# Patient Record
Sex: Male | Born: 1935
Health system: Southern US, Community
[De-identification: ages and names within clinical notes are randomized; demographics above are authoritative.]

## PROBLEM LIST (undated history)

## (undated) DIAGNOSIS — E739 Lactose intolerance, unspecified: Secondary | ICD-10-CM

## (undated) DIAGNOSIS — Z86010 Personal history of colonic polyps: Secondary | ICD-10-CM

## (undated) DIAGNOSIS — N400 Enlarged prostate without lower urinary tract symptoms: Secondary | ICD-10-CM

## (undated) DIAGNOSIS — H409 Unspecified glaucoma: Secondary | ICD-10-CM

## (undated) DIAGNOSIS — K2100 Gastro-esophageal reflux disease with esophagitis, without bleeding: Secondary | ICD-10-CM

## (undated) DIAGNOSIS — K222 Esophageal obstruction: Secondary | ICD-10-CM

## (undated) DIAGNOSIS — Z8739 Personal history of other diseases of the musculoskeletal system and connective tissue: Secondary | ICD-10-CM

## (undated) DIAGNOSIS — J189 Pneumonia, unspecified organism: Secondary | ICD-10-CM

## (undated) DIAGNOSIS — I10 Essential (primary) hypertension: Secondary | ICD-10-CM

## (undated) DIAGNOSIS — K449 Diaphragmatic hernia without obstruction or gangrene: Secondary | ICD-10-CM

## (undated) DIAGNOSIS — I509 Heart failure, unspecified: Secondary | ICD-10-CM

## (undated) DIAGNOSIS — I4891 Unspecified atrial fibrillation: Secondary | ICD-10-CM

## (undated) DIAGNOSIS — K589 Irritable bowel syndrome without diarrhea: Secondary | ICD-10-CM

## (undated) DIAGNOSIS — E538 Deficiency of other specified B group vitamins: Secondary | ICD-10-CM

## (undated) DIAGNOSIS — K219 Gastro-esophageal reflux disease without esophagitis: Secondary | ICD-10-CM

## (undated) DIAGNOSIS — J309 Allergic rhinitis, unspecified: Secondary | ICD-10-CM

## (undated) DIAGNOSIS — E785 Hyperlipidemia, unspecified: Secondary | ICD-10-CM

## (undated) DIAGNOSIS — K7689 Other specified diseases of liver: Secondary | ICD-10-CM

## (undated) DIAGNOSIS — G4730 Sleep apnea, unspecified: Secondary | ICD-10-CM

## (undated) HISTORY — DX: Irritable bowel syndrome, unspecified: K58.9

## (undated) HISTORY — DX: Allergic rhinitis, unspecified: J30.9

## (undated) HISTORY — DX: Esophageal obstruction: K22.2

## (undated) HISTORY — DX: Personal history of colonic polyps: Z86.010

## (undated) HISTORY — DX: Gastro-esophageal reflux disease with esophagitis, without bleeding: K21.00

## (undated) HISTORY — DX: Benign prostatic hyperplasia without lower urinary tract symptoms: N40.0

## (undated) HISTORY — DX: Unspecified atrial fibrillation: I48.91

## (undated) HISTORY — DX: Diaphragmatic hernia without obstruction or gangrene: K44.9

## (undated) HISTORY — DX: Lactose intolerance, unspecified: E73.9

## (undated) HISTORY — PX: CATARACT EXTRACTION W/ INTRAOCULAR LENS IMPLANT: SHX1309

## (undated) HISTORY — DX: Gastro-esophageal reflux disease with esophagitis: K21.0

## (undated) HISTORY — DX: Essential (primary) hypertension: I10

## (undated) HISTORY — DX: Deficiency of other specified B group vitamins: E53.8

## (undated) HISTORY — PX: NASAL RECONSTRUCTION: SHX2069

## (undated) HISTORY — DX: Heart failure, unspecified: I50.9

## (undated) HISTORY — DX: Other specified diseases of liver: K76.89

---

## 1992-03-04 DIAGNOSIS — Z860100 Personal history of colon polyps, unspecified: Secondary | ICD-10-CM

## 1992-03-04 HISTORY — DX: Personal history of colonic polyps: Z86.010

## 1992-03-04 HISTORY — DX: Personal history of colon polyps, unspecified: Z86.0100

## 1998-07-15 ENCOUNTER — Ambulatory Visit (HOSPITAL_COMMUNITY): Admission: RE | Admit: 1998-07-15 | Discharge: 1998-07-15 | Payer: Self-pay | Admitting: Hematology and Oncology

## 2004-12-24 ENCOUNTER — Encounter: Admission: RE | Admit: 2004-12-24 | Discharge: 2004-12-24 | Payer: Self-pay | Admitting: Otolaryngology

## 2005-01-19 ENCOUNTER — Ambulatory Visit: Payer: Self-pay | Admitting: Gastroenterology

## 2005-01-20 ENCOUNTER — Ambulatory Visit: Payer: Self-pay | Admitting: Gastroenterology

## 2005-01-20 ENCOUNTER — Ambulatory Visit (HOSPITAL_COMMUNITY): Admission: RE | Admit: 2005-01-20 | Discharge: 2005-01-20 | Payer: Self-pay | Admitting: Gastroenterology

## 2007-08-15 ENCOUNTER — Encounter (INDEPENDENT_AMBULATORY_CARE_PROVIDER_SITE_OTHER): Payer: Self-pay | Admitting: Urology

## 2007-08-16 ENCOUNTER — Inpatient Hospital Stay (HOSPITAL_COMMUNITY): Admission: RE | Admit: 2007-08-16 | Discharge: 2007-08-17 | Payer: Self-pay | Admitting: Urology

## 2007-10-02 HISTORY — PX: TRANSURETHRAL RESECTION OF PROSTATE: SHX73

## 2007-11-15 ENCOUNTER — Emergency Department (HOSPITAL_COMMUNITY): Admission: EM | Admit: 2007-11-15 | Discharge: 2007-11-15 | Payer: Self-pay | Admitting: *Deleted

## 2009-04-14 ENCOUNTER — Encounter: Admission: RE | Admit: 2009-04-14 | Discharge: 2009-04-14 | Payer: Self-pay | Admitting: Family Medicine

## 2009-04-18 ENCOUNTER — Encounter: Admission: RE | Admit: 2009-04-18 | Discharge: 2009-04-18 | Payer: Self-pay | Admitting: Family Medicine

## 2010-02-25 ENCOUNTER — Encounter (INDEPENDENT_AMBULATORY_CARE_PROVIDER_SITE_OTHER): Payer: Self-pay | Admitting: *Deleted

## 2010-02-25 ENCOUNTER — Encounter: Payer: Self-pay | Admitting: Gastroenterology

## 2010-02-25 ENCOUNTER — Encounter: Admission: RE | Admit: 2010-02-25 | Discharge: 2010-02-25 | Payer: Self-pay | Admitting: Family Medicine

## 2010-06-16 ENCOUNTER — Encounter: Payer: Self-pay | Admitting: Gastroenterology

## 2010-07-08 ENCOUNTER — Encounter: Payer: Self-pay | Admitting: Gastroenterology

## 2010-10-14 ENCOUNTER — Encounter: Payer: Self-pay | Admitting: Gastroenterology

## 2010-11-19 DIAGNOSIS — E785 Hyperlipidemia, unspecified: Secondary | ICD-10-CM | POA: Insufficient documentation

## 2010-11-19 DIAGNOSIS — I119 Hypertensive heart disease without heart failure: Secondary | ICD-10-CM | POA: Insufficient documentation

## 2010-11-19 DIAGNOSIS — K2100 Gastro-esophageal reflux disease with esophagitis, without bleeding: Secondary | ICD-10-CM | POA: Insufficient documentation

## 2010-11-19 DIAGNOSIS — J309 Allergic rhinitis, unspecified: Secondary | ICD-10-CM | POA: Insufficient documentation

## 2010-11-19 DIAGNOSIS — K589 Irritable bowel syndrome without diarrhea: Secondary | ICD-10-CM | POA: Insufficient documentation

## 2010-11-19 DIAGNOSIS — I110 Hypertensive heart disease with heart failure: Secondary | ICD-10-CM | POA: Insufficient documentation

## 2010-11-20 ENCOUNTER — Encounter (INDEPENDENT_AMBULATORY_CARE_PROVIDER_SITE_OTHER): Payer: Self-pay | Admitting: *Deleted

## 2010-11-20 ENCOUNTER — Other Ambulatory Visit: Payer: Self-pay | Admitting: Gastroenterology

## 2010-11-20 ENCOUNTER — Ambulatory Visit
Admission: RE | Admit: 2010-11-20 | Discharge: 2010-11-20 | Payer: Self-pay | Source: Home / Self Care | Attending: Gastroenterology | Admitting: Gastroenterology

## 2010-11-20 DIAGNOSIS — Z860100 Personal history of colon polyps, unspecified: Secondary | ICD-10-CM | POA: Insufficient documentation

## 2010-11-20 LAB — BASIC METABOLIC PANEL
BUN: 8 mg/dL (ref 6–23)
CO2: 34 mEq/L — ABNORMAL HIGH (ref 19–32)
Calcium: 9.3 mg/dL (ref 8.4–10.5)
Chloride: 106 mEq/L (ref 96–112)
Creatinine, Ser: 1 mg/dL (ref 0.4–1.5)
GFR: 93.77 mL/min (ref >60.00)
Glucose, Bld: 73 mg/dL (ref 70–99)
Potassium: 4.1 mEq/L (ref 3.5–5.1)
Sodium: 144 mEq/L (ref 135–145)

## 2010-11-20 LAB — IBC PANEL
Iron: 150 ug/dL (ref 42–165)
Saturation Ratios: 50 % (ref 20.0–50.0)
Transferrin: 214.3 mg/dL (ref 212.0–360.0)

## 2010-11-20 LAB — CBC WITH DIFFERENTIAL/PLATELET
Basophils Absolute: 0 10*3/uL (ref 0.0–0.1)
Basophils Relative: 0.3 % (ref 0.0–3.0)
Eosinophils Absolute: 0.1 10*3/uL (ref 0.0–0.7)
Eosinophils Relative: 1.7 % (ref 0.0–5.0)
HCT: 45.7 % (ref 39.0–52.0)
Hemoglobin: 15.6 g/dL (ref 13.0–17.0)
Lymphocytes Relative: 33.1 % (ref 12.0–46.0)
Lymphs Abs: 1.3 10*3/uL (ref 0.7–4.0)
MCHC: 34.2 g/dL (ref 30.0–36.0)
MCV: 88.3 fl (ref 78.0–100.0)
Monocytes Absolute: 0.4 10*3/uL (ref 0.1–1.0)
Monocytes Relative: 9.7 % (ref 3.0–12.0)
Neutro Abs: 2.1 10*3/uL (ref 1.4–7.7)
Neutrophils Relative %: 55.2 % (ref 43.0–77.0)
Platelets: 160 10*3/uL (ref 150.0–400.0)
RBC: 5.18 Mil/uL (ref 4.22–5.81)
RDW: 12.3 % (ref 11.5–14.6)
WBC: 3.8 10*3/uL — ABNORMAL LOW (ref 4.5–10.5)

## 2010-11-20 LAB — SEDIMENTATION RATE: Sed Rate: 5 mm/hr (ref 0–22)

## 2010-11-20 LAB — LIPASE: Lipase: 25 U/L (ref 11.0–59.0)

## 2010-11-20 LAB — HEPATIC FUNCTION PANEL
ALT: 15 U/L (ref 0–53)
AST: 17 U/L (ref 0–37)
Albumin: 3.9 g/dL (ref 3.5–5.2)
Alkaline Phosphatase: 68 U/L (ref 39–117)
Bilirubin, Direct: 0.2 mg/dL (ref 0.0–0.3)
Total Bilirubin: 1.2 mg/dL (ref 0.3–1.2)
Total Protein: 6.6 g/dL (ref 6.0–8.3)

## 2010-11-20 LAB — FOLATE: Folate: 14.9 ng/mL (ref >5.9)

## 2010-11-20 LAB — AMYLASE: Amylase: 55 U/L (ref 27–131)

## 2010-11-20 LAB — FERRITIN: Ferritin: 212.3 ng/mL (ref 22.0–322.0)

## 2010-11-20 LAB — VITAMIN B12: Vitamin B-12: 127 pg/mL — ABNORMAL LOW (ref 211–911)

## 2010-11-20 LAB — TSH: TSH: 1.01 u[IU]/mL (ref 0.35–5.50)

## 2010-11-20 LAB — H. PYLORI ANTIBODY, IGG: H Pylori IgG: NEGATIVE

## 2010-11-20 LAB — MAGNESIUM: Magnesium: 2 mg/dL (ref 1.5–2.5)

## 2010-11-23 ENCOUNTER — Encounter: Payer: Self-pay | Admitting: Family Medicine

## 2010-11-23 DIAGNOSIS — E538 Deficiency of other specified B group vitamins: Secondary | ICD-10-CM | POA: Insufficient documentation

## 2010-11-24 ENCOUNTER — Ambulatory Visit
Admission: RE | Admit: 2010-11-24 | Discharge: 2010-11-24 | Payer: Self-pay | Source: Home / Self Care | Attending: Gastroenterology | Admitting: Gastroenterology

## 2010-11-27 ENCOUNTER — Ambulatory Visit
Admission: RE | Admit: 2010-11-27 | Discharge: 2010-11-27 | Payer: Self-pay | Source: Home / Self Care | Attending: Gastroenterology | Admitting: Gastroenterology

## 2010-11-27 ENCOUNTER — Encounter: Payer: Self-pay | Admitting: Gastroenterology

## 2010-12-03 NOTE — Letter (Signed)
Summary: New Patient letter  [facility]  [address] [place], [place] [address]   Phone: [phone]  Fax: [phone]       10/14/2010 MRN: [id number]  [facility] [address] [place], [place]  [address]  Dear Ronald [name],  Welcome to the Gastroenterology Division at [facility].    You are scheduled to see Dr.  Jarold Motto on 11-20-2010 at 9am on the 3rd floor at [facility], [address]  We ask that you try to arrive at our office 15 minutes prior to your appointment time to allow for check-in.  We would like you to complete the enclosed self-administered evaluation form prior to your visit and bring it with you on the day of your appointment.  We will review it with you.  Also, please bring a complete list of all your medications or, if you prefer, bring the medication bottles and we will list them.  Please bring your insurance card so that we may make a copy of it.  If your insurance requires a referral to see a specialist, please bring your referral form from your primary care physician.  Co-payments are due at the time of your visit and may be paid by cash, check or credit card.     Your office visit will consist of a consult with your physician (includes a physical exam), any laboratory testing he/she may order, scheduling of any necessary diagnostic testing (e.g. x-ray, ultrasound, CT-scan), and scheduling of a procedure (e.g. Endoscopy, Colonoscopy) if required.  Please allow enough time on your schedule to allow for any/all of these possibilities.    If you cannot keep your appointment, please call [phone] to cancel or reschedule prior to your appointment date.  This allows [place] the opportunity to schedule an appointment for another patient in need of care.  If you do not cancel or reschedule by 5 p.m. the business day prior to your appointment date, you will be charged a $50.00 late cancellation/no-show fee.    Thank you for choosing [facility] for your medical needs.  We appreciate the opportunity to care for you.  Please visit [place] at our website  to learn more about our practice.                     Sincerely,                                                             The Gastroenterology Division

## 2010-12-03 NOTE — Procedures (Signed)
Summary: Colonoscopy   Colonoscopy  Procedure date:  01/20/2005  Findings:      Location:  [facility].    Procedures Next Due Date:    Colonoscopy: 01/2010 Patient Name: Ronald [name], Ronald [name] MRN: [id number] Procedure Procedures: Colonoscopy CPT: 62130.  Personnel: Endoscopist: Vania Rea. Jarold Motto, MD.  Exam Location: Exam performed in Endoscopy Suite.  Patient Consent: Procedure, Alternatives, Risks and Benefits discussed, consent obtained,  Indications  Surveillance of: Adenomatous Polyp(s).  History  Current Medications: Patient is not currently taking Coumadin.  Pre-Exam Physical: Performed Jan 20, 2005. Cardio-pulmonary exam, Rectal exam, Abdominal exam, Extremity exam, Mental status exam WNL.  Exam Exam: Extent of exam reached: Cecum, extent intended: Cecum.  The cecum was identified by appendiceal orifice and IC valve. Patient position: on left side. Duration of exam: 20 minutes. Colon retroflexion performed. Images taken. ASA Classification: I. Tolerance: excellent.  Monitoring: Pulse and BP monitoring, Oximetry used. Supplemental O2 given. at 2 Liters.  Colon Prep Used Golytely for colon prep. Prep results: excellent.  Fluoroscopy: Fluoroscopy was not used.  Sedation Meds: Fentanyl 50 mcg. given IV. Versed 5 mg. given IV.  Instrument(s): PCF 140L. Serial [id number].  Findings - NORMAL EXAM: Cecum to Rectum. Not Seen: Polyps. AVM's. Colitis. Tumors. Melanosis. Crohn's. Diverticulosis. Hemorrhoids.   Assessment Normal examination.  Events  Unplanned Interventions: No intervention was required.  Plans Medication Plan: Referring provider to order medications.  Patient Education: Patient given standard instructions for: Patient instructed to get routine colonoscopy every 5 years.  Disposition: After procedure patient sent to recovery.  Scheduling/Referral: Follow-Up prn.    CC: Delorse Lek, MD  This report was created  from the original endoscopy report, which was reviewed and signed by the above listed endoscopist.

## 2010-12-03 NOTE — Letter (Signed)
Summary: [facility] Instructions  [facility]  [address] [place], [place] [address]   Phone: [phone]  Fax: [phone]       DEVAUN HERNANDEZ    [birth year]    MRN: [id number]        Procedure Day /Date: Friday 11/27/2010     Arrival Time: 1:30pm     Procedure Time: 2:30pm     Location of Procedure:                    X  [facility] (4th Floor)   PREPARATION FOR COLONOSCOPY WITH MOVIPREP   Starting 5 days prior to your procedure 11/22/2010 do not eat nuts, seeds, popcorn, corn, beans, peas,  salads, or any raw vegetables.  Do not take any fiber supplements (e.g. Metamucil, Citrucel, and Benefiber).  THE DAY BEFORE YOUR PROCEDURE        Thursday 11/26/2010  1.  Drink clear liquids the entire day-NO SOLID FOOD  2.  Do not drink anything colored red or purple.  Avoid juices with pulp.  No orange juice.  3.  Drink at least 64 oz. (8 glasses) of fluid/clear liquids during the day to prevent dehydration and help the prep work efficiently.  CLEAR LIQUIDS INCLUDE: Water Jello Ice Popsicles Tea (sugar ok, no milk/cream) Powdered fruit flavored drinks Coffee (sugar ok, no milk/cream) Gatorade Juice: apple, white grape, white cranberry  Lemonade Clear bullion, consomm, broth Carbonated beverages (any kind) Strained chicken noodle soup Hard Candy                             4.  In the morning, mix first dose of MoviPrep solution:    Empty 1 Pouch A and 1 Pouch B into the disposable container    Add lukewarm drinking water to the top line of the container. Mix to dissolve    Refrigerate (mixed solution should be used within 24 hrs)  5.  Begin drinking the prep at 5:00 p.m. The MoviPrep container is divided by 4 marks.   Every 15 minutes drink the solution down to the next mark (approximately 8 oz) until the full liter is complete.   6.  Follow completed prep with 16 oz of clear liquid of your choice (Nothing red or purple).  Continue to drink  clear liquids until bedtime.  7.  Before going to bed, mix second dose of MoviPrep solution:    Empty 1 Pouch A and 1 Pouch B into the disposable container    Add lukewarm drinking water to the top line of the container. Mix to dissolve    Refrigerate  THE DAY OF YOUR PROCEDURE     Friday 11/27/2010  Beginning at 9:30am (5 hours before procedure):         1. Every 15 minutes, drink the solution down to the next mark (approx 8 oz) until the full liter is complete.  2. Follow completed prep with 16 oz. of clear liquid of your choice.    3. You may drink clear liquids until 12:30am (2 HOURS BEFORE PROCEDURE).   MEDICATION INSTRUCTIONS  Unless otherwise instructed, you should take regular prescription medications with a small sip of water   as early as possible the morning of your procedure.          OTHER INSTRUCTIONS  You will need a responsible adult at least 75 years of age to accompany you and drive you home.  This person must remain in the waiting room during your procedure.  Wear loose fitting clothing that is easily removed.  Leave jewelry and other valuables at home.  However, you may wish to bring a book to read or  an iPod/MP3 player to listen to music as you wait for your procedure to start.  Remove all body piercing jewelry and leave at home.  Total time from sign-in until discharge is approximately 2-3 hours.  You should go home directly after your procedure and rest.  You can resume normal activities the  day after your procedure.  The day of your procedure you should not:   Drive   Make legal decisions   Operate machinery   Drink alcohol   Return to work  You will receive specific instructions about eating, activities and medications before you leave.    The above instructions have been reviewed and explained to me by   _______________________    I fully understand and can verbalize these instructions _____________________________ Date  _________

## 2010-12-03 NOTE — Assessment & Plan Note (Signed)
Summary: IBS,INCREASED GAS & NAUSEA/YF   History of Present Illness Visit Type: Initial Consult Primary GI MD: Sheryn Bison MD FACP FAGA Primary Provider: Warrick Parisian, MD Requesting Provider: Warrick Parisian, MD Chief Complaint: Increase in gas, bloating, reflux symptoms x 3months. Pt states he has a little nagging discomfort in right side of the abdomen and ribs. Pt has a sour taste in his mouth.  History of Present Illness:   77 year old [place] American male that I have followed for many years 77 year-old bowel syndrome. He now is having a relapse of his problems with crampy lower abdominal pain, gas and bloating unresponsive to Dexilant 60 mg a day. Recent evaluation by Dr. Creta Levin showed normal labs and upper abdominal ultrasound exam.  Tailor has mild lactose intolerance. He denies sorbitol or fructose use. His bowels are fairly regular without melena or hematochezia. He has had no anorexia, weight loss, fever or chills. I cannot elicit any upper GI complaints at this time. He specifically denies acid reflux, dysphagia, or any history of hepatitis or pancreatitis. Review his ultrasound shows normal abdominal structures. Liver function test were normal. He does have mild essential hypertension and hyperlipidemia.  He has a history of adenomatous polyps going back some 20 years. Last colonoscopy was 5 years ago and was entirely normal.   GI Review of Systems    Reports abdominal pain, acid reflux, belching, and  bloating.     Location of  Abdominal pain: right side.    Denies chest pain, dysphagia with liquids, dysphagia with solids, heartburn, loss of appetite, nausea, vomiting, vomiting blood, weight loss, and  weight gain.        Denies anal fissure, black tarry stools, change in bowel habit, constipation, diarrhea, diverticulosis, fecal incontinence, heme positive stool, hemorrhoids, irritable bowel syndrome, jaundice, light color stool, liver problems, rectal bleeding, and   rectal pain.    Current Medications (verified): 1)  Atenolol 50 Mg Tabs (Atenolol) .... Take One By Mouth Once Daily 2)  Potassium Chloride Crys Cr 20 Meq Cr-Tabs (Potassium Chloride Crys Cr) .... Take One By Mouth Once Daily 3)  Multivitamins  Tabs (Multiple Vitamin) .... Take One By Mouth Once Daily 4)  Amlodipine Besylate 5 Mg Tabs (Amlodipine Besylate) .... Take One By Mouth Once Daily 5)  Dexilant 60 Mg Cpdr (Dexlansoprazole) .... Take One By Mouth Once Daily  Allergies (verified): No Known Drug Allergies  Past History:  Past medical, surgical, family and social histories (including risk factors) reviewed for relevance to current acute and chronic problems.  Past Medical History: IRRITABLE BOWEL SYNDROME (ICD-564.1) HYPERLIPIDEMIA (ICD-272.4) REFLUX ESOPHAGITIS (ICD-530.11) HYPERTENSION, BENIGN (ICD-401.1) ALLERGIC RHINITIS (ICD-477.9)  Past Surgical History: Reviewed history from 11/19/2010 and no changes required. nose surgery prostate surgery  Family History: Reviewed history from 11/19/2010 and no changes required. Unremarkable No FH of Colon Cancer:  Social History: Reviewed history from 11/19/2010 and no changes required. Married Patient has never smoked.  Alcohol Use - yes Patient gets regular exercise.  Review of Systems       The patient complains of allergy/sinus, arthritis/joint pain, back pain, cough, night sweats, and swelling of feet/legs.  The patient denies anemia, anxiety-new, blood in urine, breast changes/lumps, change in vision, confusion, coughing up blood, depression-new, fainting, fatigue, fever, headaches-new, hearing problems, heart murmur, heart rhythm changes, itching, menstrual pain, muscle pains/cramps, nosebleeds, pregnancy symptoms, shortness of breath, skin rash, sleeping problems, sore throat, swollen lymph glands, thirst - excessive , urination - excessive , urination changes/pain, urine leakage, vision  changes, and voice change.          Vague periodic right upper quadrant discomfort noted without any definite hepatobiliary complaints. He denies use of NSAIDs or aspirin.  Vital Signs:  Patient profile:   77 year old male Height:      74 inches Weight:      223.25 pounds BMI:     28.77 Pulse rate:   64 / minute Pulse rhythm:   regular BP sitting:   138 / 78  (left arm) Cuff size:   regular  Vitals Entered By: Christie Nottingham CMA Duncan Dull) (November 20, 2010 8:44 AM)  Physical Exam  General:  Well developed, well nourished, no acute distress.healthy appearing.   Head:  Normocephalic and atraumatic. Eyes:  PERRLA, no icterus.exam deferred to patient's ophthalmologist.   Neck:  Supple; no masses or thyromegaly. Lungs:  Clear throughout to auscultation. Heart:  Regular rate and rhythm; no murmurs, rubs,  or bruits. Abdomen:  Soft, nontender and nondistended. No masses, hepatosplenomegaly or hernias noted. Normal bowel sounds. Rectal:  deferred until time of colonoscopy.   Prostate:  Exam deferred until time of colonoscopy.   Msk:  Symmetrical with no gross deformities. Normal posture. Extremities:  No clubbing, cyanosis, edema or deformities noted. Neurologic:  Alert and  oriented x4;  grossly normal neurologically. Cervical Nodes:  No significant cervical adenopathy. Psych:  Alert and cooperative. Normal mood and affect.   Impression & Recommendations:  Problem # 1:  COLONIC POLYPS, HX OF (ICD-V12.72) Assessment Unchanged Colonoscopy scheduled at his convenience. Orders: TLB-CBC Platelet - w/Differential (85025-CBCD) TLB-BMP (Basic Metabolic Panel-BMET) (80048-METABOL) TLB-Hepatic/Liver Function Pnl (80076-HEPATIC) TLB-TSH (Thyroid Stimulating Hormone) (84443-TSH) TLB-B12, Serum-Total ONLY ([address]-U04) TLB-Ferritin (82728-FER) TLB-Folic Acid (Folate) (82746-FOL) TLB-IBC Pnl (Iron/FE;Transferrin) (83550-IBC) TLB-Amylase (82150-AMYL) TLB-Lipase (83690-LIPASE) TLB-H. Pylori Abs(Helicobacter Pylori)  (86677-HELICO) TLB-Magnesium (Mg) (83735-MG) TLB-Sedimentation Rate (ESR) (85652-ESR) Colon/Endo (Colon/Endo)  Problem # 2:  IRRITABLE BOWEL SYNDROME (ICD-564.1) Assessment: Deteriorated Trial of Librax one p.o. t.i.d. a.c. Also trial of probiotic therapy with daily Align.He has been on Dexilant for several weeks without improvement and we will discontinue this PPI therapy. Labs pending. Orders: TLB-CBC Platelet - w/Differential (85025-CBCD) TLB-BMP (Basic Metabolic Panel-BMET) (80048-METABOL) TLB-Hepatic/Liver Function Pnl (80076-HEPATIC) TLB-TSH (Thyroid Stimulating Hormone) (84443-TSH) TLB-B12, Serum-Total ONLY (54098-J19) TLB-Ferritin (82728-FER) TLB-Folic Acid (Folate) (82746-FOL) TLB-IBC Pnl (Iron/FE;Transferrin) (83550-IBC) TLB-Amylase (82150-AMYL) TLB-Lipase (83690-LIPASE) TLB-H. Pylori Abs(Helicobacter Pylori) (86677-HELICO) TLB-Magnesium (Mg) (83735-MG) TLB-Sedimentation Rate (ESR) (85652-ESR) Colon/Endo (Colon/Endo)  Problem # 3:  HYPERTENSION, BENIGN (ICD-401.1) Assessment: Improved blood pressure That today normal at 138/78. His continue other medications per primary care.  Patient Instructions: 1)  Copy sent to : Warrick Parisian, MD 2)  Your procedure has been scheduled for 11/27/2010, please follow the seperate instructions.  3)  [facility] Patient Information Guide given to patient.  4)  Colonoscopy and Flexible Sigmoidoscopy brochure given.  5)  Stop your Dexilant. 6)  Your prescription(s) have been sent to you pharmacy.  7)  Please go to the basement today for your labs.  8)  The medication list was reviewed and reconciled.  All changed / newly prescribed medications were explained.  A complete medication list was provided to the patient / caregiver. Prescriptions: MOVIPREP 100 GM  SOLR (PEG-KCL-NACL-NASULF-NA ASC-C) As per prep instructions.  #1 x 0   Entered by:   Harlow Mares CMA (AAMA)   Authorized by:   Mardella Layman MD [facility]   Signed  by:   Harlow Mares CMA (AAMA) on 11/20/2010   Method used:   Electronically  to        [facility] [address]* (retail)       [address]       [place], [place]  [address]       Ph: [phone]       Fax: [phone]   RxID:   [id number] LIBRAX 2.5-5 MG CAPS (CLIDINIUM-CHLORDIAZEPOXIDE) take one by mouth three times a day before meals  #90 x 3   Entered by:   Harlow Mares CMA (AAMA)   Authorized by:   Mardella Layman MD [facility]   Signed by:   Harlow Mares CMA (AAMA) on 11/20/2010   Method used:   Electronically to        [facility]* (retail)       [address]       [place], [place]  [address]       Ph: [phone]       Fax: [phone]   RxID:   [id number]

## 2010-12-03 NOTE — Assessment & Plan Note (Signed)
Summary: 1 of 3 b12 injections/lk  Nurse Visit   Allergies: No Known Drug Allergies  Medication Administration  Injection # 1:    Medication: Vit B12 1000 mcg    Diagnosis: VITAMIN B12 DEFICIENCY (ICD-266.2)    Route: IM    Site: L deltoid    Exp Date: 08/01/2012    Lot #: [id number]    Mfr: American Regent    Comments: #1 of 3 B 12 injections    Patient tolerated injection without complications    Given by: Jesse Fall RN (November 24, 2010 9:10 AM)  Orders Added: 1)  Vit B12 1000 mcg [J3420]

## 2010-12-03 NOTE — Procedures (Signed)
Summary: Endoscopy   EGD  Procedure date:  01/20/2005  Findings:      Location:  Endoscopy Center   Patient Name: Ronald [name], Ronald [name] MRN: [id number] Procedure Procedures: Panendoscopy (EGD) CPT: 43235.    with biopsy(s)/brushing(s). CPT: D1846139.  Personnel: Endoscopist: Vania Rea. Jarold Motto, MD.  Exam Location: Exam performed in Endoscopy Suite.  Patient Consent: Procedure, Alternatives, Risks and Benefits discussed, consent obtained,  Indications Symptoms: Reflux symptoms for >10 yrs, occurring 3-6 times/wk.  History  Current Medications: Patient is not currently taking Coumadin.  Pre-Exam Physical: Performed Jan 20, 2005  Cardio-pulmonary exam, Abdominal exam, Extremity exam, Mental status exam WNL.  Exam Exam Info: Maximum depth of insertion Duodenum, intended Duodenum. Patient position: on left side. Duration of exam: 15 minutes. Vocal cords visualized. Gastric retroflexion performed. Images taken. ASA Classification: I. Tolerance: excellent.  Sedation Meds: Cetacaine Spray 2 sprays given aerosolized. Fentanyl 12.5 given IV. Versed 1 mg. given IV.  Monitoring: BP and pulse monitoring done. Oximetry used. Supplemental O2 given at 2 Liters.  Fluoroscopy: Fluoroscopy was not used.  Instrument(s): GIF 160. Serial [id number].   Findings - Normal: Proximal Esophagus to Distal Esophagus. Not Seen: Tumor. Barrett's esophagus. Esophageal inflammation. Mucosal abnormality. Stricture. Varices.  - Normal: Fundus to Duodenal 2nd Portion. Tumor. Ulcer. Mucosal abnormality. Foreign body.  - DIAGNOSTIC TEST: from Antrum. RUT done, results pending   Assessment Normal examination.  Events  Unplanned Intervention: No unplanned interventions were required.  Plans Medication(s): Await pathology. Continue current medications.  Disposition: After procedure patient sent to recovery.  Scheduling: Follow-up prn.   CC: Delorse Lek, MD  This report was  created from the original endoscopy report, which was reviewed and signed by the above listed endoscopist.    RUT=NEG

## 2010-12-03 NOTE — Letter (Signed)
[facility]   Imported By: Sherian Rein 11/26/2010 07:25:31  _____________________________________________________________________  External Attachment:    Type:   Image     Comment:   External Document

## 2010-12-03 NOTE — Procedures (Addendum)
Summary: Colonoscopy  Patient: Giovoni Bunch Note: All result statuses are Final unless otherwise noted.  Tests: (1) Colonoscopy (COL)   COL Colonoscopy           DONE     [facility]     [address]     [place], [place]  [address]           COLONOSCOPY PROCEDURE REPORT           PATIENT:  Ronald [name], Ronald [name]  MR#:  [id number]     BIRTHDATE:  May 16, 1934, 76 yrs. old  GENDER:  male     ENDOSCOPIST:  Vania Rea. Jarold Motto, MD, [facility]     REF. BY:  Talbot Grumbling. Creta Levin, M.D.     PROCEDURE DATE:  11/27/2010     PROCEDURE:  Average-risk screening colonoscopy     G0121     ASA CLASS:  Class II     INDICATIONS:  Abdominal pain, Routine Risk Screening, history of     pre-cancerous (adenomatous) colon polyps     MEDICATIONS:   Fentanyl 25 mcg IV, Versed 4 mg IV           DESCRIPTION OF PROCEDURE:   After the risks benefits and     alternatives of the procedure were thoroughly explained, informed     consent was obtained.  Digital rectal exam was performed and     revealed no abnormalities.   The LB CF-[id number] endoscope     was introduced through the anus and advanced to the cecum, which     was identified by both the appendix and ileocecal valve, without     limitations.  The quality of the prep was excellent, using     MoviPrep.  The instrument was then slowly withdrawn as the colon     was fully examined.     <<PROCEDUREIMAGES>>           FINDINGS:  No polyps or cancers were seen.  This was otherwise a     normal examination of the colon.   Retroflexed views in the rectum     revealed no abnormalities.    The scope was then withdrawn from     the patient and the procedure completed.           COMPLICATIONS:  None     ENDOSCOPIC IMPRESSION:     1) No polyps or cancers     2) Otherwise normal examination     CHRONIC IBS.LACTOSE INTOLERANCE.     RECOMMENDATIONS:     1) Continue current medications     REPEAT EXAM:  No           ______________________________     Vania Rea. Jarold Motto, MD, [facility]           CC:           n.     eSIGNED:   Vania Rea. Patterson at 11/27/2010 02:55 PM           Fanny Bien, [id number]  Note: An exclamation mark (!) indicates a result that was not dispersed into the flowsheet. Document Creation Date: 11/27/2010 2:55 PM _______________________________________________________________________  (1) Order result status: Final Collection or observation date-time: 11/27/2010 14:48 Requested date-time:  Receipt date-time:  Reported date-time:  Referring Physician:   Ordering Physician: Sheryn Bison [phone]) Specimen Source:  Source: Launa Grill Order Number: [id number] Lab site:

## 2010-12-25 ENCOUNTER — Encounter: Payer: Self-pay | Admitting: Gastroenterology

## 2010-12-25 ENCOUNTER — Encounter (INDEPENDENT_AMBULATORY_CARE_PROVIDER_SITE_OTHER): Payer: PRIVATE HEALTH INSURANCE

## 2010-12-25 DIAGNOSIS — E538 Deficiency of other specified B group vitamins: Secondary | ICD-10-CM

## 2010-12-29 NOTE — Assessment & Plan Note (Signed)
Summary: MONTHLY B12 SHOT  Nurse Visit   Allergies: No Known Drug Allergies  Medication Administration  Injection # 1:    Medication: Vit B12 1000 mcg    Diagnosis: VITAMIN B12 DEFICIENCY (ICD-266.2)    Route: IM    Site: R deltoid    Exp Date: 09/2012    Lot #: [id number]    Mfr: American Regent    Comments: pt to schedule next monthly b12 at front desk    Patient tolerated injection without complications    Given by: Chales Abrahams CMA Duncan Dull) (December 25, 2010 9:23 AM)  Orders Added: 1)  Vit B12 1000 mcg [J3420]

## 2011-01-25 ENCOUNTER — Ambulatory Visit (INDEPENDENT_AMBULATORY_CARE_PROVIDER_SITE_OTHER): Payer: PRIVATE HEALTH INSURANCE | Admitting: Gastroenterology

## 2011-01-25 DIAGNOSIS — E538 Deficiency of other specified B group vitamins: Secondary | ICD-10-CM

## 2011-01-25 MED ORDER — CYANOCOBALAMIN 1000 MCG/ML IJ SOLN
1000.0000 ug | INTRAMUSCULAR | Status: AC
  Administered 2011-01-25 – 2011-06-28 (×6): 1000 ug via INTRAMUSCULAR

## 2011-02-25 ENCOUNTER — Ambulatory Visit (INDEPENDENT_AMBULATORY_CARE_PROVIDER_SITE_OTHER): Payer: PRIVATE HEALTH INSURANCE | Admitting: Gastroenterology

## 2011-02-25 DIAGNOSIS — E538 Deficiency of other specified B group vitamins: Secondary | ICD-10-CM

## 2011-03-16 NOTE — Op Note (Signed)
Ronald [name], Ronald [name]               ACCOUNT NO.:  [id number]   MEDICAL RECORD NO.:  [id number]          PATIENT TYPE:  AMB   LOCATION:  DAY                          FACILITY:  [facility]   PHYSICIAN:  Jamison Neighbor, M.D.  DATE OF BIRTH:  1934/09/09   DATE OF PROCEDURE:  08/15/2007  DATE OF DISCHARGE:                               OPERATIVE REPORT   PREOPERATIVE DIAGNOSIS:  Bladder outlet obstruction.   POSTOPERATIVE DIAGNOSIS:  Bladder outlet obstruction.   PROCEDURES PERFORMED:  1. Cystoscopy.  2. Transurethral resection of the prostate.   SURGEON:  Jamison Neighbor, MD   ASSISTANT:  Melina Schools, MD   ANESTHESIA:  General.   DRAINS:  22-French three-way Foley.   INDICATIONS FOR PROCEDURE:  Ronald [name] is a 77 year old male with lower  urinary tract symptoms.  These are severe such that his AUA is greater  than 17.  He has been on maximal medical therapy including an alpha-  adrenergic blocker and a 5-alpha reductase inhibitor.  He continues to  have the lower urinary tract symptoms.  Cystoscopy revealed trilobar  hypertrophy with a ball-valve median lobe.  He presents for  transurethral resection of the prostate.   DESCRIPTION OF PROCEDURE IN DETAIL:  The patient was brought to the  operating room.  He was identified by his arm band.  Informed consent  was verified and the preoperative time-out was performed.  Perioperative  antibiotics were administered.  After the successful induction of  general anesthesia with LMA, the patient was moved to the dorsal  lithotomy position, where all appropriate pressure points were padded to  avoid compression and compartment syndrome.  Sequential compression  devices were employed.  The perineum was prepped and draped in the usual  fashion.  Ronald [name] sounds were used to dilate the urethra to a caliber  of 30-French.  The resectoscopic sheath was passed via obturator.  We  then inserted the working element.  With irrigation of glycine  we  performed a cystourethroscopy.  The patient had mild lateral lobar  hypertrophy.  He had a very large protuberant median lobe that had a  ball-valve effect on the bladder neck.  There was considerable distance  between the median lobe and the bilateral ureteral orifices.  Those were  inspected and they were both seen to efflux clear urine.  The remainder  of the bladder was inspected.  It was 2+ trabeculated with several small  cellules but no frank diverticula.  The remainder of the bladder was  free of any mucosal lesions, erythema, foreign bodies, stones.  Attention was turned to the median lobe.  This was resected.  The floor  of the prostate was then resected from base to apex with great care  taken not to undermine the bladder neck.  We then proceeded with  transurethral resection of the lateral lobes.  Resection in all areas  was carried down to but not through the level of the surgical capsule.  Great care was taken to stay proximal to the verumontanum at all times.  Once the apical and lateral resections were complete, the  bladder was  irrigated until it was free of any chips.  The resectoscope element was  reinserted and the prostatic fossa was inspected.  Cautery was used  liberally to ensure hemostasis.  Once hemostasis was satisfactory, we  again inspected the orifices and the verumontanum, and there were  intact.  The sheath was removed and catheter guide was used to pass a 22-  [place] three-way catheter into the bladder.  It was placed to straight  drain and then placed on traction.  It was hand-irrigated until it was  clear.  It was then connected to continuous bladder irrigation and  straight drainage.  At this time the procedure was terminated.  The  patient tolerated the procedure well there were no complications.  Jamison Neighbor, MD, was the attending primary responsible physician and  was present and participated in all aspects.   DISPOSITION:  The patient was  awoken from general anesthetic,  transferred safely to the post anesthesia care unit in satisfactory  condition.     ______________________________  Melina Schools, MD      Jamison Neighbor, M.D.  Electronically Signed    JR/MEDQ  D:  08/15/2007  T:  08/15/2007  Job:  [id number]

## 2011-03-19 NOTE — Discharge Summary (Signed)
[name], [name]               ACCOUNT NO.:  [id number]   MEDICAL RECORD NO.:  [id number]          PATIENT TYPE:  INP   LOCATION:  1407                         FACILITY:  [facility]   PHYSICIAN:  Jamison Neighbor, M.D.  DATE OF BIRTH:  04-24-1934   DATE OF ADMISSION:  08/15/2007  DATE OF DISCHARGE:  08/17/2007                               DISCHARGE SUMMARY   ADMISSION DIAGNOSIS:  Benign prostatic hypertrophy.   DISCHARGE DIAGNOSES:  1. Benign prostatic hypertrophy.  2. Hypokalemia.   HISTORY OF PRESENT ILLNESS:  Ronald [name] is a 77 year old male with a  history of symptomatic benign prostatic hypertrophy.  He has an elevated  AUA symptom score.  He has failed medical management.  He presents for a  transurethral resection of the prostate.   HOSPITAL COURSE:  The patient was admitted to the hospital on the day of  surgery.  He was taken to the operating room where he underwent a  transurethral resection of the prostate under general endotracheal  anesthesia.  The patient tolerated the procedure well.  There were no  complications.  He was admitted to the floor postoperatively with a  three-way Foley catheter and continuous bladder irrigation.  This was  weaned off.   His hemoglobin immediately postoperatively was 13.4 and this remained  stable and was 12.6 on the following day.  The patient's potassium on  admission was 3.5.  On the following day it was noted to be 2.8.  He  underwent IV and p.o. potassium replacement.  After replacement, the  patient's potassium has risen to 3.3, for which he got additional p.o.  replacement.  On postoperative day two the patient's Foley catheter was  removed and he voided without complaints.  There was just slight blood  tinge on a string of three bottles.   DISPOSITION:  At this time the patient was stable for discharge to home.   DISCHARGE INSTRUCTIONS:   DIET:  The patient was told to resume his previous diet.   ACTIVITY:  He is up as  tolerated.   FOLLOWUP:  1. He is instructed to follow up with his primary care physician      regarding his low potassium.  2. He is to follow up with Dr. Jamison Neighbor as instructed.  The      patient indicated that he understood these instructions and was      discharged home.      Terie Purser, MD      Jamison Neighbor, M.D.  Electronically Signed    JH/MEDQ  D:  08/22/2007  T:  08/23/2007  Job:  [id number]

## 2011-03-23 ENCOUNTER — Ambulatory Visit (INDEPENDENT_AMBULATORY_CARE_PROVIDER_SITE_OTHER): Payer: PRIVATE HEALTH INSURANCE | Admitting: Gastroenterology

## 2011-03-23 DIAGNOSIS — E538 Deficiency of other specified B group vitamins: Secondary | ICD-10-CM

## 2011-04-23 ENCOUNTER — Ambulatory Visit (INDEPENDENT_AMBULATORY_CARE_PROVIDER_SITE_OTHER): Payer: PRIVATE HEALTH INSURANCE | Admitting: Gastroenterology

## 2011-04-23 DIAGNOSIS — E538 Deficiency of other specified B group vitamins: Secondary | ICD-10-CM

## 2011-05-26 ENCOUNTER — Ambulatory Visit (INDEPENDENT_AMBULATORY_CARE_PROVIDER_SITE_OTHER): Payer: PRIVATE HEALTH INSURANCE | Admitting: Gastroenterology

## 2011-05-26 DIAGNOSIS — E538 Deficiency of other specified B group vitamins: Secondary | ICD-10-CM

## 2011-06-28 ENCOUNTER — Ambulatory Visit (INDEPENDENT_AMBULATORY_CARE_PROVIDER_SITE_OTHER): Payer: PRIVATE HEALTH INSURANCE | Admitting: Gastroenterology

## 2011-06-28 DIAGNOSIS — E538 Deficiency of other specified B group vitamins: Secondary | ICD-10-CM

## 2011-07-22 LAB — BASIC METABOLIC PANEL
GFR calc Af Amer: >60
GFR calc non Af Amer: >60
Potassium: 3.2 — ABNORMAL LOW
Sodium: 141

## 2011-07-22 LAB — DIFFERENTIAL
Eosinophils Relative: 2
Lymphocytes Relative: 36
Lymphs Abs: 1.4
Monocytes Absolute: 0.3

## 2011-07-22 LAB — POCT CARDIAC MARKERS
CKMB, poc: 1.1
CKMB, poc: <1 — ABNORMAL LOW
CKMB, poc: <1 — ABNORMAL LOW
Myoglobin, poc: 67.1
Myoglobin, poc: 75.3
Troponin i, poc: <0.05

## 2011-07-22 LAB — CBC
HCT: 45.7
Hemoglobin: 16
RBC: 5.39
WBC: 3.8 — ABNORMAL LOW

## 2011-07-30 ENCOUNTER — Ambulatory Visit (INDEPENDENT_AMBULATORY_CARE_PROVIDER_SITE_OTHER): Payer: PRIVATE HEALTH INSURANCE | Admitting: Gastroenterology

## 2011-07-30 DIAGNOSIS — E538 Deficiency of other specified B group vitamins: Secondary | ICD-10-CM

## 2011-07-30 MED ORDER — CYANOCOBALAMIN 1000 MCG/ML IJ SOLN
1000.0000 ug | INTRAMUSCULAR | Status: AC
  Administered 2011-07-30 – 2011-10-01 (×3): 1000 ug via INTRAMUSCULAR

## 2011-08-11 LAB — COMPREHENSIVE METABOLIC PANEL
ALT: 11
Alkaline Phosphatase: 62
BUN: 9
CO2: 35 — ABNORMAL HIGH
Chloride: 101
GFR calc non Af Amer: >60
Glucose, Bld: 96
Potassium: 3.3 — ABNORMAL LOW
Sodium: 140
Total Bilirubin: 0.8
Total Protein: 5.1 — ABNORMAL LOW

## 2011-08-11 LAB — CBC
HCT: 37.2 — ABNORMAL LOW
Hemoglobin: 12.6 — ABNORMAL LOW
RBC: 4.25
RDW: 12.5

## 2011-08-11 LAB — BASIC METABOLIC PANEL
GFR calc Af Amer: >60
GFR calc non Af Amer: >60
Glucose, Bld: 110 — ABNORMAL HIGH
Potassium: 2.8 — ABNORMAL LOW
Sodium: 137

## 2011-08-12 LAB — BASIC METABOLIC PANEL
BUN: 8
Calcium: 8.5
Calcium: 9.3
Chloride: 101
Creatinine, Ser: 0.93
Creatinine, Ser: 0.98
GFR calc Af Amer: >60
GFR calc Af Amer: >60
GFR calc non Af Amer: >60
Sodium: 143

## 2011-08-12 LAB — CBC
MCV: 86.2
Platelets: 180
RBC: 4.55
WBC: 3.4 — ABNORMAL LOW

## 2011-08-12 LAB — HEMOGLOBIN AND HEMATOCRIT, BLOOD
HCT: 41.9
Hemoglobin: 14.3

## 2011-08-30 ENCOUNTER — Ambulatory Visit (INDEPENDENT_AMBULATORY_CARE_PROVIDER_SITE_OTHER): Payer: PRIVATE HEALTH INSURANCE | Admitting: Gastroenterology

## 2011-08-30 DIAGNOSIS — E538 Deficiency of other specified B group vitamins: Secondary | ICD-10-CM

## 2011-10-01 ENCOUNTER — Ambulatory Visit (INDEPENDENT_AMBULATORY_CARE_PROVIDER_SITE_OTHER): Payer: PRIVATE HEALTH INSURANCE | Admitting: Gastroenterology

## 2011-10-01 DIAGNOSIS — E538 Deficiency of other specified B group vitamins: Secondary | ICD-10-CM

## 2011-10-20 ENCOUNTER — Other Ambulatory Visit: Payer: Self-pay | Admitting: *Deleted

## 2011-10-20 MED ORDER — CILIDINIUM-CHLORDIAZEPOXIDE 2.5-5 MG PO CAPS: 1.0000 | ORAL_CAPSULE | Freq: Three times a day (TID) | ORAL | Status: DC

## 2011-11-01 ENCOUNTER — Ambulatory Visit (INDEPENDENT_AMBULATORY_CARE_PROVIDER_SITE_OTHER): Payer: PRIVATE HEALTH INSURANCE | Admitting: Gastroenterology

## 2011-11-01 DIAGNOSIS — E538 Deficiency of other specified B group vitamins: Secondary | ICD-10-CM

## 2011-11-01 MED ORDER — CYANOCOBALAMIN 1000 MCG/ML IJ SOLN: 1000.0000 ug | INTRAMUSCULAR | Status: AC

## 2011-12-07 ENCOUNTER — Ambulatory Visit (INDEPENDENT_AMBULATORY_CARE_PROVIDER_SITE_OTHER): Payer: PRIVATE HEALTH INSURANCE | Admitting: Gastroenterology

## 2011-12-07 DIAGNOSIS — E538 Deficiency of other specified B group vitamins: Secondary | ICD-10-CM

## 2011-12-07 MED ORDER — CYANOCOBALAMIN 1000 MCG/ML IJ SOLN
1000.0000 ug | INTRAMUSCULAR | Status: DC
  Administered 2011-12-07 – 2012-12-04 (×10): 1000 ug via INTRAMUSCULAR

## 2012-01-11 ENCOUNTER — Ambulatory Visit (INDEPENDENT_AMBULATORY_CARE_PROVIDER_SITE_OTHER): Payer: PRIVATE HEALTH INSURANCE | Admitting: Gastroenterology

## 2012-01-11 DIAGNOSIS — E538 Deficiency of other specified B group vitamins: Secondary | ICD-10-CM

## 2012-01-11 NOTE — Progress Notes (Signed)
b12 given

## 2012-01-24 ENCOUNTER — Encounter: Payer: Self-pay | Admitting: *Deleted

## 2012-01-27 ENCOUNTER — Encounter: Payer: Self-pay | Admitting: Gastroenterology

## 2012-01-27 ENCOUNTER — Ambulatory Visit (INDEPENDENT_AMBULATORY_CARE_PROVIDER_SITE_OTHER): Payer: PRIVATE HEALTH INSURANCE | Admitting: Gastroenterology

## 2012-01-27 VITALS — BP 160/64 | HR 60 | Ht 74.0 in | Wt 226.2 lb

## 2012-01-27 DIAGNOSIS — K219 Gastro-esophageal reflux disease without esophagitis: Secondary | ICD-10-CM

## 2012-01-27 DIAGNOSIS — K589 Irritable bowel syndrome without diarrhea: Secondary | ICD-10-CM | POA: Insufficient documentation

## 2012-01-27 DIAGNOSIS — E538 Deficiency of other specified B group vitamins: Secondary | ICD-10-CM

## 2012-01-27 NOTE — Patient Instructions (Signed)
We will contact you when the B12 comes in from the manufacturer.  Contact your pharmacy when you need refills on your medications.  Follow up in one year.

## 2012-01-27 NOTE — Progress Notes (Signed)
This is a 78 year old African American male with chronic IBS and acid reflux. He is currently asymptomatic. He perceives his primary care at the [facility], and apparently has had normal recent labs. He specifically denies reflux symptoms, dysphagia, melena, hematochezia, or any gastrointestinal symptoms. His appetite is good his weight is stable. He does have mild hypertension, is on antihypertensive medication. He is up-to-date on his colonoscopy exams.  Current Medications, Allergies, Past Medical History, Past Surgical History, Family History and Social History were reviewed in [employer] record.  Pertinent Review of Systems Negative   Physical Exam: Blood pressure 160/64. Pulse is 60 and regular, weight 226 pounds with BMI of 29.5. Not appreciated stigmata of chronic liver disease. Chest is clear and he is in regular rhythm without murmurs gallops or rubs. There is no organomegaly, abdominal masses or tenderness. Bowel sounds are normal. Mental status is normal, peripheral extremities are unremarkable.    Assessment and Plan: Acid reflux under good control with Dexilant 60 mg a day. He has chronic IBS and I have renewed his Librax. He will see [place] on a yearly basis or when necessary as needed. Followup with primary care for his essential hypertension. The patient denies any cardiovascular or pulmonary complaints today. He is on parenteral B12 replacement therapy. No diagnosis found.

## 2012-02-23 ENCOUNTER — Telehealth: Payer: Self-pay | Admitting: Gastroenterology

## 2012-02-23 NOTE — Telephone Encounter (Signed)
Ok to make appt for nurse visit for b12

## 2012-02-28 ENCOUNTER — Ambulatory Visit (INDEPENDENT_AMBULATORY_CARE_PROVIDER_SITE_OTHER): Payer: PRIVATE HEALTH INSURANCE | Admitting: Gastroenterology

## 2012-02-28 DIAGNOSIS — E538 Deficiency of other specified B group vitamins: Secondary | ICD-10-CM

## 2012-03-30 ENCOUNTER — Ambulatory Visit (INDEPENDENT_AMBULATORY_CARE_PROVIDER_SITE_OTHER): Payer: PRIVATE HEALTH INSURANCE | Admitting: Gastroenterology

## 2012-03-30 DIAGNOSIS — E538 Deficiency of other specified B group vitamins: Secondary | ICD-10-CM

## 2012-05-01 ENCOUNTER — Encounter: Payer: PRIVATE HEALTH INSURANCE | Admitting: Gastroenterology

## 2012-06-02 ENCOUNTER — Ambulatory Visit (INDEPENDENT_AMBULATORY_CARE_PROVIDER_SITE_OTHER): Payer: PRIVATE HEALTH INSURANCE | Admitting: Gastroenterology

## 2012-06-02 DIAGNOSIS — E538 Deficiency of other specified B group vitamins: Secondary | ICD-10-CM

## 2012-06-30 ENCOUNTER — Ambulatory Visit (INDEPENDENT_AMBULATORY_CARE_PROVIDER_SITE_OTHER): Payer: PRIVATE HEALTH INSURANCE | Admitting: Gastroenterology

## 2012-06-30 DIAGNOSIS — E538 Deficiency of other specified B group vitamins: Secondary | ICD-10-CM

## 2012-07-12 DIAGNOSIS — N400 Enlarged prostate without lower urinary tract symptoms: Secondary | ICD-10-CM | POA: Insufficient documentation

## 2012-07-31 ENCOUNTER — Ambulatory Visit (INDEPENDENT_AMBULATORY_CARE_PROVIDER_SITE_OTHER): Payer: PRIVATE HEALTH INSURANCE | Admitting: Gastroenterology

## 2012-07-31 DIAGNOSIS — E538 Deficiency of other specified B group vitamins: Secondary | ICD-10-CM

## 2012-07-31 MED ORDER — CYANOCOBALAMIN 1000 MCG/ML IJ SOLN
1000.0000 ug | INTRAMUSCULAR | Status: DC
  Administered 2012-07-31: 1000 ug via INTRAMUSCULAR

## 2012-08-31 ENCOUNTER — Ambulatory Visit (INDEPENDENT_AMBULATORY_CARE_PROVIDER_SITE_OTHER): Payer: PRIVATE HEALTH INSURANCE | Admitting: Gastroenterology

## 2012-08-31 DIAGNOSIS — E538 Deficiency of other specified B group vitamins: Secondary | ICD-10-CM

## 2012-10-03 ENCOUNTER — Ambulatory Visit (INDEPENDENT_AMBULATORY_CARE_PROVIDER_SITE_OTHER): Payer: PRIVATE HEALTH INSURANCE | Admitting: Gastroenterology

## 2012-10-03 DIAGNOSIS — E538 Deficiency of other specified B group vitamins: Secondary | ICD-10-CM

## 2012-11-01 DIAGNOSIS — K222 Esophageal obstruction: Secondary | ICD-10-CM

## 2012-11-01 DIAGNOSIS — K449 Diaphragmatic hernia without obstruction or gangrene: Secondary | ICD-10-CM

## 2012-11-01 HISTORY — DX: Esophageal obstruction: K22.2

## 2012-11-01 HISTORY — DX: Diaphragmatic hernia without obstruction or gangrene: K44.9

## 2012-11-03 ENCOUNTER — Ambulatory Visit (INDEPENDENT_AMBULATORY_CARE_PROVIDER_SITE_OTHER): Payer: PRIVATE HEALTH INSURANCE | Admitting: Gastroenterology

## 2012-11-03 DIAGNOSIS — E538 Deficiency of other specified B group vitamins: Secondary | ICD-10-CM

## 2012-12-04 ENCOUNTER — Ambulatory Visit (INDEPENDENT_AMBULATORY_CARE_PROVIDER_SITE_OTHER): Payer: PRIVATE HEALTH INSURANCE | Admitting: Gastroenterology

## 2012-12-04 DIAGNOSIS — E538 Deficiency of other specified B group vitamins: Secondary | ICD-10-CM

## 2013-07-23 ENCOUNTER — Telehealth: Payer: Self-pay | Admitting: Gastroenterology

## 2013-07-23 NOTE — Telephone Encounter (Signed)
Pt was scheduled for a follow up with Dr Jarold Motto, he says his medication has not been helping and needs to discuss

## 2013-08-10 ENCOUNTER — Other Ambulatory Visit (INDEPENDENT_AMBULATORY_CARE_PROVIDER_SITE_OTHER): Payer: PRIVATE HEALTH INSURANCE

## 2013-08-10 ENCOUNTER — Encounter: Payer: Self-pay | Admitting: Gastroenterology

## 2013-08-10 ENCOUNTER — Ambulatory Visit (INDEPENDENT_AMBULATORY_CARE_PROVIDER_SITE_OTHER): Payer: PRIVATE HEALTH INSURANCE | Admitting: Gastroenterology

## 2013-08-10 VITALS — BP 132/84 | HR 48 | Ht 74.0 in | Wt 222.6 lb

## 2013-08-10 DIAGNOSIS — R109 Unspecified abdominal pain: Secondary | ICD-10-CM

## 2013-08-10 DIAGNOSIS — K219 Gastro-esophageal reflux disease without esophagitis: Secondary | ICD-10-CM

## 2013-08-10 DIAGNOSIS — K589 Irritable bowel syndrome without diarrhea: Secondary | ICD-10-CM

## 2013-08-10 LAB — IBC PANEL
Iron: 174 ug/dL — ABNORMAL HIGH (ref 42–165)
Saturation Ratios: 57.5 % — ABNORMAL HIGH (ref 20.0–50.0)
Transferrin: 216 mg/dL (ref 212.0–360.0)

## 2013-08-10 LAB — CBC WITH DIFFERENTIAL/PLATELET
Basophils Absolute: 0 10*3/uL (ref 0.0–0.1)
Basophils Relative: 0.6 % (ref 0.0–3.0)
Eosinophils Relative: 2 % (ref 0.0–5.0)
HCT: 45.6 % (ref 39.0–52.0)
Hemoglobin: 15.6 g/dL (ref 13.0–17.0)
Lymphs Abs: 1.2 10*3/uL (ref 0.7–4.0)
MCHC: 34.3 g/dL (ref 30.0–36.0)
MCV: 86.7 fl (ref 78.0–100.0)
Monocytes Relative: 7.6 % (ref 3.0–12.0)
Neutro Abs: 1.7 10*3/uL (ref 1.4–7.7)
Platelets: 159 10*3/uL (ref 150.0–400.0)
RBC: 5.26 Mil/uL (ref 4.22–5.81)
RDW: 12.4 % (ref 11.5–14.6)
WBC: 3.3 10*3/uL — ABNORMAL LOW (ref 4.5–10.5)

## 2013-08-10 LAB — BASIC METABOLIC PANEL
BUN: 12 mg/dL (ref 6–23)
CO2: 33 mEq/L — ABNORMAL HIGH (ref 19–32)
Calcium: 9.2 mg/dL (ref 8.4–10.5)
Creatinine, Ser: 1 mg/dL (ref 0.4–1.5)
GFR: 94.18 mL/min (ref >60.00)
Glucose, Bld: 107 mg/dL — ABNORMAL HIGH (ref 70–99)

## 2013-08-10 LAB — FOLATE: Folate: 9.4 ng/mL (ref >5.9)

## 2013-08-10 LAB — HEPATIC FUNCTION PANEL
ALT: 14 U/L (ref 0–53)
Bilirubin, Direct: 0.2 mg/dL (ref 0.0–0.3)
Total Bilirubin: 1.3 mg/dL — ABNORMAL HIGH (ref 0.3–1.2)

## 2013-08-10 LAB — FERRITIN: Ferritin: 178.7 ng/mL (ref 22.0–322.0)

## 2013-08-10 MED ORDER — DEXLANSOPRAZOLE 60 MG PO CPDR: 60.0000 mg | DELAYED_RELEASE_CAPSULE | Freq: Every day | ORAL | Status: DC

## 2013-08-10 NOTE — Patient Instructions (Addendum)
You have been scheduled for an abdominal ultrasound at [facility] (1st floor of hospital) on 08-14-2013 at 830 am. Please arrive 15 minutes prior to your appointment for registration. Make certain not to have anything to eat or drink 6 hours prior to your appointment. Should you need to reschedule your appointment, please contact radiology at [phone]. This test typically takes about 30 minutes to perform.  You have been scheduled for an endoscopy with propofol. Please follow written instructions given to you at your visit today. If you use inhalers (even only as needed), please bring them with you on the day of your procedure. Your physician has requested that you go to www.startemmi.com and enter the access code given to you at your visit today. This web site gives a general overview about your procedure. However, you should still follow specific instructions given to you by our office regarding your preparation for the procedure.  Your physician has requested that you go to the basement for the following lab work before leaving today: BMP CBC TSH Hepatic Function Panel Anemia Panel   New prescription for Dexilant was sent to your pharmacy

## 2013-08-10 NOTE — Progress Notes (Signed)
This is a 79 year old African American male that I follow for many years for chronic acid reflux.  Currently doing fairly well on Dexilant 60 mg a day, but has been out of his medication for the last month.  He also has a long history recurrent colon polyps and is up-to-date on his colonoscopy.  He cares a diagnosis of irritable bowel syndrome in addition to his acid reflux.  Is complaining of left-sided abdominal pain presentfor the last 20 years, and this continues to be an intermittent problem not related to any other alleviating or precipitating factors.  Has been no anorexia, weight loss, or specific hepatobiliary complaints.  Has a long history of alternating diarrhea and constipation, belching, burping, BPH, and primary hypertension along with B12 deficiency.  He denies abuse of alcohol, cigarettes, or NSAIDs.  He currently is on Librax 3 times a day and Dexilant 60 mg a day.  He specifically denies dysphagia.  Current Medications, Allergies, Past Medical History, Past Surgical History, Family History and Social History were reviewed in [employer] record.  ROS: All systems were reviewed and are negative unless otherwise stated in the HPI.          Physical Exam: Blood pressure 132/84, pulse 48 and regular weight 222 the BMI of 28.57.  I cannot appreciate stigmata of chronic liver disease.  Chest is clear he appears to be in a regular rhythm without murmurs gallops or rubs.  His abdomen shows no organomegaly, masses, or specific areas of tenderness.  Bowel sounds are normal and mental status is normal.    Assessment and Plan: Review of his record shows last endoscopy 10 years ago, and I think we should repeat this exam in this patient who his PPI-dependent.  Also have set him up for ultrasound to exclude significant splenomegaly can of was left upper quadrant pain.  Labs ordered for review including serum B12 level.  I've asked her continue his other medications as listed  and reviewed his chart.  We do his endoscopy we'll again repeat exam for H. pylori.  Antireflux regime again reviewed with patient.

## 2013-08-13 ENCOUNTER — Encounter: Payer: Self-pay | Admitting: Gastroenterology

## 2013-08-13 ENCOUNTER — Ambulatory Visit (AMBULATORY_SURGERY_CENTER): Payer: PRIVATE HEALTH INSURANCE | Admitting: Gastroenterology

## 2013-08-13 VITALS — BP 163/83 | HR 49 | Temp 97.3°F | Resp 19 | Ht 74.0 in | Wt 222.0 lb

## 2013-08-13 DIAGNOSIS — K219 Gastro-esophageal reflux disease without esophagitis: Secondary | ICD-10-CM

## 2013-08-13 DIAGNOSIS — R109 Unspecified abdominal pain: Secondary | ICD-10-CM

## 2013-08-13 MED ORDER — SODIUM CHLORIDE 0.9 % IV SOLN: 500.0000 mL | INTRAVENOUS | Status: DC

## 2013-08-13 NOTE — Progress Notes (Signed)
Patient did not experience any of the following events: a burn prior to discharge; a fall within the facility; wrong site/side/patient/procedure/implant event; or a hospital transfer or hospital admission upon discharge from the facility. (G8907) Patient did not have preoperative order for IV antibiotic SSI prophylaxis. (G8918)  

## 2013-08-13 NOTE — Patient Instructions (Signed)
YOU HAD AN ENDOSCOPIC PROCEDURE TODAY AT THE [facility]: Refer to the procedure report that was given to you for any specific questions about what was found during the examination.  If the procedure report does not answer your questions, please call your gastroenterologist to clarify.  If you requested that your care partner not be given the details of your procedure findings, then the procedure report has been included in a sealed envelope for you to review at your convenience later.  YOU SHOULD EXPECT: Some feelings of bloating in the abdomen. Passage of more gas than usual.  Walking can help get rid of the air that was put into your GI tract during the procedure and reduce the bloating. If you had a lower endoscopy (such as a colonoscopy or flexible sigmoidoscopy) you may notice spotting of blood in your stool or on the toilet paper. If you underwent a bowel prep for your procedure, then you may not have a normal bowel movement for a few days.  DIET: Your first meal following the procedure should be a light meal and then it is ok to progress to your normal diet.  A half-sandwich or bowl of soup is an example of a good first meal.  Heavy or fried foods are harder to digest and may make you feel nauseous or bloated.  Likewise meals heavy in dairy and vegetables can cause extra gas to form and this can also increase the bloating.  Drink plenty of fluids but you should avoid alcoholic beverages for 24 hours.  ACTIVITY: Your care partner should take you home directly after the procedure.  You should plan to take it easy, moving slowly for the rest of the day.  You can resume normal activity the day after the procedure however you should NOT DRIVE or use heavy machinery for 24 hours (because of the sedation medicines used during the test).    SYMPTOMS TO REPORT IMMEDIATELY: A gastroenterologist can be reached at any hour.  During normal business hours, 8:30 AM to 5:00 PM Monday through Friday,  call [phone].  After hours and on weekends, please call the GI answering service at [phone] who will take a message and have the physician on call contact you.   Following upper endoscopy (EGD)  Vomiting of blood or coffee ground material  New chest pain or pain under the shoulder blades  Painful or persistently difficult swallowing  New shortness of breath  Fever of 100F or higher  Black, tarry-looking stools  FOLLOW UP: If any biopsies were taken you will be contacted by phone or by letter within the next 1-3 weeks.  Call your gastroenterologist if you have not heard about the biopsies in 3 weeks.  Our staff will call the home number listed on your records the next business day following your procedure to check on you and address any questions or concerns that you may have at that time regarding the information given to you following your procedure. This is a courtesy call and so if there is no answer at the home number and we have not heard from you through the emergency physician on call, we will assume that you have returned to your regular daily activities without incident.  SIGNATURES/CONFIDENTIALITY: You and/or your care partner have signed paperwork which will be entered into your electronic medical record.  These signatures attest to the fact that that the information above on your After Visit Summary has been reviewed and is understood.  Full responsibility   of the confidentiality of this discharge information lies with you and/or your care-partner.  Resume medications. 

## 2013-08-13 NOTE — Progress Notes (Signed)
Report to pacu rn, vss, bbs=clear 

## 2013-08-13 NOTE — Op Note (Signed)
[facility] [address] [place], [address]   ENDOSCOPY PROCEDURE REPORT  PATIENT: Ronald, [name]  MR#: [id number] BIRTHDATE: [birth year] , 79  yrs. old GENDER: Male ENDOSCOPIST:David Hale Bogus, MD, [facility] REFERRED BY: PROCEDURE DATE:  08/13/2013 PROCEDURE:   EGD, diagnostic ASA CLASS:    Class III INDICATIONS: Chest pain and Follow up of esophageal reflux. MEDICATION: Propofol (Diprivan) 140 mg IV TOPICAL ANESTHETIC:   Cetacaine Spray  DESCRIPTION OF PROCEDURE:   After the risks and benefits of the procedure were explained, informed consent was obtained.  The LB [id number]  endoscope was introduced through the mouth  and advanced to the second portion of the duodenum .  The instrument was slowly withdrawn as the mucosa was fully examined.      DUODENUM: The duodenal mucosa showed no abnormalities in the bulb and second portion of the duodenum.  STOMACH: The mucosa of the stomach appeared normal.  ESOPHAGUS: The mucosa of the esophagus appeared normal.   A Schatzki ring was found at the gastroesophageal junction.    Retroflexed views revealed a 3-4 cm. hiatal hernia.    The scope was then withdrawn from the patient and the procedure completed.  COMPLICATIONS: There were no complications.   ENDOSCOPIC IMPRESSION: 1.   The duodenal mucosa showed no abnormalities in the bulb and second portion of the duodenum 2.   The mucosa of the stomach appeared normal 3.   The mucosa of the esophagus appeared normal 4.   Schatzki ring was found at the gastroesophageal junction..hx. of chronoc GERD ,no Barrett's mucosa noted.  RECOMMENDATIONS: Continue current medications.Marland KitchenMarland KitchenF/U prn.    _______________________________ eSigned:  Mardella Layman, MD, [facility] 08/13/2013 2:15 PM   standard discharge

## 2013-08-14 ENCOUNTER — Telehealth: Payer: Self-pay | Admitting: *Deleted

## 2013-08-14 ENCOUNTER — Telehealth: Payer: Self-pay

## 2013-08-14 ENCOUNTER — Ambulatory Visit (HOSPITAL_COMMUNITY)
Admission: RE | Admit: 2013-08-14 | Discharge: 2013-08-14 | Disposition: A | Discharge status: Home / Self Care | Payer: Medicare Other | Source: Ambulatory Visit | Attending: Gastroenterology | Admitting: Gastroenterology

## 2013-08-14 DIAGNOSIS — R109 Unspecified abdominal pain: Secondary | ICD-10-CM | POA: Insufficient documentation

## 2013-08-14 DIAGNOSIS — N289 Disorder of kidney and ureter, unspecified: Secondary | ICD-10-CM | POA: Insufficient documentation

## 2013-08-14 DIAGNOSIS — K7689 Other specified diseases of liver: Secondary | ICD-10-CM | POA: Insufficient documentation

## 2013-08-14 NOTE — Telephone Encounter (Signed)
Message copied by Florene Glen on Tue Aug 14, 2013 11:45 AM ------      Message from: Jarold Motto, DAVID R      Created: Tue Aug 14, 2013  9:44 AM       Please refer to urology for evaluation of his left flank pain and his abnormal abdominal ultrasound with  renal cysts ------

## 2013-08-14 NOTE — Telephone Encounter (Signed)
I called [phone] and spoke with Victorino Dike to start prior auth for Dexilant Was transferred to [facility] prior auth center([phone]) I spoke with Arlys John at [facility]  Patient's Dexilant was approved but per Arlys John it will be at a higher copay No approval code, fax will be sent and letter will be mailed to patient  Per Arlys John patient can try Lansoprazole or Nexium and copay may be less if he cannot afford Dexilant    I called [facility] [phone] and spoke with Caryn Bee, patients Dexliant went through

## 2013-08-14 NOTE — Telephone Encounter (Signed)
  Follow up Call-  No flowsheet data found.   Patient questions:  Do you have a fever, pain , or abdominal swelling? no Pain Score  0 *  Have you tolerated food without any problems? yes  Have you been able to return to your normal activities? yes  Do you have any questions about your discharge instructions: Diet   no Medications  no Follow up visit  no  Do you have questions or concerns about your Care? no  Actions: * If pain score is 4 or above: No action needed, pain <4.   

## 2013-08-14 NOTE — Telephone Encounter (Signed)
Informed pt of results and the need to see a Urologist. Pt sees Dr Marcelyn Bruins from [place] at the [facility]. Info faxed to [phone]. Informed pt who already has an appt with Dr Logan Bores tomorrow,

## 2013-08-16 ENCOUNTER — Other Ambulatory Visit (HOSPITAL_COMMUNITY): Payer: Self-pay | Admitting: Urology

## 2013-08-16 DIAGNOSIS — N281 Cyst of kidney, acquired: Secondary | ICD-10-CM

## 2013-09-10 ENCOUNTER — Encounter: Payer: Self-pay | Admitting: *Deleted

## 2013-09-12 DIAGNOSIS — R351 Nocturia: Secondary | ICD-10-CM | POA: Insufficient documentation

## 2013-09-13 ENCOUNTER — Encounter: Payer: Self-pay | Admitting: Gastroenterology

## 2013-09-13 ENCOUNTER — Ambulatory Visit (INDEPENDENT_AMBULATORY_CARE_PROVIDER_SITE_OTHER): Payer: Medicare Other | Admitting: Gastroenterology

## 2013-09-13 VITALS — BP 140/60 | HR 58 | Ht 74.0 in | Wt 222.0 lb

## 2013-09-13 DIAGNOSIS — K589 Irritable bowel syndrome without diarrhea: Secondary | ICD-10-CM

## 2013-09-13 DIAGNOSIS — I10 Essential (primary) hypertension: Secondary | ICD-10-CM

## 2013-09-13 DIAGNOSIS — Q391 Atresia of esophagus with tracheo-esophageal fistula: Secondary | ICD-10-CM

## 2013-09-13 DIAGNOSIS — I779 Disorder of arteries and arterioles, unspecified: Secondary | ICD-10-CM

## 2013-09-13 DIAGNOSIS — K222 Esophageal obstruction: Secondary | ICD-10-CM

## 2013-09-13 DIAGNOSIS — K219 Gastro-esophageal reflux disease without esophagitis: Secondary | ICD-10-CM

## 2013-09-13 DIAGNOSIS — M542 Cervicalgia: Secondary | ICD-10-CM

## 2013-09-13 NOTE — Patient Instructions (Signed)
You are scheduled for a Doppler at [facility] on 09-14-2013 at 10 am  Please arrive 15 minutes early for registration, please go to [facility](Section A) for registration

## 2013-09-13 NOTE — Progress Notes (Signed)
This is a 79 year old African American male he recently had endoscopy for followup as acid reflux and associated dysphagia. He has Schatzki's ring in his distal esophagus and evidence of acid reflux but exam otherwise was unremarkable, There was no evidence of Barrett's mucosa in the esophagus.. After his endoscopy, he describe some pain in his right neck area without fever, chills, painful swallowing, but has had some associated dizziness. He apparently was checked by is primary care physician Dr. Doristine Counter, and exam was unremarkable. He was apparently placed on" dizzy pills", exactly what I'm not sure. He does have a history of hypertensive cardiovascular disease, and is on atenolol 50 mg daily,amlodpine 5 mg a day, Dexilant 60 mg a day, when necessary Librax, and potassium tablets. He denies any fever, chills, or history of cervical spondylosis. I do not have his outside records from primary care for review. There is no history of palpitations, shortness of breath, chest pain with exertion, and apparently he relates that his neck pain is improving daily. He has not had any acid reflux on current medications and denies dysphagia. Endoscopy did reveal 3-4 cm hiatal hernia. No history of chronic arthritis of his neck and spine.  Current Medications, Allergies, Past Medical History, Past Surgical History, Family History and Social History were reviewed in [employer] record.  ROS: All systems were reviewed and are negative unless otherwise stated in the HPI.          Physical Exam: Healthy appearing patient in no distress. Blood pressure 140/60, pulse 58 and regular, and weight 222 with a BMI of 28.49. I cannot appreciate stigmata of chronic liver disease. Examination the neck shows no thyromegaly, lymphadenopathy, masses or tenderness. Oral pharyngeal exam is unremarkable. Chest is clear and he appears to be in a regular rhythm without murmurs gallops or rubs. There is no  hepatosplenomegaly, abdominal masses or tenderness. Bowel sounds are normal. Mental status is normal. I cannot appreciate any carotid bruits.    Assessment and Plan: Resolving right-sided neck pain after an endoscopy, the patient may have had some mild pharyngitis associated with this procedure. He currently is improving with conservative management. Because of his dizziness, his history of hypertension, I have ordered Doppler exams of his carotid arteries to be complete he is to continue other medications as listed and reviewed. Please send a copy this to Dr Kipp Brood. [facility]  primary care physician. I reviewed antireflux regime again with this patient, and we'll continue regular PPI suppressive therapy.

## 2013-09-14 ENCOUNTER — Ambulatory Visit (HOSPITAL_COMMUNITY)
Admission: RE | Admit: 2013-09-14 | Discharge: 2013-09-14 | Disposition: A | Discharge status: Home / Self Care | Payer: Medicare Other | Source: Ambulatory Visit | Attending: Gastroenterology | Admitting: Gastroenterology

## 2013-09-14 DIAGNOSIS — R0989 Other specified symptoms and signs involving the circulatory and respiratory systems: Secondary | ICD-10-CM

## 2013-09-14 DIAGNOSIS — R42 Dizziness and giddiness: Secondary | ICD-10-CM | POA: Insufficient documentation

## 2013-09-14 DIAGNOSIS — I779 Disorder of arteries and arterioles, unspecified: Secondary | ICD-10-CM

## 2013-09-14 NOTE — Progress Notes (Signed)
*  PRELIMINARY RESULTS* Vascular Ultrasound Carotid Duplex (Doppler) has been completed.   Findings suggest 1-39% internal carotid artery stenosis bilaterally. Vertebral arteries are patent with antegrade flow.  09/14/2013 11:24 AM Gertie Fey, RVT, RDCS, RDMS

## 2013-09-21 ENCOUNTER — Telehealth: Payer: Self-pay | Admitting: Gastroenterology

## 2013-09-21 NOTE — Telephone Encounter (Signed)
Read pt what I could from the report and informed him Dr Jarold Motto referred him back to Dr Doristine Counter; I did send the report to DR [facility]. Pt stated understanding.

## 2013-10-11 ENCOUNTER — Telehealth: Payer: Self-pay | Admitting: Cardiovascular Disease

## 2013-10-11 NOTE — Telephone Encounter (Signed)
Wrong pt

## 2013-10-18 ENCOUNTER — Telehealth: Payer: Self-pay | Admitting: *Deleted

## 2013-10-18 MED ORDER — OMEPRAZOLE 40 MG PO CPDR: 40.0000 mg | DELAYED_RELEASE_CAPSULE | Freq: Every day | ORAL | Status: DC

## 2013-10-18 NOTE — Telephone Encounter (Signed)
Via fax from [facility] patient cannot afford Dexilant.  Patient requesting Omeprazole. Omeprazole sent

## 2014-01-11 ENCOUNTER — Telehealth: Payer: Self-pay | Admitting: Internal Medicine

## 2014-01-11 NOTE — Telephone Encounter (Signed)
Spoke with patient's wife and he saw his PCP and was told to f/u with GI for bloating, gas. Scheduled with Tye Savoy, NP on 01/16/14 at 3:00 PM.

## 2014-01-16 ENCOUNTER — Ambulatory Visit (INDEPENDENT_AMBULATORY_CARE_PROVIDER_SITE_OTHER): Payer: Medicare HMO | Admitting: Nurse Practitioner

## 2014-01-16 ENCOUNTER — Other Ambulatory Visit (INDEPENDENT_AMBULATORY_CARE_PROVIDER_SITE_OTHER): Payer: Medicare HMO

## 2014-01-16 ENCOUNTER — Encounter: Payer: Self-pay | Admitting: Nurse Practitioner

## 2014-01-16 VITALS — BP 170/104 | HR 60 | Ht 71.0 in | Wt 234.4 lb

## 2014-01-16 DIAGNOSIS — K589 Irritable bowel syndrome without diarrhea: Secondary | ICD-10-CM

## 2014-01-16 DIAGNOSIS — E538 Deficiency of other specified B group vitamins: Secondary | ICD-10-CM

## 2014-01-16 DIAGNOSIS — R1012 Left upper quadrant pain: Secondary | ICD-10-CM

## 2014-01-16 DIAGNOSIS — R635 Abnormal weight gain: Secondary | ICD-10-CM

## 2014-01-16 DIAGNOSIS — R143 Flatulence: Secondary | ICD-10-CM

## 2014-01-16 DIAGNOSIS — R141 Gas pain: Secondary | ICD-10-CM

## 2014-01-16 DIAGNOSIS — R140 Abdominal distension (gaseous): Secondary | ICD-10-CM

## 2014-01-16 DIAGNOSIS — R142 Eructation: Secondary | ICD-10-CM

## 2014-01-16 LAB — HEPATIC FUNCTION PANEL
ALT: 19 U/L (ref 0–53)
AST: 25 U/L (ref 0–37)
Albumin: 4.1 g/dL (ref 3.5–5.2)
Alkaline Phosphatase: 60 U/L (ref 39–117)
Bilirubin, Direct: 0.3 mg/dL (ref 0.0–0.3)
Total Bilirubin: 1.5 mg/dL — ABNORMAL HIGH (ref 0.3–1.2)
Total Protein: 6.8 g/dL (ref 6.0–8.3)

## 2014-01-16 LAB — VITAMIN B12: VITAMIN B 12: 537 pg/mL (ref 211–911)

## 2014-01-16 MED ORDER — CILIDINIUM-CHLORDIAZEPOXIDE 2.5-5 MG PO CAPS: ORAL_CAPSULE | ORAL | Status: DC

## 2014-01-16 NOTE — Progress Notes (Signed)
Reviewed and agree with management plan.  Sheyla Zaffino T. Perlie Scheuring, MD FACG 

## 2014-01-16 NOTE — Patient Instructions (Addendum)
You have been scheduled for an abdominal ultrasound at [facility]iology (1st floor of hospital) on 01-18-2014 at 830 am. Please arrive 15 minutes prior to your appointment for registration. Make certain not to have anything to eat or drink 6 hours prior to your appointment. Should you need to reschedule your appointment, please contact radiology at [phone]. This test typically takes about 30 minutes to perform.  Please keep appointment with Dr. Hilarie Fredrickson for 02-19-2014 at 12 am.  Your physician has requested that you go to the basement for the following lab work before leaving today: LFT B 12 Level  We have sent the following medications to your pharmacy for you to pick up at your convenience: Librax, please take one tablet by mouth once or twice daily as needed

## 2014-01-16 NOTE — Progress Notes (Signed)
     History of Present Illness:  Patient is a 80 year old male known to Dr. Sharlett Iles for GERD/ schatzki's ring / IBS / chronic left sided abdominal pain present for over 20 years/ and B12 deficiency. Patient was last seen here in November 2014. His last EGD was done October 2014 and revealed a 3-4 cm hiatal hernia.    Patient comes in today for evaluation of bloating, gas, LUQ pain and fatigue.  He isn't eating much lately but denies nausea.  BMs are at baseline. By our records his weight is up from 122 to 134 since last visit. Patient used to take Librax but no longer on medication list and patient tells me PCP stopped it a long time ago.   Current Medications, Allergies, Past Medical History, Past Surgical History, Family History and Social History were reviewed in [employer] record.  Physical Exam: General: Pleasant, well developed , black male in no acute distress Head: Normocephalic and atraumatic Eyes:  sclerae anicteric, conjunctiva pink  Ears: Normal auditory acuity Lungs: Clear throughout to auscultation Heart: Regular rate and rhythm Abdomen: Soft, non distended, non-tender. RUQ somewhat firm, lower liver edge 2 fingerbreaths below right subcostal margin. No masses, no hepatomegaly. Normal bowel sounds Musculoskeletal: Symmetrical with no gross deformities  Extremities: 1-2 + BLE edema.  Neurological: Alert oriented x 4, grossly nonfocal Psychological:  Alert and cooperative. Normal mood and affect  Assessment and Recommendations:   2. 80 year old male with chronic LUQ pain. Patient hasn't taken librax in a long time but thinks it helped. Will reorder Librax which he can take 1-2 times daily as needed for abdominal pain. Patient does have a history of BPH so asked him to take Librax judiciously.   2. Bloating / poor appetite / fatigue. His weight is actually up several pounds since last visit, some of which may be volume overload given peripheral edema.  On exam the RUQ feels full, will obtain an ultrasound and LFTs today. Etiology of fatigue not clear, not sure if has some degree of heart failure?  I don't have records from PCP so not sure if a TSH has been checked.   3. Chronic B12 deficiency but no longer getting monthly injections. Will check B12 level. If low, this could contribute to fatigue  4. HTN. On treatment but BP quite elevated today.

## 2014-01-18 ENCOUNTER — Ambulatory Visit (HOSPITAL_COMMUNITY)
Admission: RE | Admit: 2014-01-18 | Discharge: 2014-01-18 | Disposition: A | Discharge status: Home / Self Care | Payer: Medicare HMO | Source: Ambulatory Visit | Attending: Nurse Practitioner | Admitting: Nurse Practitioner

## 2014-01-18 DIAGNOSIS — R109 Unspecified abdominal pain: Secondary | ICD-10-CM | POA: Insufficient documentation

## 2014-01-18 DIAGNOSIS — N289 Disorder of kidney and ureter, unspecified: Secondary | ICD-10-CM | POA: Insufficient documentation

## 2014-01-18 DIAGNOSIS — E538 Deficiency of other specified B group vitamins: Secondary | ICD-10-CM

## 2014-01-18 DIAGNOSIS — R1012 Left upper quadrant pain: Secondary | ICD-10-CM

## 2014-01-18 DIAGNOSIS — K7689 Other specified diseases of liver: Secondary | ICD-10-CM | POA: Insufficient documentation

## 2014-02-11 ENCOUNTER — Ambulatory Visit: Payer: Medicare Other | Admitting: Internal Medicine

## 2014-02-13 ENCOUNTER — Ambulatory Visit (HOSPITAL_COMMUNITY): Admission: RE | Admit: 2014-02-13 | Payer: Medicare HMO | Source: Ambulatory Visit

## 2014-02-14 ENCOUNTER — Encounter: Payer: Self-pay | Admitting: Internal Medicine

## 2014-02-19 ENCOUNTER — Ambulatory Visit: Payer: Medicare Other | Admitting: Internal Medicine

## 2014-03-23 ENCOUNTER — Encounter (HOSPITAL_COMMUNITY): Payer: Self-pay | Admitting: Emergency Medicine

## 2014-03-23 DIAGNOSIS — J189 Pneumonia, unspecified organism: Secondary | ICD-10-CM | POA: Diagnosis present

## 2014-03-23 DIAGNOSIS — K589 Irritable bowel syndrome without diarrhea: Secondary | ICD-10-CM | POA: Diagnosis present

## 2014-03-23 DIAGNOSIS — R7309 Other abnormal glucose: Secondary | ICD-10-CM | POA: Diagnosis present

## 2014-03-23 DIAGNOSIS — N181 Chronic kidney disease, stage 1: Secondary | ICD-10-CM | POA: Diagnosis present

## 2014-03-23 DIAGNOSIS — D696 Thrombocytopenia, unspecified: Secondary | ICD-10-CM | POA: Diagnosis present

## 2014-03-23 DIAGNOSIS — I509 Heart failure, unspecified: Secondary | ICD-10-CM | POA: Diagnosis present

## 2014-03-23 DIAGNOSIS — J9600 Acute respiratory failure, unspecified whether with hypoxia or hypercapnia: Secondary | ICD-10-CM | POA: Diagnosis present

## 2014-03-23 DIAGNOSIS — E785 Hyperlipidemia, unspecified: Secondary | ICD-10-CM | POA: Diagnosis present

## 2014-03-23 DIAGNOSIS — I2789 Other specified pulmonary heart diseases: Secondary | ICD-10-CM | POA: Diagnosis present

## 2014-03-23 DIAGNOSIS — E538 Deficiency of other specified B group vitamins: Secondary | ICD-10-CM | POA: Diagnosis present

## 2014-03-23 DIAGNOSIS — Z87891 Personal history of nicotine dependence: Secondary | ICD-10-CM

## 2014-03-23 DIAGNOSIS — I5043 Acute on chronic combined systolic (congestive) and diastolic (congestive) heart failure: Principal | ICD-10-CM | POA: Diagnosis present

## 2014-03-23 DIAGNOSIS — Z7901 Long term (current) use of anticoagulants: Secondary | ICD-10-CM

## 2014-03-23 DIAGNOSIS — K219 Gastro-esophageal reflux disease without esophagitis: Secondary | ICD-10-CM | POA: Diagnosis present

## 2014-03-23 DIAGNOSIS — I129 Hypertensive chronic kidney disease with stage 1 through stage 4 chronic kidney disease, or unspecified chronic kidney disease: Secondary | ICD-10-CM | POA: Diagnosis present

## 2014-03-23 DIAGNOSIS — I079 Rheumatic tricuspid valve disease, unspecified: Secondary | ICD-10-CM | POA: Diagnosis present

## 2014-03-23 DIAGNOSIS — I498 Other specified cardiac arrhythmias: Secondary | ICD-10-CM | POA: Diagnosis not present

## 2014-03-23 DIAGNOSIS — N400 Enlarged prostate without lower urinary tract symptoms: Secondary | ICD-10-CM | POA: Diagnosis present

## 2014-03-23 DIAGNOSIS — G4730 Sleep apnea, unspecified: Secondary | ICD-10-CM | POA: Diagnosis present

## 2014-03-23 DIAGNOSIS — I4891 Unspecified atrial fibrillation: Secondary | ICD-10-CM | POA: Diagnosis present

## 2014-03-23 NOTE — ED Notes (Signed)
The pt has had abd swelling and feet and legs swelling  For 3 months.  He was seen by his heart doctor 3 weeks ago and he is due to see him on the 3rd of june

## 2014-03-23 NOTE — ED Notes (Signed)
The pt reports no pain just bloating in his abd with the feet and leg swelling and some sob at night

## 2014-03-24 ENCOUNTER — Encounter (HOSPITAL_COMMUNITY): Payer: Self-pay | Admitting: *Deleted

## 2014-03-24 ENCOUNTER — Inpatient Hospital Stay (HOSPITAL_COMMUNITY)
Admission: EM | Admit: 2014-03-24 | Discharge: 2014-03-29 | DRG: 291 | Disposition: A | Discharge status: Home Health | Payer: Medicare HMO | Attending: Internal Medicine | Admitting: Internal Medicine

## 2014-03-24 ENCOUNTER — Emergency Department (HOSPITAL_COMMUNITY): Payer: Medicare HMO

## 2014-03-24 ENCOUNTER — Inpatient Hospital Stay (HOSPITAL_COMMUNITY): Payer: Medicare HMO

## 2014-03-24 DIAGNOSIS — J309 Allergic rhinitis, unspecified: Secondary | ICD-10-CM

## 2014-03-24 DIAGNOSIS — I5021 Acute systolic (congestive) heart failure: Secondary | ICD-10-CM

## 2014-03-24 DIAGNOSIS — K2100 Gastro-esophageal reflux disease with esophagitis, without bleeding: Secondary | ICD-10-CM

## 2014-03-24 DIAGNOSIS — I110 Hypertensive heart disease with heart failure: Secondary | ICD-10-CM | POA: Diagnosis present

## 2014-03-24 DIAGNOSIS — E785 Hyperlipidemia, unspecified: Secondary | ICD-10-CM | POA: Diagnosis present

## 2014-03-24 DIAGNOSIS — R140 Abdominal distension (gaseous): Secondary | ICD-10-CM

## 2014-03-24 DIAGNOSIS — K219 Gastro-esophageal reflux disease without esophagitis: Secondary | ICD-10-CM

## 2014-03-24 DIAGNOSIS — I10 Essential (primary) hypertension: Secondary | ICD-10-CM

## 2014-03-24 DIAGNOSIS — I509 Heart failure, unspecified: Secondary | ICD-10-CM

## 2014-03-24 DIAGNOSIS — I071 Rheumatic tricuspid insufficiency: Secondary | ICD-10-CM

## 2014-03-24 DIAGNOSIS — I5023 Acute on chronic systolic (congestive) heart failure: Secondary | ICD-10-CM | POA: Diagnosis present

## 2014-03-24 DIAGNOSIS — Z86010 Personal history of colonic polyps: Secondary | ICD-10-CM

## 2014-03-24 DIAGNOSIS — J189 Pneumonia, unspecified organism: Secondary | ICD-10-CM | POA: Diagnosis present

## 2014-03-24 DIAGNOSIS — I369 Nonrheumatic tricuspid valve disorder, unspecified: Secondary | ICD-10-CM

## 2014-03-24 DIAGNOSIS — E538 Deficiency of other specified B group vitamins: Secondary | ICD-10-CM | POA: Diagnosis present

## 2014-03-24 DIAGNOSIS — I119 Hypertensive heart disease without heart failure: Secondary | ICD-10-CM | POA: Diagnosis present

## 2014-03-24 DIAGNOSIS — I4891 Unspecified atrial fibrillation: Secondary | ICD-10-CM

## 2014-03-24 DIAGNOSIS — I5031 Acute diastolic (congestive) heart failure: Secondary | ICD-10-CM | POA: Diagnosis present

## 2014-03-24 DIAGNOSIS — A419 Sepsis, unspecified organism: Secondary | ICD-10-CM

## 2014-03-24 DIAGNOSIS — R635 Abnormal weight gain: Secondary | ICD-10-CM

## 2014-03-24 DIAGNOSIS — I272 Pulmonary hypertension, unspecified: Secondary | ICD-10-CM

## 2014-03-24 DIAGNOSIS — I5022 Chronic systolic (congestive) heart failure: Secondary | ICD-10-CM | POA: Diagnosis present

## 2014-03-24 DIAGNOSIS — I4821 Permanent atrial fibrillation: Secondary | ICD-10-CM | POA: Diagnosis present

## 2014-03-24 DIAGNOSIS — K589 Irritable bowel syndrome without diarrhea: Secondary | ICD-10-CM | POA: Diagnosis present

## 2014-03-24 DIAGNOSIS — J810 Acute pulmonary edema: Secondary | ICD-10-CM

## 2014-03-24 HISTORY — DX: Hyperlipidemia, unspecified: E78.5

## 2014-03-24 HISTORY — DX: Pneumonia, unspecified organism: J18.9

## 2014-03-24 HISTORY — DX: Sleep apnea, unspecified: G47.30

## 2014-03-24 HISTORY — DX: Gastro-esophageal reflux disease without esophagitis: K21.9

## 2014-03-24 HISTORY — DX: Unspecified glaucoma: H40.9

## 2014-03-24 LAB — URINALYSIS, ROUTINE W REFLEX MICROSCOPIC
BILIRUBIN URINE: NEGATIVE
Glucose, UA: NEGATIVE mg/dL
Hgb urine dipstick: NEGATIVE
Ketones, ur: NEGATIVE mg/dL
Leukocytes, UA: NEGATIVE
NITRITE: NEGATIVE
Protein, ur: 100 mg/dL — AB
SPECIFIC GRAVITY, URINE: 1.019 (ref 1.005–1.030)
UROBILINOGEN UA: 1 mg/dL (ref 0.0–1.0)
pH: 6 (ref 5.0–8.0)

## 2014-03-24 LAB — HEPATIC FUNCTION PANEL
ALK PHOS: 55 U/L (ref 39–117)
ALT: 13 U/L (ref 0–53)
AST: 24 U/L (ref 0–37)
Albumin: 3.2 g/dL — ABNORMAL LOW (ref 3.5–5.2)
BILIRUBIN DIRECT: 0.5 mg/dL — AB (ref 0.0–0.3)
BILIRUBIN INDIRECT: 1.3 mg/dL — AB (ref 0.3–0.9)
TOTAL PROTEIN: 6 g/dL (ref 6.0–8.3)
Total Bilirubin: 1.8 mg/dL — ABNORMAL HIGH (ref 0.3–1.2)

## 2014-03-24 LAB — URINE MICROSCOPIC-ADD ON

## 2014-03-24 LAB — BASIC METABOLIC PANEL
BUN: 13 mg/dL (ref 6–23)
CHLORIDE: 104 meq/L (ref 96–112)
CO2: 25 mEq/L (ref 19–32)
Calcium: 9 mg/dL (ref 8.4–10.5)
Creatinine, Ser: 1.47 mg/dL — ABNORMAL HIGH (ref 0.50–1.35)
GFR calc Af Amer: 51 mL/min — ABNORMAL LOW (ref >90)
GFR calc non Af Amer: 44 mL/min — ABNORMAL LOW (ref >90)
Glucose, Bld: 116 mg/dL — ABNORMAL HIGH (ref 70–99)
Potassium: 3.8 mEq/L (ref 3.7–5.3)
Sodium: 141 mEq/L (ref 137–147)

## 2014-03-24 LAB — CBC
HCT: 46.6 % (ref 39.0–52.0)
Hemoglobin: 15.2 g/dL (ref 13.0–17.0)
MCH: 29.9 pg (ref 26.0–34.0)
MCHC: 32.6 g/dL (ref 30.0–36.0)
MCV: 91.7 fL (ref 78.0–100.0)
Platelets: 110 10*3/uL — ABNORMAL LOW (ref 150–400)
RBC: 5.08 MIL/uL (ref 4.22–5.81)
RDW: 13.3 % (ref 11.5–15.5)
WBC: 5.4 10*3/uL (ref 4.0–10.5)

## 2014-03-24 LAB — TROPONIN I: Troponin I: <0.3 ng/mL (ref <0.30)

## 2014-03-24 LAB — MRSA PCR SCREENING: MRSA by PCR: NEGATIVE

## 2014-03-24 LAB — I-STAT CG4 LACTIC ACID, ED: Lactic Acid, Venous: 0.78 mmol/L (ref 0.5–2.2)

## 2014-03-24 LAB — PRO B NATRIURETIC PEPTIDE: PRO B NATRI PEPTIDE: 6491 pg/mL — AB (ref 0–450)

## 2014-03-24 MED ORDER — LEVALBUTEROL HCL 0.63 MG/3ML IN NEBU
0.6300 mg | INHALATION_SOLUTION | RESPIRATORY_TRACT | Status: DC | PRN
  Administered 2014-03-24: 0.63 mg via RESPIRATORY_TRACT
  Filled 2014-03-24: qty 3

## 2014-03-24 MED ORDER — RIVAROXABAN 15 MG PO TABS
15.0000 mg | ORAL_TABLET | Freq: Every day | ORAL | Status: DC
  Administered 2014-03-24 – 2014-03-25 (×2): 15 mg via ORAL
  Filled 2014-03-24 (×2): qty 1

## 2014-03-24 MED ORDER — AZITHROMYCIN 500 MG IV SOLR
500.0000 mg | Freq: Once | INTRAVENOUS | Status: AC
  Administered 2014-03-24: 500 mg via INTRAVENOUS

## 2014-03-24 MED ORDER — LOSARTAN POTASSIUM 50 MG PO TABS
50.0000 mg | ORAL_TABLET | Freq: Every day | ORAL | Status: DC
  Administered 2014-03-25: 50 mg via ORAL
  Filled 2014-03-24 (×2): qty 1

## 2014-03-24 MED ORDER — DILTIAZEM HCL 100 MG IV SOLR
5.0000 mg/h | INTRAVENOUS | Status: DC
  Administered 2014-03-24: 5 mg/h via INTRAVENOUS
  Administered 2014-03-25: 10 mg/h via INTRAVENOUS

## 2014-03-24 MED ORDER — SODIUM CHLORIDE 0.9 % IJ SOLN: 3.0000 mL | INTRAMUSCULAR | Status: DC | PRN

## 2014-03-24 MED ORDER — SODIUM CHLORIDE 0.9 % IJ SOLN
3.0000 mL | Freq: Two times a day (BID) | INTRAMUSCULAR | Status: DC
  Administered 2014-03-24 (×2): 3 mL via INTRAVENOUS

## 2014-03-24 MED ORDER — METHYLPREDNISOLONE SODIUM SUCC 125 MG IJ SOLR
60.0000 mg | INTRAMUSCULAR | Status: DC
  Administered 2014-03-24: 60 mg via INTRAVENOUS
  Filled 2014-03-24 (×2): qty 0.96

## 2014-03-24 MED ORDER — AMLODIPINE BESYLATE 2.5 MG PO TABS
2.5000 mg | ORAL_TABLET | Freq: Every day | ORAL | Status: DC
  Administered 2014-03-25: 2.5 mg via ORAL
  Filled 2014-03-24 (×2): qty 1

## 2014-03-24 MED ORDER — FUROSEMIDE 10 MG/ML IJ SOLN
60.0000 mg | Freq: Two times a day (BID) | INTRAMUSCULAR | Status: DC
  Administered 2014-03-25: 60 mg via INTRAVENOUS
  Filled 2014-03-24: qty 6

## 2014-03-24 MED ORDER — BUDESONIDE 0.5 MG/2ML IN SUSP
0.5000 mg | Freq: Two times a day (BID) | RESPIRATORY_TRACT | Status: DC
  Administered 2014-03-25 – 2014-03-29 (×9): 0.5 mg via RESPIRATORY_TRACT
  Filled 2014-03-24 (×12): qty 2

## 2014-03-24 MED ORDER — DM-GUAIFENESIN ER 30-600 MG PO TB12
1.0000 | ORAL_TABLET | Freq: Two times a day (BID) | ORAL | Status: DC
  Administered 2014-03-24 – 2014-03-29 (×10): 1 via ORAL
  Filled 2014-03-24 (×12): qty 1

## 2014-03-24 MED ORDER — SODIUM CHLORIDE 0.9 % IV SOLN: 250.0000 mL | INTRAVENOUS | Status: DC | PRN

## 2014-03-24 MED ORDER — LEVALBUTEROL HCL 0.63 MG/3ML IN NEBU
0.6300 mg | INHALATION_SOLUTION | Freq: Four times a day (QID) | RESPIRATORY_TRACT | Status: DC
  Administered 2014-03-25 – 2014-03-26 (×8): 0.63 mg via RESPIRATORY_TRACT
  Filled 2014-03-24 (×15): qty 3

## 2014-03-24 MED ORDER — ACETAMINOPHEN 325 MG PO TABS
650.0000 mg | ORAL_TABLET | Freq: Once | ORAL | Status: AC
  Administered 2014-03-24: 650 mg via ORAL
  Filled 2014-03-24: qty 2

## 2014-03-24 MED ORDER — POTASSIUM CHLORIDE CRYS ER 20 MEQ PO TBCR
20.0000 meq | EXTENDED_RELEASE_TABLET | Freq: Every day | ORAL | Status: DC
  Administered 2014-03-24 – 2014-03-29 (×6): 20 meq via ORAL
  Filled 2014-03-24 (×6): qty 1

## 2014-03-24 MED ORDER — HYDRALAZINE HCL 20 MG/ML IJ SOLN: 10.0000 mg | Freq: Four times a day (QID) | INTRAMUSCULAR | Status: DC | PRN

## 2014-03-24 MED ORDER — FUROSEMIDE 10 MG/ML IJ SOLN
20.0000 mg | Freq: Once | INTRAMUSCULAR | Status: AC
Start: 2014-03-24 — End: 2014-03-24
  Administered 2014-03-24: 20 mg via INTRAVENOUS
  Filled 2014-03-24: qty 2

## 2014-03-24 MED ORDER — DEXTROSE 5 % IV SOLN
1.0000 g | Freq: Once | INTRAVENOUS | Status: AC
  Administered 2014-03-24: 1 g via INTRAVENOUS
  Filled 2014-03-24: qty 10

## 2014-03-24 MED ORDER — BUDESONIDE 0.5 MG/2ML IN SUSP
0.5000 mg | Freq: Two times a day (BID) | RESPIRATORY_TRACT | Status: DC
Start: 2014-03-24 — End: 2014-03-24

## 2014-03-24 MED ORDER — HYDROMORPHONE HCL PF 1 MG/ML IJ SOLN: 0.5000 mg | INTRAMUSCULAR | Status: DC | PRN

## 2014-03-24 MED ORDER — DEXTROSE 5 % IV SOLN
500.0000 mg | INTRAVENOUS | Status: DC
  Administered 2014-03-24: 500 mg via INTRAVENOUS
  Filled 2014-03-24 (×2): qty 500

## 2014-03-24 MED ORDER — ATENOLOL 50 MG PO TABS
50.0000 mg | ORAL_TABLET | Freq: Every day | ORAL | Status: DC
  Administered 2014-03-25 – 2014-03-26 (×2): 50 mg via ORAL
  Filled 2014-03-24 (×3): qty 1

## 2014-03-24 MED ORDER — DILTIAZEM LOAD VIA INFUSION
20.0000 mg | Freq: Once | INTRAVENOUS | Status: AC
  Administered 2014-03-24: 20 mg via INTRAVENOUS
  Filled 2014-03-24: qty 20

## 2014-03-24 MED ORDER — FUROSEMIDE 10 MG/ML IJ SOLN
40.0000 mg | Freq: Two times a day (BID) | INTRAMUSCULAR | Status: DC
  Administered 2014-03-24 (×2): 40 mg via INTRAVENOUS
  Filled 2014-03-24 (×3): qty 4

## 2014-03-24 MED ORDER — DILTIAZEM HCL 100 MG IV SOLR
5.0000 mg/h | INTRAVENOUS | Status: DC
  Administered 2014-03-24: 5 mg/h via INTRAVENOUS

## 2014-03-24 MED ORDER — DEXTROSE 5 % IV SOLN
1.0000 g | INTRAVENOUS | Status: DC
  Administered 2014-03-24 – 2014-03-28 (×5): 1 g via INTRAVENOUS
  Filled 2014-03-24 (×7): qty 10

## 2014-03-24 NOTE — H&P (Addendum)
[facility] History and Physical  Ronald [name] VOZ:[id number] DOB: 07/26/1934 DOA: 03/24/2014  Referring physician: EDP PCP: Stephens Shire, MD  Specialists:   Chief Complaint: Worsening SOB and Lower Leg Swelling  HPI: Ronald [name] is a 80 y.o. male with a history of HTN, GERD, IBS, who presents to the ED with complaints of worsening SOB and Lower Leg Swelling x3 months but worst for the past 3 days.  He reports having DOE, and Orthopnea.  He denies having chest pain but reports having palpitations.  He also reports having fevers and chills over the past 24 hours.   He was evaluated in the ED and was found to have a temperature to 101.6 and a chest x-ray tht revealed a mixed picture of cardiomegaly with CHF findings and a left mid-lung Infiltrate and a Pro-BNP of 6491.0.   He was also found to be in Atrial fibrillation with RVR.  He was placed on IV Antibiotic to cover CAP, and was also placed on an IV Cardizem drip due to the A.Fib with RVR, and administered IV Lasix X 1 and then referred for medical admission.     Review of Systems:  Constitutional: No Weight Loss, No Weight Gain, Night Sweats, +Fevers, +Chills, Fatigue, or Generalized Weakness HEENT: No Headaches, Difficulty Swallowing,Tooth/Dental Problems,Sore Throat,  No Sneezing, Rhinitis, Ear Ache, Nasal Congestion, or Post Nasal Drip,  Cardio-vascular:  No Chest pain, +Orthopnea, PND, +Edema in lower extremities, Anasarca, Dizziness, +Palpitations  Resp: +Dyspnea, +DOE, No Cough, No Hemoptysis, No Wheezing.    GI: No Heartburn, Indigestion, Abdominal Pain, Nausea, Vomiting, Diarrhea, Change in Bowel Habits,  Loss of Appetite  GU: No Dysuria, Change in Color of Urine, No Urgency or Frequency.  No Flank pain.  Musculoskeletal: No Joint Pain or Swelling.  No Decreased Range of Motion. No Back Pain.  Neurologic: No Syncope, No Seizures, Muscle Weakness, Paresthesia, Vision Disturbance or Loss, No Diplopia, No Vertigo, No  Difficulty Walking,  Skin: No Rash or Lesions. Psych: No Change in Mood or Affect. No Depression or Anxiety. No Memory loss, No Confusion, or Hallucinations   Past Medical History  Diagnosis Date  . Irritable bowel syndrome   . Lactose intolerance   . Other and unspecified hyperlipidemia   . Reflux esophagitis   . Essential hypertension, benign   . Allergic rhinitis, cause unspecified   . Vitamin B12 deficiency   . BPH (benign prostatic hypertrophy)   . Personal history of colonic polyps 03/04/1992    adenomatous polyp  . Hiatal hernia 2014  . Schatzki's ring 2014     Past Surgical History  Procedure Laterality Date  . Prostate surgery    . Nose surgery        Prior to Admission medications   Medication Sig Start Date End Date Taking? Authorizing Provider  amLODipine (NORVASC) 5 MG tablet Take 2.5 mg by mouth daily.    Yes Historical Provider, MD  atenolol (TENORMIN) 50 MG tablet Take 50 mg by mouth daily.   Yes Historical Provider, MD  clidinium-chlordiazePOXIDE (LIBRAX) 5-2.5 MG per capsule Take 1 capsule by mouth 2 (two) times daily as needed (ibs).   Yes Historical Provider, MD  furosemide (LASIX) 40 MG tablet Take 40 mg by mouth daily.   Yes Historical Provider, MD  losartan (COZAAR) 100 MG tablet Take 50 mg by mouth daily.   Yes Historical Provider, MD  potassium chloride SA (K-DUR,KLOR-CON) 20 MEQ tablet Take 20 mEq by mouth daily.   Yes Historical Provider,  MD  Rivaroxaban (XARELTO) 15 MG TABS tablet Take 15 mg by mouth daily.   Yes Historical Provider, MD     No Known Allergies   Social History:  reports that he has never smoked. He does not have any smokeless tobacco history on file. He reports that he does not drink alcohol or use illicit drugs.     No family history on file.- Patient Denies any Family History.     Physical Exam:  GEN:  Pleasant Elderly Well Nourished and Well Developed 81 y.o. African American male examined and in no acute distress;  cooperative with exam Filed Vitals:   03/24/14 0215 03/24/14 0230 03/24/14 0245 03/24/14 0300  BP: 132/77 126/77 132/96 121/77  Pulse: 101 106 87 47  Temp:      TempSrc:      Resp: 33 22 22 25   Height:      Weight:      SpO2: 96% 92% 93% 93%   Blood pressure 121/77, pulse 47, temperature 101.6 F (38.7 C), temperature source Oral, resp. rate 25, height 6' (1.829 m), weight 108.438 kg (239 lb 1 oz), SpO2 93.00%. PSYCH: He is alert and oriented x4; does not appear anxious does not appear depressed; affect is normal HEENT: Normocephalic and Atraumatic, Mucous membranes pink; PERRLA; EOM intact; Fundi:  Benign;  No scleral icterus, Nares: Patent, Oropharynx: Clear, Fair Dentition, Neck:  FROM, no cervical lymphadenopathy nor thyromegaly or carotid bruit; no JVD; Breasts:: Not examined CHEST WALL: No tenderness CHEST: Normal respiration, clear to auscultation bilaterally HEART: Irregular rate and rhythm; no murmurs rubs or gallops BACK: No kyphosis or scoliosis; no CVA tenderness ABDOMEN: Positive Bowel Sounds,  Obese, soft non-tender; no masses, no organomegaly. Rectal Exam: Not done EXTREMITIES: No cyanosis, clubbing, 3+ BLE EDEMA; no ulcerations. Genitalia: not examined PULSES: 2+ and symmetric SKIN: Normal hydration no rash or ulceration CNS: Alert and Oriented X 4, No focal Deficits Vascular: pulses palpable throughout    Labs on Admission:  Basic Metabolic Panel:  Recent Labs Lab 03/24/14  NA 141  K 3.8  CL 104  CO2 25  GLUCOSE 116*  BUN 13  CREATININE 1.47*  CALCIUM 9.0   Liver Function Tests: No results found for this basename: AST, ALT, ALKPHOS, BILITOT, PROT, ALBUMIN,  in the last 168 hours No results found for this basename: LIPASE, AMYLASE,  in the last 168 hours No results found for this basename: AMMONIA,  in the last 168 hours CBC:  Recent Labs Lab 03/24/14  WBC 5.4  HGB 15.2  HCT 46.6  MCV 91.7  PLT 110*   Cardiac Enzymes:  Recent Labs Lab  03/24/14  TROPONINI <0.30    BNP (last 3 results)  Recent Labs  03/24/14  PROBNP 6491.0*   CBG: No results found for this basename: GLUCAP,  in the last 168 hours  Radiological Exams on Admission: Dg Chest Port 1 View  03/24/2014   CLINICAL DATA:  Bloating sensation.  EXAM: PORTABLE CHEST - 1 VIEW  COMPARISON:  Chest radiograph from 11/15/2007  FINDINGS: The lungs are well-aerated. Patchy left-sided airspace opacification, particularly about the left hilum, may reflect pneumonia. The right lung appears relatively clear. No pleural effusion or pneumothorax is seen.  The cardiomediastinal silhouette is borderline enlarged. No acute osseous abnormalities are seen.  IMPRESSION: 1. Patchy left-sided airspace opacification may reflect pneumonia. 2. Borderline cardiomegaly.   Electronically Signed   By: Garald Balding M.D.   On: 03/24/2014 01:18     EKG: Independently  reviewed. Atrial Fibrillation with RVR  Rate 156,  +RBBB, and ischemic changes in lateral leads.     Assessment/Plan:   80 y.o. male with  Principal Problem:   Acute diastolic CHF (congestive heart failure) Active Problems:   HYPERLIPIDEMIA   HYPERTENSION, BENIGN   VITAMIN B12 DEFICIENCY   IBS (irritable bowel syndrome)   Community acquired pneumonia   Atrial fibrillation with RVR     1.   Acute Diastolic CHF-  Placed on the Acute CHF protocol to diurese with IV lasix, 2D ECHO inAM,  On ARB and B-Beta Blocker Rx daily.     2.   Atrial fibrillation with RVR-   Placed on IV Cardizem Drip to titrate to decrease HR to < 100,   continue Xarleto Rx (Patient was placed on Xarelto recently by Cardiologist, He does not know why;  I suspect it is for Atrial fibrillation) and Tenormin Rx.    3.  CAP-  Blood cultures done in ED, and placed on IV Rocephin and IV Azithromycin daily.    4.  HTN-  Continue Amlodipine, Tenormin, Losartan, and lasix Rx.  Monitor BPs.    5.  Hyperlipidemia-   Not currently on meds.    6.  IBS- takes  Librax PRN.  7.  Vitamin B12deficiency - take s monthly B12 Injections.       Code Status:   FULL CODE    Family Communication:   Wife at Bedside Disposition Plan:     Inpatient to Stepdown Bed    Time spent:  [facility] Pager [phone]  If 7PM-7AM, please contact night-coverage [email or web address] Password [email or web address] 03/24/2014, 3:12 AM

## 2014-03-24 NOTE — Progress Notes (Signed)
  Echocardiogram 2D Echocardiogram has been performed.  Ronald [name] 03/24/2014, 11:34 AM

## 2014-03-24 NOTE — ED Provider Notes (Signed)
CSN: [id number]     Arrival date & time 03/23/14  2310 History   First MD Initiated Contact with Patient 03/24/14 0105     Chief Complaint  Patient presents with  . Bloated     (Consider location/radiation/quality/duration/timing/severity/associated sxs/prior Treatment) HPI Comments: 80 year old male with history of high blood pressure, hyperlipidemia, irritable bowel, reflux presents with shortness of breath, cough and fever. Patient has had gradually worsening shortness of breath and bloating sensation in the past 2-3 months. He has had gradually worsening leg swelling bilateral without known weight gain. Patient is not having known cardiac history or CHF. Fever started today. Patient has had mildly productive cough recently. No recent hospitalization or lung disease. Shortness of breath mild with exertion. No history of arrhythmias or atrial fibrillation. No chest pain.  The history is provided by the patient.    Past Medical History  Diagnosis Date  . Irritable bowel syndrome   . Lactose intolerance   . Other and unspecified hyperlipidemia   . Reflux esophagitis   . Essential hypertension, benign   . Allergic rhinitis, cause unspecified   . Vitamin B12 deficiency   . BPH (benign prostatic hypertrophy)   . Personal history of colonic polyps 03/04/1992    adenomatous polyp  . Hiatal hernia 2014  . Schatzki's ring 2014   Past Surgical History  Procedure Laterality Date  . Prostate surgery    . Nose surgery     No family history on file. History  Substance Use Topics  . Smoking status: Never Smoker   . Smokeless tobacco: Not on file  . Alcohol Use: No    Review of Systems  Constitutional: Positive for fever and fatigue. Negative for chills.  HENT: Negative for congestion.   Eyes: Negative for visual disturbance.  Respiratory: Positive for cough and shortness of breath.   Cardiovascular: Positive for palpitations and leg swelling. Negative for chest pain.   Gastrointestinal: Negative for vomiting and abdominal pain.  Genitourinary: Negative for dysuria and flank pain.  Musculoskeletal: Negative for back pain, neck pain and neck stiffness.  Skin: Negative for rash.  Neurological: Negative for light-headedness and headaches.      Allergies  Review of patient's allergies indicates no known allergies.  Home Medications   Prior to Admission medications   Medication Sig Start Date End Date Taking? Authorizing Provider  amLODipine (NORVASC) 5 MG tablet Take 2.5 mg by mouth daily.    Yes Historical Provider, MD  atenolol (TENORMIN) 50 MG tablet Take 50 mg by mouth daily.   Yes Historical Provider, MD  clidinium-chlordiazePOXIDE (LIBRAX) 5-2.5 MG per capsule Take 1 capsule by mouth 2 (two) times daily as needed (ibs).   Yes Historical Provider, MD  furosemide (LASIX) 40 MG tablet Take 40 mg by mouth daily.   Yes Historical Provider, MD  losartan (COZAAR) 100 MG tablet Take 50 mg by mouth daily.   Yes Historical Provider, MD  potassium chloride SA (K-DUR,KLOR-CON) 20 MEQ tablet Take 20 mEq by mouth daily.   Yes Historical Provider, MD  Rivaroxaban (XARELTO) 15 MG TABS tablet Take 15 mg by mouth daily.   Yes Historical Provider, MD   BP 153/79  Pulse 144  Temp(Src) 101.6 F (38.7 C) (Oral)  Resp 24  Ht 6' (1.829 m)  Wt 239 lb 1 oz (108.438 kg)  BMI 32.42 kg/m2  SpO2 95% Physical Exam  Nursing note and vitals reviewed. Constitutional: He is oriented to person, place, and time. He appears well-developed and well-nourished.  HENT:  Head: Normocephalic and atraumatic.  Eyes: Conjunctivae are normal. Right eye exhibits no discharge. Left eye exhibits no discharge.  Neck: Normal range of motion. Neck supple. No tracheal deviation present.  Cardiovascular: An irregularly irregular rhythm present. Tachycardia present.   Pulmonary/Chest: Effort normal. He has rales (bilateral lower worse left).  Abdominal: Soft. He exhibits no distension. There  is tenderness (mild epigastric). There is no guarding.  Musculoskeletal: He exhibits edema (2+ bilateral lower extremities).  Neurological: He is alert and oriented to person, place, and time.  Skin: Skin is warm. No rash noted.  Psychiatric: He has a normal mood and affect.    ED Course  Procedures (including critical care time) CRITICAL CARE Performed by: Mariea Clonts Total critical care time: 35 min  Critical care time was exclusive of separately billable procedures and treating other patients.  Critical care was necessary to treat or prevent imminent or life-threatening deterioration.  Critical care was time spent personally by me on the following activities: development of treatment plan with patient and/or surrogate as well as nursing, discussions with consultants, evaluation of patient's response to treatment, examination of patient, obtaining history from patient or surrogate, ordering and performing treatments and interventions, ordering and review of laboratory studies, ordering and review of radiographic studies, pulse oximetry and re-evaluation of patient's condition.  Labs Review Labs Reviewed  CBC - Abnormal; Notable for the following:    Platelets 110 (*)    All other components within normal limits  BASIC METABOLIC PANEL - Abnormal; Notable for the following:    Glucose, Bld 116 (*)    Creatinine, Ser 1.47 (*)    GFR calc non Af Amer 44 (*)    GFR calc Af Amer 51 (*)    All other components within normal limits  PRO B NATRIURETIC PEPTIDE - Abnormal; Notable for the following:    Pro B Natriuretic peptide (BNP) 6491.0 (*)    All other components within normal limits  CULTURE, BLOOD (ROUTINE X 2)  CULTURE, BLOOD (ROUTINE X 2)  TROPONIN I  URINALYSIS, ROUTINE W REFLEX MICROSCOPIC    Imaging Review Dg Chest Port 1 View  03/24/2014   CLINICAL DATA:  Bloating sensation.  EXAM: PORTABLE CHEST - 1 VIEW  COMPARISON:  Chest radiograph from 11/15/2007  FINDINGS: The  lungs are well-aerated. Patchy left-sided airspace opacification, particularly about the left hilum, may reflect pneumonia. The right lung appears relatively clear. No pleural effusion or pneumothorax is seen.  The cardiomediastinal silhouette is borderline enlarged. No acute osseous abnormalities are seen.  IMPRESSION: 1. Patchy left-sided airspace opacification may reflect pneumonia. 2. Borderline cardiomegaly.   Electronically Signed   By: Garald Balding M.D.   On: 03/24/2014 01:18     EKG Interpretation   Date/Time:  Saturday Mar 23 2014 23:58:42 EDT Ventricular Rate:  156 PR Interval:    QRS Duration: 118 QT Interval:  272 QTC Calculation: 438 R Axis:   90 Text Interpretation:  Atrial fibrillation with rapid ventricular response  Rightward axis Incomplete right bundle branch block ST \\T \ T wave  abnormality, consider inferolateral ischemia Abnormal ECG Confirmed by  Carlyne Keehan  MD, Tyshell Ramberg ([id number]) on 03/24/2014 1:06:49 AM      MDM   Final diagnoses:  Atrial fibrillation with RVR  Community acquired pneumonia  Acute CHF  Sepsis   Clinical concern for new onset heart failure, new atrial fibrillation with rapid ventricular response and [facility] pneumonia. Chest x-ray reviewed confirming concerns for pneumonia and cardiomegaly. EKG showed atrial fibrillation  reviewed. Cardizem bolus and drip ordered and Tylenol for low-grade fever. Blood cultures, lactate, required antibiotics for pneumonia ordered. Cardizem drip adjusted for goal of heart rate around 100.  [facility] for [facility].  The patients results and plan were reviewed and discussed.   Any x-rays performed were personally reviewed by myself.   Differential diagnosis were considered with the presenting HPI.   Filed Vitals:   03/23/14 2331 03/23/14 2333 03/24/14 0119  BP: 145/93  153/79  Pulse: 127  144  Temp: 101 F (38.3 C)  101.6 F (38.7 C)  TempSrc: Oral  Oral  Resp: 18  24  Height:  6' (1.829 m)   Weight:   239 lb 1 oz (108.438 kg)   SpO2: 93%  95%    Admission/ observation were discussed with the admitting physician, patient and/or family and they are comfortable with the plan.     Mariea Clonts, MD 03/28/14 [phone]

## 2014-03-24 NOTE — Progress Notes (Signed)
CHF booklet reviewed with pt.

## 2014-03-24 NOTE — Progress Notes (Signed)
[facility] TEAM 1 - Stepdown/ICU TEAM Progress Note  Ronald [name]:[id number] DOB: May 29, 1934 DOA: 03/24/2014 PCP: Stephens Shire, MD  Admit HPI / Brief Narrative: Ronald [name] is a 80 y.o.BM PMHx HTN, GERD, IBS, who presents to the ED with complaints of worsening SOB and Lower Leg Swelling x3 months but worst for the past 3 days. He reports having DOE, and Orthopnea. He denies having chest pain but reports having palpitations. He also reports having fevers and chills over the past 24 hours. He was evaluated in the ED and was found to have a temperature to 101.6 and a chest x-ray tht revealed a mixed picture of cardiomegaly with CHF findings and a left mid-lung Infiltrate and a Pro-BNP of 6491.0. He was also found to be in Atrial fibrillation with RVR. He was placed on IV Antibiotic to cover CAP, and was also placed on an IV Cardizem drip due to the A.Fib with RVR, and administered IV Lasix X 1 and then referred for medical admission.    HPI/Subjective: 5/24 state 2 weeks increase SOB, nonproductive cough, increased bilateral lower extremity edema  Assessment/Plan:  Acute Diastolic CHF -Admission weight (bed) =107.3 kg - Continue Lasix 60 mg  BID -Echocardiogram pending - Continue losartan 50 mg daily  -Continue Tenormin( atenolol)  Atrial fibrillation with RVR - Placed on IV Cardizem Drip to titrate to decrease HR to < 100, (currently rate controlled) - continue Xarleto Rx (Patient was placed on Xarelto recently by Cardiologist, He does not know why; I suspect it is for Atrial fibrillation)  -Continue Tenormin( atenolol)Rx.   CAP - Blood cultures pending -Continue IV Rocephin and IV Azithromycin daily. -  Solu-Medrol 60 mg daily -Flutter valve q4hr while awake -Mucinex DM BID -Continue nebulizers  HTN - Continue Amlodipine, Tenormin, Losartan, and lasix Rx. Monitor BPs.   Hyperlipidemia - Not currently on meds. -Obtain lipid panel   IBS - takes Librax PRN.    Vitamin B12deficiency  - take s monthly B12 Injections.    Code Status: FULL Family Communication: no family present at time of exam Disposition Plan: Resolution pneumonia   Consultants: NA  Procedure/Significant Events: 5/23 PCXR Patchy left-sided airspace opacification pneumonia? Borderline cardiomegaly. 5/24 PCXR Left lower lobe pneumonia suspected     Culture 5/24 MRSA by PCR negative 5/24 blood pending    Antibiotics: Azithromycin 5/24>> Ceftriaxone 5/24>>   DVT prophylaxis: Xarelto   Devices NA   LINES / TUBES:  5/24 20 Ga right hand 5/24 18 Ga left antecubital    Continuous Infusions: . diltiazem (CARDIZEM) infusion 5 mg/hr (03/24/14 0700)    Objective: VITAL SIGNS: Temp: 99.7 F (37.6 C) (05/24 0804) Temp src: Oral (05/24 0804) BP: 98/68 mmHg (05/24 0825) Pulse Rate: 67 (05/24 0825) SPO2; 98% on 2 L via [place] FIO2:   Intake/Output Summary (Last 24 hours) at 03/24/14 0935 Last data filed at 03/24/14 8119  Gross per 24 hour  Intake     40 ml  Output    600 ml  Net   -560 ml     Exam: General: A./O. x4, NAD No acute respiratory distress Lungs: Clear to auscultation bilaterally with diffuse wheezes  Cardiovascular: Regular rate and rhythm without murmur gallop or rub normal S1 and S2 Abdomen: Nontender, nondistended, soft, bowel sounds positive, no rebound, no ascites, no appreciable mass Extremities: No significant cyanosis, clubbing, 2 -3+ pitting edema to hips bilateral   Data Reviewed: Basic Metabolic Panel:  Recent Labs Lab 03/24/14  NA 141  K 3.8  CL 104  CO2 25  GLUCOSE 116*  BUN 13  CREATININE 1.47*  CALCIUM 9.0   Liver Function Tests:  Recent Labs Lab 03/24/14 0240  AST 24  ALT 13  ALKPHOS 55  BILITOT 1.8*  PROT 6.0  ALBUMIN 3.2*   No results found for this basename: LIPASE, AMYLASE,  in the last 168 hours No results found for this basename: AMMONIA,  in the last 168 hours CBC:  Recent Labs Lab  03/24/14  WBC 5.4  HGB 15.2  HCT 46.6  MCV 91.7  PLT 110*   Cardiac Enzymes:  Recent Labs Lab 03/24/14  TROPONINI <0.30   BNP (last 3 results)  Recent Labs  03/24/14  PROBNP 6491.0*   CBG: No results found for this basename: GLUCAP,  in the last 168 hours  Recent Results (from the past 240 hour(s))  MRSA PCR SCREENING     Status: None   Collection Time    03/24/14  5:48 AM      Result Value Ref Range Status   MRSA by PCR NEGATIVE  NEGATIVE Final   Comment:            The GeneXpert MRSA Assay (FDA     approved for NASAL specimens     only), is one component of a     comprehensive MRSA colonization     surveillance program. It is not     intended to diagnose MRSA     infection nor to guide or     monitor treatment for     MRSA infections.     Studies:  Recent x-ray studies have been reviewed in detail by the Attending Physician  Scheduled Meds:  Scheduled Meds: . amLODipine  2.5 mg Oral Daily  . atenolol  50 mg Oral Daily  . azithromycin  500 mg Intravenous Q24H  . cefTRIAXone (ROCEPHIN)  IV  1 g Intravenous Q24H  . furosemide  40 mg Intravenous Q12H  . losartan  50 mg Oral Daily  . potassium chloride SA  20 mEq Oral Daily  . Rivaroxaban  15 mg Oral Daily  . sodium chloride  3 mL Intravenous Q12H    Time spent on care of this patient: 40 mins   [name] , MD   [facility] Office  [phone] Pager [phone]  On-Call/Text Page:      [name]      password [email or web address]  If 7PM-7AM, please contact night-coverage [email or web address] Password [email or web address] 03/24/2014, 9:35 AM   LOS: 0 days

## 2014-03-25 LAB — LIPID PANEL
Cholesterol: 86 mg/dL (ref 0–200)
HDL: 35 mg/dL — ABNORMAL LOW
LDL Cholesterol: 39 mg/dL (ref 0–99)
Total CHOL/HDL Ratio: 2.5 ratio
Triglycerides: 60 mg/dL
VLDL: 12 mg/dL (ref 0–40)

## 2014-03-25 LAB — COMPREHENSIVE METABOLIC PANEL WITH GFR
ALT: 13 U/L (ref 0–53)
AST: 23 U/L (ref 0–37)
Albumin: 3.1 g/dL — ABNORMAL LOW (ref 3.5–5.2)
Alkaline Phosphatase: 56 U/L (ref 39–117)
BUN: 15 mg/dL (ref 6–23)
CO2: 32 meq/L (ref 19–32)
Calcium: 8.1 mg/dL — ABNORMAL LOW (ref 8.4–10.5)
Chloride: 97 meq/L (ref 96–112)
Creatinine, Ser: 1.25 mg/dL (ref 0.50–1.35)
GFR calc Af Amer: 62 mL/min — ABNORMAL LOW
GFR calc non Af Amer: 53 mL/min — ABNORMAL LOW
Glucose, Bld: 145 mg/dL — ABNORMAL HIGH (ref 70–99)
Potassium: 3.7 meq/L (ref 3.7–5.3)
Sodium: 139 meq/L (ref 137–147)
Total Bilirubin: 1.1 mg/dL (ref 0.3–1.2)
Total Protein: 6 g/dL (ref 6.0–8.3)

## 2014-03-25 LAB — CBC WITH DIFFERENTIAL/PLATELET
Basophils Absolute: 0 10*3/uL (ref 0.0–0.1)
Basophils Relative: 0 % (ref 0–1)
EOS ABS: 0 10*3/uL (ref 0.0–0.7)
Eosinophils Relative: 0 % (ref 0–5)
HCT: 45.1 % (ref 39.0–52.0)
HEMOGLOBIN: 14.4 g/dL (ref 13.0–17.0)
LYMPHS ABS: 0.3 10*3/uL — AB (ref 0.7–4.0)
Lymphocytes Relative: 8 % — ABNORMAL LOW (ref 12–46)
MCH: 29.7 pg (ref 26.0–34.0)
MCHC: 31.9 g/dL (ref 30.0–36.0)
MCV: 93 fL (ref 78.0–100.0)
MONOS PCT: 2 % — AB (ref 3–12)
Monocytes Absolute: 0.1 10*3/uL (ref 0.1–1.0)
NEUTROS ABS: 3.4 10*3/uL (ref 1.7–7.7)
NEUTROS PCT: 90 % — AB (ref 43–77)
Platelets: 101 10*3/uL — ABNORMAL LOW (ref 150–400)
RBC: 4.85 MIL/uL (ref 4.22–5.81)
RDW: 13.2 % (ref 11.5–15.5)
WBC: 3.8 10*3/uL — ABNORMAL LOW (ref 4.0–10.5)

## 2014-03-25 LAB — MAGNESIUM: Magnesium: 1.8 mg/dL (ref 1.5–2.5)

## 2014-03-25 MED ORDER — DILTIAZEM HCL 30 MG PO TABS
30.0000 mg | ORAL_TABLET | Freq: Four times a day (QID) | ORAL | Status: DC
  Administered 2014-03-25 – 2014-03-26 (×5): 30 mg via ORAL
  Filled 2014-03-25 (×8): qty 1

## 2014-03-25 MED ORDER — AZITHROMYCIN 250 MG PO TABS
250.0000 mg | ORAL_TABLET | Freq: Every day | ORAL | Status: DC
  Administered 2014-03-25 – 2014-03-29 (×5): 250 mg via ORAL
  Filled 2014-03-25 (×5): qty 1

## 2014-03-25 MED ORDER — FUROSEMIDE 10 MG/ML IJ SOLN
INTRAMUSCULAR | Status: AC
  Filled 2014-03-25: qty 8

## 2014-03-25 MED ORDER — RIVAROXABAN 20 MG PO TABS
20.0000 mg | ORAL_TABLET | Freq: Every day | ORAL | Status: DC
  Administered 2014-03-26 – 2014-03-29 (×4): 20 mg via ORAL
  Filled 2014-03-25 (×4): qty 1

## 2014-03-25 MED ORDER — FUROSEMIDE 10 MG/ML IJ SOLN
40.0000 mg | Freq: Two times a day (BID) | INTRAMUSCULAR | Status: AC
  Administered 2014-03-25: 40 mg via INTRAVENOUS

## 2014-03-25 MED ORDER — LEVALBUTEROL HCL 0.63 MG/3ML IN NEBU: 0.6300 mg | INHALATION_SOLUTION | RESPIRATORY_TRACT | Status: DC | PRN

## 2014-03-25 MED ORDER — LOSARTAN POTASSIUM 25 MG PO TABS
25.0000 mg | ORAL_TABLET | Freq: Every day | ORAL | Status: DC
  Administered 2014-03-26 – 2014-03-29 (×4): 25 mg via ORAL
  Filled 2014-03-25 (×4): qty 1

## 2014-03-25 NOTE — Progress Notes (Signed)
Physician notified: McClung At: 1228  Regarding: HR 58, sys BP 94. Stopped Cardizem gtt per protocol. In AF. Thanks.

## 2014-03-25 NOTE — Progress Notes (Signed)
Patient with increasing labored breathing, tachypnea, lungs coarse crackles in bases and prominent expiratory wheezing throughout. Patient restless, no complaints of pain. Patient appears fatigued. Fredirick Maudlin, NP notified. New orders received. RT notified of Bipap order. Patient notified of POC and agreed with Bipap. Will continue to monitor.

## 2014-03-25 NOTE — Progress Notes (Signed)
Robyn Haber, patient's wife, and updated her on patient's condition and current POC.

## 2014-03-25 NOTE — Progress Notes (Signed)
Patient with dyspnea at rest, labored breathing, prominent wheezing throughout lungs. Elevated BP. Fredirick Maudlin, NP notified. New orders received. RT notified of new treatment orders. Patient to receive neb treatments. Will continue to monitor.

## 2014-03-25 NOTE — Plan of Care (Signed)
Problem: Consults Goal: Heart Failure Patient Education (See Patient Education module for education specifics.)  Outcome: Progressing Educated patient about importance of daily weights and monitoring total amount of fluid intake per day. Also discussed with patient nutrition and eliminating or restricting salt intake. Verbalized understanding and utilized teachback method.

## 2014-03-25 NOTE — Discharge Instructions (Addendum)
Information on my medicine - XARELTO (Rivaroxaban)  This medication education was reviewed with me or my healthcare representative as part of my discharge preparation.  The pharmacist that spoke with me during my hospital stay was:  Lorenda Peck, [place]  Why was Xarelto prescribed for you? Xarelto was prescribed for you to reduce the risk of a blood clot forming that can cause a stroke if you have a medical condition called atrial fibrillation (a type of irregular heartbeat).  What do you need to know about xarelto ? Take your Xarelto ONCE DAILY at the same time every day with your evening meal. If you have difficulty swallowing the tablet whole, you may crush it and mix in applesauce just prior to taking your dose.  Take Xarelto exactly as prescribed by your doctor and DO NOT stop taking Xarelto without talking to the doctor who prescribed the medication.  Stopping without other stroke prevention medication to take the place of Xarelto may increase your risk of developing a clot that causes a stroke.  Refill your prescription before you run out.  After discharge, you should have regular check-up appointments with your healthcare provider that is prescribing your Xarelto.  In the future your dose may need to be changed if your kidney function or weight changes by a significant amount.  What do you do if you miss a dose? If you are taking Xarelto ONCE DAILY and you miss a dose, take it as soon as you remember on the same day then continue your regularly scheduled once daily regimen the next day. Do not take two doses of Xarelto at the same time or on the same day.   Important Safety Information A possible side effect of Xarelto is bleeding. You should call your healthcare provider right away if you experience any of the following:   Bleeding from an injury or your nose that does not stop.   Unusual colored urine (red or dark brown) or unusual colored stools (red or black).   Unusual  bruising for unknown reasons.   A serious fall or if you hit your head (even if there is no bleeding).  Some medicines may interact with Xarelto and might increase your risk of bleeding while on Xarelto. To help avoid this, consult your healthcare provider or pharmacist prior to using any new prescription or non-prescription medications, including herbals, vitamins, non-steroidal anti-inflammatory drugs (NSAIDs) and supplements.  This website has more information on Xarelto: [email or web address] Furosemide tablets What is this medicine? FUROSEMIDE (fyoor [place] se mide) is a diuretic. It helps you make more urine and to lose salt and excess water from your body. This medicine is used to treat high blood pressure, and edema or swelling from heart, kidney, or liver disease. This medicine may be used for other purposes; ask your health care provider or pharmacist if you have questions. COMMON BRAND NAME(S): Delone , Lasix What should I tell my health care provider before I take this medicine? They need to know if you have any of these conditions: -abnormal blood electrolytes -diarrhea or vomiting -gout -heart disease -kidney disease, small amounts of urine, or difficulty passing urine -liver disease -an unusual or allergic reaction to furosemide, sulfa drugs, other medicines, foods, dyes, or preservatives -pregnant or trying to get pregnant -breast-feeding How should I use this medicine? Take this medicine by mouth with a glass of water. Follow the directions on the prescription label. You may take this medicine with or without food. If it upsets your stomach,  take it with food or milk. Do not take your medicine more often than directed. Remember that you will need to pass more urine after taking this medicine. Do not take your medicine at a time of day that will cause you problems. Do not take at bedtime. Talk to your pediatrician regarding the use of this medicine in children. While this drug may  be prescribed for selected conditions, precautions do apply. Overdosage: If you think you have taken too much of this medicine contact a poison control center or emergency room at once. NOTE: This medicine is only for you. Do not share this medicine with others. What if I miss a dose? If you miss a dose, take it as soon as you can. If it is almost time for your next dose, take only that dose. Do not take double or extra doses. What may interact with this medicine? -aspirin and aspirin-like medicines -certain antibiotics -chloral hydrate -cisplatin -cyclosporine -digoxin -diuretics -laxatives -lithium -medicines for blood pressure -medicines that relax muscles for surgery -methotrexate -NSAIDs, medicines for pain and inflammation like ibuprofen, naproxen, or indomethacin -phenytoin -steroid medicines like prednisone or cortisone -sucralfate This list may not describe all possible interactions. Give your health care provider a list of all the medicines, herbs, non-prescription drugs, or dietary supplements you use. Also tell them if you smoke, drink alcohol, or use illegal drugs. Some items may interact with your medicine. What should I watch for while using this medicine? Visit your doctor or health care professional for regular checks on your progress. Check your blood pressure regularly. Ask your doctor or health care professional what your blood pressure should be, and when you should contact him or her. If you are a diabetic, check your blood sugar as directed. You may need to be on a special diet while taking this medicine. Check with your doctor. Also, ask how many glasses of fluid you need to drink a day. You must not get dehydrated. You may get drowsy or dizzy. Do not drive, use machinery, or do anything that needs mental alertness until you know how this drug affects you. Do not stand or sit up quickly, especially if you are an older patient. This reduces the risk of dizzy or  fainting spells. Alcohol can make you more drowsy and dizzy. Avoid alcoholic drinks. This medicine can make you more sensitive to the sun. Keep out of the sun. If you cannot avoid being in the sun, wear protective clothing and use sunscreen. Do not use sun lamps or tanning beds/booths. What side effects may I notice from receiving this medicine? Side effects that you should report to your doctor or health care professional as soon as possible: -blood in urine or stools -dry mouth -fever or chills -hearing loss or ringing in the ears -irregular heartbeat -muscle pain or weakness, cramps -skin rash -stomach upset, pain, or nausea -tingling or numbness in the hands or feet -unusually weak or tired -vomiting or diarrhea -yellowing of the eyes or skin Side effects that usually do not require medical attention (report to your doctor or health care professional if they continue or are bothersome): -headache -loss of appetite -unusual bleeding or bruising This list may not describe all possible side effects. Call your doctor for medical advice about side effects. You may report side effects to FDA at 1-800-FDA-1088. Where should I keep my medicine? Keep out of the reach of children. Store at room temperature between 15 and 30 degrees C (59 and 86 degrees F).  Protect from light. Throw away any unused medicine after the expiration date. NOTE: This sheet is a summary. It may not cover all possible information. If you have questions about this medicine, talk to your doctor, pharmacist, or health care provider.  2014, Elsevier/Gold Standard. (2009-10-06 16:24:50) Atrial Fibrillation Atrial fibrillation is a condition that causes your heart to beat irregularly. It may also cause your heart to beat faster than normal. Atrial fibrillation can prevent your heart from pumping blood normally. It increases your risk of stroke and heart problems. HOME CARE  Take medications as told by your doctor.  Only  take medications that your doctor says are safe. Some medications can make the condition worse or happen again.  If blood thinners were prescribed by your doctor, take them exactly as told. Too much can cause bleeding. Too little and you will not have the needed protection against stroke and other problems.  Perform blood tests at home if told by your doctor.  Perform blood tests exactly as told by your doctor.  Do not drink alcohol.  Do not drink beverages with caffeine such as coffee, soda, and some teas.  Maintain a healthy weight.  Do not use diet pills unless your doctor says they are safe. They may make heart problems worse.  Follow diet instructions as told by your doctor.  Exercise regularly as told by your doctor.  Keep all follow-up appointments. GET HELP RIGHT AWAY IF:   You have chest or belly (abdominal) pain.  You feel sick to your stomach (nauseous)  You suddenly have swollen feet and ankles.  You feel dizzy.  You face, arms, or legs feel numb or weak.  There is a change in your vision or speech.  You notice a change in the speed, rhythm, or strength of your heartbeat.  You suddenly begin peeing (urinating) more often.  You get tired more easily when moving or exercising. MAKE SURE YOU:   Understand these instructions.  Will watch your condition.  Will get help right away if you are not doing well or get worse. Document Released: 07/27/2008 Document Revised: 02/12/2013 Document Reviewed: 11/28/2012 [employer] Patient Information 2014 [employer], [place]. 1.5 Gram Low Sodium Diet A 1.5 gram sodium diet restricts the amount of salt in your diet. You can have no more than 1.5 grams (1500 miligrams) in 1 day. This can help lessen your risk for developing high blood pressure. This diet may also reduce your chance of having a heart attack or stroke. It is important that you know what to look for when choosing foods and drinks.  HOME CARE   Do not add salt to  food.  Avoid convenience items and fast food.  Choose unsalted snack foods.  Buy products labeled "low sodium" or "no salt added" when possible.  Check food labels to learn how much sodium is in 1 serving.  When eating at a restaurant, ask that your food be prepared with less salt or none, if possible. The nutrition facts label is a good place to find how much sodium is in foods. Look for products with no more than 400 mg of sodium per serving. Remember that 1.5 g = 1500 mg. CHOOSING FOODS Grains  Avoid: Salted crackers and snack items. Some cereals, including instant hot cereals. Bread stuffing and biscuit mixes. Seasoned rice or pasta mixes.  Choose: Unsalted snack items. Low-sodium cereals, oats, puffed wheat and rice, shredded wheat. English muffins and bread. Pasta. Meats  Avoid:  Salted, canned, smoked, spiced, pickled  meats, including fish and poultry. Bacon, ham, sausage, cold cuts, hot dogs, anchovies.  Choose: Low-sodium canned tuna and salmon. Fresh or frozen meat, poultry, and fish. Dairy  Avoid: Processed cheese and spreads. Cottage cheese. Buttermilk and condensed milk. Regular cheese.  Choose:  Milk. Low-sodium cottage cheese. Yogurt. Sour cream. Low-sodium cheese. Fruits and Vegetables  Avoid:  Regular canned vegetables. Regular canned tomato sauce and paste. Frozen vegetables in sauces. Olives. Angie Fava. Relishes. Sauerkraut.  Choose:  Low-sodium canned vegetables. Low-sodium tomato sauce and paste. Frozen or fresh vegetables. Fresh and frozen fruit. Condiments  Avoid:  Canned and packaged gravies. Worcestershire sauce. Tartar sauce. Barbecue sauce. Soy sauce. Steak sauce. Ketchup. Onion, garlic, and table salt. Meat flavorings and tenderizers.  Choose:  Fresh and dried herbs and spices. Low-sodium varieties of mustard and ketchup. Lemon juice. Tabasco sauce. Horseradish. Document Released: 11/20/2010 Document Revised: 01/10/2012 Document Reviewed:  11/20/2010 [facility] Patient Information 2014 [place], [place]. Heart Failure Heart failure means your heart has trouble pumping blood. This makes it hard for your body to work well. Heart failure is usually a long-term (chronic) condition. You must take good care of yourself and follow your doctor's treatment plan. HOME CARE  Take your heart medicine as told by your doctor.  Do not stop taking medicine unless your doctor tells you to.  Do not skip any dose of medicine.  Refill your medicines before they run out.  Take other medicines only as told by your doctor or pharmacist.  Stay active if told by your doctor. The elderly and people with severe heart failure should talk with a doctor about physical activity.  Eat heart healthy foods. Choose foods that are without trans fat and are low in saturated fat, cholesterol, and salt (sodium). This includes fresh or frozen fruits and vegetables, fish, lean meats, fat-free or low-fat dairy foods, whole grains, and high-fiber foods. Lentils and dried peas and beans (legumes) are also good choices.  Limit salt if told by your doctor.  Cook in a healthy way. Roast, grill, broil, bake, poach, steam, or stir-fry foods.  Limit fluids as told by your doctor.  Weigh yourself every morning. Do this after you pee (urinate) and before you eat breakfast. Write down your weight to give to your doctor.  Take your blood pressure and write it down if your doctor tell you to.  Ask your doctor how to check your pulse. Check your pulse as told.  Lose weight if told by your doctor.  Stop smoking or chewing tobacco. Do not use gum or patches that help you quit without your doctor's approval.  Schedule and go to doctor visits as told.  Nonpregnant women should have no more than 1 drink a day. Men should have no more than 2 drinks a day. Talk to your doctor about drinking alcohol.  Stop illegal drug use.  Stay current with shots (immunizations).  Manage  your health conditions as told by your doctor.  Learn to manage your stress.  Rest when you are tired.  If it is really hot outside:  Avoid intense activities.  Use air conditioning or fans, or get in a cooler place.  Avoid caffeine and alcohol.  Wear loose-fitting, lightweight, and light-colored clothing.  If it is really cold outside:  Avoid intense activities.  Layer your clothing.  Wear mittens or gloves, a hat, and a scarf when going outside.  Avoid alcohol.  Learn about heart failure and get support as needed.  Get help to maintain or improve  your quality of life and your ability to care for yourself as needed. GET HELP IF:   You gain 03 lb/1.4 kg or more in 1 day or 05 lb/2.3 kg in a week.  You are more short of breath than usual.  You cannot do your normal activities.  You tire easily.  You cough more than normal, especially with activity.  You have any or more puffiness (swelling) in areas such as your hands, feet, ankles, or belly (abdomen).  You cannot sleep because it is hard to breathe.  You feel like your heart is beating fast (palpitations).  You get dizzy or lightheaded when you stand up. GET HELP RIGHT AWAY IF:   You have trouble breathing.  There is a change in mental status, such as becoming less alert or not being able to focus.  You have chest pain or discomfort.  You faint. MAKE SURE YOU:   Understand these instructions.  Will watch your condition.  Will get help right away if you are not doing well or get worse. Document Released: 07/27/2008 Document Revised: 02/12/2013 Document Reviewed: 05/18/2012 [facility] Patient Information 2014 [place], [place].

## 2014-03-25 NOTE — Progress Notes (Signed)
Patient received RT neb treatment. Patient remains with significant wheezing and labored breathing. BP normalized. Fredirick Maudlin, NP notified. New orders received. Will call with results.

## 2014-03-25 NOTE — Progress Notes (Signed)
[place] TEAM 1 - Stepdown/ICU TEAM Progress Note  Ronald [name] [id number] DOB: 02-Sep-1934 DOA: 03/24/2014 PCP: Stephens Shire, MD  Admit HPI / Brief Narrative: 80 y.o. male with a history of HTN, GERD, and IBS, who presented to the ED with complaints of worsening SOB and Leg Swelling x3 months but worst for the prededing 3 days. He reported having DOE, and Orthopnea. He denied chest pain but reported palpitations. He also reported having fevers and chills. He was evaluated in the ED and was found to have a temperature to 101.6 and a chest x-ray revealed a mixed picture of cardiomegaly with CHF findings and a left mid-lung Infiltrate. He was also found to be in Atrial fibrillation with RVR. He was placed on IV Antibiotic to cover CAP, and was also placed on an IV Cardizem drip due to the A.Fib with RVR, and administered IV Lasix X 1 and then referred for medical admission.   HPI/Subjective: Pt is feeling better overall.  He states he has noted signif improvement in the swelling in his legs.  He denies current CP or sob.    Assessment/Plan:  Afib w/ RVR Rate now well controlled - suspect afib is chronic as pt was on Xarelto at admit - appears to be followed by Dr. Einar Gip as outpt - will consult him if problems arise  LLL CAP afeb last 24hrs - continues to require supplemental O2 - cont empiric abx tx   Pulmonary edema  TTE pending - cont to diurese and follow Is/Os  HTN BP well controlled/borderline low - follow w/ gently diuresis   IBS Quiescent   Reported hx of Hyperlipidemia  Lipid panel actually quite favorable - ?accuracy of this diagnosis   Vitamin B12 deficiency   Hyperglycemia No reported hx of DM - check A1c  Code Status: FULL Family Communication: spoke w/ wife at bedside at length  Disposition Plan: SDU overnight - probable tele transfer 5/26  Consultants: none  Procedures: none  Antibiotics: Azithromycin 5/24>>  Ceftriaxone 5/24>>  DVT  prophylaxis: xarelto  Objective: Blood pressure 119/67, pulse 77, temperature 97.6 F (36.4 C), temperature source Oral, resp. rate 23, height 6\' 2"  (1.88 m), weight 104.5 kg (230 lb 6.1 oz), SpO2 96.00%.  Intake/Output Summary (Last 24 hours) at 03/25/14 1603 Last data filed at 03/25/14 1532  Gross per 24 hour  Intake 1243.33 ml  Output   4100 ml  Net -2856.67 ml   Exam: General: No acute respiratory distress in bed  Lungs: bibasilar crackles L > R - no wheeze  Cardiovascular: irreg irreg - rate controlled at 80 - no appreciable gallup or rub or M Abdomen: Nontender, nondistended, soft, bowel sounds positive, no rebound, no ascites, no appreciable mass Extremities: No significant cyanosis, or clubbing;  1+ edema bilateral lower extremities  Data Reviewed: Basic Metabolic Panel:  Recent Labs Lab 03/24/14 03/25/14 0235  NA 141 139  K 3.8 3.7  CL 104 97  CO2 25 32  GLUCOSE 116* 145*  BUN 13 15  CREATININE 1.47* 1.25  CALCIUM 9.0 8.1*  MG  --  1.8   Liver Function Tests:  Recent Labs Lab 03/24/14 0240 03/25/14 0235  AST 24 23  ALT 13 13  ALKPHOS 55 56  BILITOT 1.8* 1.1  PROT 6.0 6.0  ALBUMIN 3.2* 3.1*   CBC:  Recent Labs Lab 03/24/14 03/25/14 0235  WBC 5.4 3.8*  NEUTROABS  --  3.4  HGB 15.2 14.4  HCT 46.6 45.1  MCV 91.7 93.0  PLT 110* 101*   Cardiac Enzymes:  Recent Labs Lab 03/24/14  TROPONINI <0.30   BNP (last 3 results)  Recent Labs  03/24/14  PROBNP 6491.0*   CBG: No results found for this basename: GLUCAP,  in the last 168 hours  Recent Results (from the past 240 hour(s))  CULTURE, BLOOD (ROUTINE X 2)     Status: None   Collection Time    03/24/14  1:30 AM      Result Value Ref Range Status   Specimen Description BLOOD LEFT HAND   Final   Special Requests BOTTLES DRAWN AEROBIC AND ANAEROBIC 10CC EA   Final   Culture  Setup Time     Final   Value: 03/24/2014 09:46     Performed at [facility]   Culture     Final    Value:        BLOOD CULTURE RECEIVED NO GROWTH TO DATE CULTURE WILL BE HELD FOR 5 DAYS BEFORE ISSUING A FINAL NEGATIVE REPORT     Performed at [facility]   Report Status PENDING   Incomplete  CULTURE, BLOOD (ROUTINE X 2)     Status: None   Collection Time    03/24/14  1:40 AM      Result Value Ref Range Status   Specimen Description BLOOD RIGHT HAND   Final   Special Requests BOTTLES DRAWN AEROBIC ONLY 10CC   Final   Culture  Setup Time     Final   Value: 03/24/2014 09:46     Performed at [facility]   Culture     Final   Value:        BLOOD CULTURE RECEIVED NO GROWTH TO DATE CULTURE WILL BE HELD FOR 5 DAYS BEFORE ISSUING A FINAL NEGATIVE REPORT     Performed at [facility]   Report Status PENDING   Incomplete  MRSA PCR SCREENING     Status: None   Collection Time    03/24/14  5:48 AM      Result Value Ref Range Status   MRSA by PCR NEGATIVE  NEGATIVE Final   Comment:            The GeneXpert MRSA Assay (FDA     approved for NASAL specimens     only), is one component of a     comprehensive MRSA colonization     surveillance program. It is not     intended to diagnose MRSA     infection nor to guide or     monitor treatment for     MRSA infections.     Studies:  Recent x-ray studies have been reviewed in detail by the Attending Physician  Scheduled Meds:  Scheduled Meds: . amLODipine  2.5 mg Oral Daily  . atenolol  50 mg Oral Daily  . azithromycin  500 mg Intravenous Q24H  . budesonide (PULMICORT) nebulizer solution  0.5 mg Nebulization BID  . cefTRIAXone (ROCEPHIN)  IV  1 g Intravenous Q24H  . dextromethorphan-guaiFENesin  1 tablet Oral BID  . furosemide  60 mg Intravenous Q12H  . levalbuterol  0.63 mg Nebulization Q6H  . losartan  50 mg Oral Daily  . methylPREDNISolone (SOLU-MEDROL) injection  60 mg Intravenous Q24H  . potassium chloride SA  20 mEq Oral Daily  . Rivaroxaban  15 mg Oral Daily  . sodium chloride  3 mL Intravenous Q12H     Time spent on care of this patient: 35 mins  Cherene Altes , MD   [facility] Office  [phone] Pager - Text Page per Amion as per below:  On-Call/Text Page:      [name]      password [email or web address]  If 7PM-7AM, please contact night-coverage [email or web address] Password [email or web address] 03/25/2014, 4:03 PM   LOS: 1 day

## 2014-03-25 NOTE — Progress Notes (Signed)
Physician notified: Thereasa Solo At: 72  Regarding: FYI pt HR 100-115 Afib. Cardizem gtt DC, want anything PO? Thanks

## 2014-03-25 NOTE — Plan of Care (Signed)
Problem: Phase I Progression Outcomes Goal: EF % per last Echo/documented,Core Reminder form on chart Outcome: Progressing Ordered. Awaiting ECHO completion.

## 2014-03-26 DIAGNOSIS — I5022 Chronic systolic (congestive) heart failure: Secondary | ICD-10-CM | POA: Diagnosis present

## 2014-03-26 DIAGNOSIS — I5023 Acute on chronic systolic (congestive) heart failure: Secondary | ICD-10-CM | POA: Diagnosis present

## 2014-03-26 LAB — BASIC METABOLIC PANEL
BUN: 22 mg/dL (ref 6–23)
CO2: 33 meq/L — AB (ref 19–32)
Calcium: 8.6 mg/dL (ref 8.4–10.5)
Chloride: 101 mEq/L (ref 96–112)
Creatinine, Ser: 1.22 mg/dL (ref 0.50–1.35)
GFR calc Af Amer: 64 mL/min — ABNORMAL LOW (ref >90)
GFR, EST NON AFRICAN AMERICAN: 55 mL/min — AB (ref >90)
Glucose, Bld: 136 mg/dL — ABNORMAL HIGH (ref 70–99)
Potassium: 3.9 mEq/L (ref 3.7–5.3)
SODIUM: 143 meq/L (ref 137–147)

## 2014-03-26 LAB — CBC
HCT: 44.8 % (ref 39.0–52.0)
Hemoglobin: 14.2 g/dL (ref 13.0–17.0)
MCH: 29.2 pg (ref 26.0–34.0)
MCHC: 31.7 g/dL (ref 30.0–36.0)
MCV: 92 fL (ref 78.0–100.0)
Platelets: 114 10*3/uL — ABNORMAL LOW (ref 150–400)
RBC: 4.87 MIL/uL (ref 4.22–5.81)
RDW: 12.5 % (ref 11.5–15.5)
WBC: 4.9 10*3/uL (ref 4.0–10.5)

## 2014-03-26 LAB — HEMOGLOBIN A1C
Hgb A1c MFr Bld: 5.8 % — ABNORMAL HIGH (ref <5.7)
Mean Plasma Glucose: 120 mg/dL — ABNORMAL HIGH (ref <117)

## 2014-03-26 MED ORDER — ISOSORB DINITRATE-HYDRALAZINE 20-37.5 MG PO TABS
1.0000 | ORAL_TABLET | Freq: Three times a day (TID) | ORAL | Status: DC
  Administered 2014-03-26 – 2014-03-29 (×8): 1 via ORAL
  Filled 2014-03-26 (×11): qty 1

## 2014-03-26 MED ORDER — FUROSEMIDE 20 MG PO TABS
20.0000 mg | ORAL_TABLET | Freq: Every morning | ORAL | Status: DC
  Administered 2014-03-27 – 2014-03-29 (×3): 20 mg via ORAL
  Filled 2014-03-26 (×3): qty 1

## 2014-03-26 MED ORDER — METOPROLOL TARTRATE 25 MG PO TABS
25.0000 mg | ORAL_TABLET | Freq: Two times a day (BID) | ORAL | Status: DC
  Administered 2014-03-27: 25 mg via ORAL
  Filled 2014-03-26 (×2): qty 1

## 2014-03-26 MED ORDER — METOPROLOL TARTRATE 50 MG PO TABS
50.0000 mg | ORAL_TABLET | Freq: Two times a day (BID) | ORAL | Status: DC
  Filled 2014-03-26: qty 1

## 2014-03-26 MED ORDER — DILTIAZEM HCL 30 MG PO TABS
30.0000 mg | ORAL_TABLET | Freq: Three times a day (TID) | ORAL | Status: DC | PRN
  Filled 2014-03-26: qty 1

## 2014-03-26 MED ORDER — LEVALBUTEROL HCL 0.63 MG/3ML IN NEBU
0.6300 mg | INHALATION_SOLUTION | Freq: Three times a day (TID) | RESPIRATORY_TRACT | Status: DC
  Administered 2014-03-27 – 2014-03-28 (×4): 0.63 mg via RESPIRATORY_TRACT
  Filled 2014-03-26 (×8): qty 3

## 2014-03-26 NOTE — Progress Notes (Signed)
[place] TEAM 1 - Stepdown/ICU TEAM Progress Note  Ronald [name] [id number] DOB: Sep 28, 1934 DOA: 03/24/2014 PCP: Stephens Shire, MD  Admit HPI / Brief Narrative: 80 y.o. male with a history of HTN, GERD, and IBS, who presented to the ED with complaints of worsening SOB and Leg Swelling x3 months but worst for the prededing 3 days. He reported having DOE, and Orthopnea. He denied chest pain but reported palpitations. He also reported having fevers and chills. He was evaluated in the ED and was found to have a temperature to 101.6 and a chest x-ray revealed a mixed picture of cardiomegaly with CHF findings and a left mid-lung Infiltrate. He was also found to be in Atrial fibrillation with RVR. He was placed on IV Antibiotic to cover CAP, and was also placed on an IV Cardizem drip due to the A.Fib with RVR, and administered IV Lasix X 1 and then referred for medical admission.   HPI/Subjective: Pt is feeling better overall.  He states he has noted signif improvement in the swelling in his legs.  He denies current CP or sob.    Assessment/Plan:  Afib w/ RVR Rate now well controlled - suspect afib is chronic as pt was on Xarelto at admit - sees Dr. Woody Seller as outpt - will consult him (see below)  LLL CAP afebrile last 24hrs - continues to require supplemental O2 - cont empiric abx tx -suspect issues with CHF primary etiology to resp failure  Pulmonary edema  TTE with diffuse hypokinesis and EF 25% with RA/RV dilatation- cont to diurese and follow Is/Os-consult Cards-likely HTN etiology but if not yet done needs ischemic eval  Systolic CHF -This is new onset per the patient and has never been evaluated by a cardiologist; consult cardiology  Mild pulmonary HTN/moderate to severe TR See systolic CHF  HTN BP well controlled/borderline low - follow w/ gently diuresis   IBS Quiescent   Reported hx of Hyperlipidemia  Lipid panel actually quite favorable - ?accuracy of this diagnosis    Vitamin B12 deficiency   Hyperglycemia No reported hx of DM - check A1c  Code Status: FULL Family Communication: No family at bedside Disposition Plan: SDU  Consultants: Consult Dr. Einar Gip with cardiology  Procedures: TTE - Left ventricle: The cavity size was normal. Wall thickness was normal. Systolic function was severely reduced. The estimated ejection fraction was in the range of 25% to 30%. Diffuse hypokinesis. - Mitral valve: There was mild regurgitation. - Left atrium: The atrium was moderately dilated. - Right ventricle: The cavity size was moderately dilated. - Right atrium: The atrium was severely dilated. - Atrial septum: No defect or patent foramen ovale was identified. - Tricuspid valve: There was moderate-severe regurgitation. - Pulmonary arteries: PA peak pressure: 33 mm Hg (S).   Antibiotics: Azithromycin 5/24>>  Ceftriaxone 5/24>>  DVT prophylaxis: xarelto  Objective: Blood pressure 130/83, pulse 68, temperature 97.9 F (36.6 C), temperature source Oral, resp. rate 16, height 6' (1.829 m), weight 227 lb 14.4 oz (103.375 kg), SpO2 97.00%.  Intake/Output Summary (Last 24 hours) at 03/26/14 1207 Last data filed at 03/26/14 0441  Gross per 24 hour  Intake  752.5 ml  Output   1350 ml  Net -597.5 ml   Exam: General: No acute respiratory distress in bed  Lungs: bibasilar crackles L > R - no wheeze , 6L Cardiovascular: irreg irreg - rate controlled at 80 - no appreciable gallup or rub or M Abdomen: Nontender, nondistended, soft, bowel sounds positive, no  rebound, no ascites, no appreciable mass Extremities: No significant cyanosis, or clubbing;  Continues with 1+ edema bilateral lower extremities  Data Reviewed: Basic Metabolic Panel:  Recent Labs Lab 03/24/14 03/25/14 0235 03/26/14 0329  NA 141 139 143  K 3.8 3.7 3.9  CL 104 97 101  CO2 25 32 33*  GLUCOSE 116* 145* 136*  BUN 13 15 22   CREATININE 1.47* 1.25 1.22  CALCIUM 9.0 8.1* 8.6  MG   --  1.8  --    Liver Function Tests:  Recent Labs Lab 03/24/14 0240 03/25/14 0235  AST 24 23  ALT 13 13  ALKPHOS 55 56  BILITOT 1.8* 1.1  PROT 6.0 6.0  ALBUMIN 3.2* 3.1*   CBC:  Recent Labs Lab 03/24/14 03/25/14 0235 03/26/14 0329  WBC 5.4 3.8* 4.9  NEUTROABS  --  3.4  --   HGB 15.2 14.4 14.2  HCT 46.6 45.1 44.8  MCV 91.7 93.0 92.0  PLT 110* 101* 114*   Cardiac Enzymes:  Recent Labs Lab 03/24/14  TROPONINI <0.30   BNP (last 3 results)  Recent Labs  03/24/14  PROBNP 6491.0*   CBG: No results found for this basename: GLUCAP,  in the last 168 hours  Recent Results (from the past 240 hour(s))  CULTURE, BLOOD (ROUTINE X 2)     Status: None   Collection Time    03/24/14  1:30 AM      Result Value Ref Range Status   Specimen Description BLOOD LEFT HAND   Final   Special Requests BOTTLES DRAWN AEROBIC AND ANAEROBIC 10CC EA   Final   Culture  Setup Time     Final   Value: 03/24/2014 09:46     Performed at [facility]   Culture     Final   Value:        BLOOD CULTURE RECEIVED NO GROWTH TO DATE CULTURE WILL BE HELD FOR 5 DAYS BEFORE ISSUING A FINAL NEGATIVE REPORT     Performed at [facility]   Report Status PENDING   Incomplete  CULTURE, BLOOD (ROUTINE X 2)     Status: None   Collection Time    03/24/14  1:40 AM      Result Value Ref Range Status   Specimen Description BLOOD RIGHT HAND   Final   Special Requests BOTTLES DRAWN AEROBIC ONLY 10CC   Final   Culture  Setup Time     Final   Value: 03/24/2014 09:46     Performed at [facility]   Culture     Final   Value:        BLOOD CULTURE RECEIVED NO GROWTH TO DATE CULTURE WILL BE HELD FOR 5 DAYS BEFORE ISSUING A FINAL NEGATIVE REPORT     Performed at [facility]   Report Status PENDING   Incomplete  MRSA PCR SCREENING     Status: None   Collection Time    03/24/14  5:48 AM      Result Value Ref Range Status   MRSA by PCR NEGATIVE  NEGATIVE Final   Comment:             The GeneXpert MRSA Assay (FDA     approved for NASAL specimens     only), is one component of a     comprehensive MRSA colonization     surveillance program. It is not     intended to diagnose MRSA     infection nor to guide  or     monitor treatment for     MRSA infections.     Studies:  Recent x-ray studies have been reviewed in detail by the Attending Physician  Scheduled Meds:  Scheduled Meds: . atenolol  50 mg Oral Daily  . azithromycin  250 mg Oral Daily  . budesonide (PULMICORT) nebulizer solution  0.5 mg Nebulization BID  . cefTRIAXone (ROCEPHIN)  IV  1 g Intravenous Q24H  . dextromethorphan-guaiFENesin  1 tablet Oral BID  . diltiazem  30 mg Oral 4 times per day  . levalbuterol  0.63 mg Nebulization Q6H  . losartan  25 mg Oral Daily  . potassium chloride SA  20 mEq Oral Daily  . rivaroxaban  20 mg Oral Daily    Time spent on care of this patient: 35 mins  Samella Parr , ANP  [facility] Office  [phone] Pager - Text Page per [name] as per below:  On-Call/Text Page:      [name]      password [email or web address]  If 7PM-7AM, please contact night-coverage [email or web address] Password [email or web address] 03/26/2014, 12:07 PM   LOS: 2 days   Examed patient with ANP Ebony Hail discussed the assessment and plan and agree with the above plan. Discuss plan with patient and all questions were answered. The patient has multiple organ systems failure and requires high complexity decision making for assessment and support, frequent evaluation and titration of therapies, application of advanced monitoring technologies and extensive interpretation of multiple databases. Time devoted to patient care services described in this note is 35 minutes.

## 2014-03-26 NOTE — Progress Notes (Signed)
CARE MANAGEMENT NOTE 03/26/2014  Patient:  Ronald [name], Ronald [name]   Account Number:  [id number]  Date Initiated:  03/26/2014  Documentation initiated by:  [facility]  Subjective/Objective Assessment:   CHF, afib     Action/Plan:   [place] RN   Anticipated DC Date:  03/28/2014   Anticipated DC Plan:  [place]  CM consult      [facility] Choice  HOME HEALTH   Choice offered to / List presented to:  C-1 Patient        [place] arranged  HH-1 RN      [place].   Status of service:  Completed, signed off Medicare Important Message given?  YES (If response is "NO", the following Medicare IM given date fields will be blank) Date Medicare IM given:   Date Additional Medicare IM given:  03/26/2014  Discharge Disposition:  [place]  Per UR Regulation:    If discussed at Long Length of Stay Meetings, dates discussed:    Comments:  03/26/2014 1620 NCM spoke to pt and offered choice for [facility]. Pt requested [facility] for HH. CHF teaching started. Wife has Living Better with Heart Failure booklet. Discussed medication, exercise, diet and daily weights. Explained [place] RN will be arranged for CHF education at home. Pt states he has been taking Xarelto and med runs $23 at drug store. No problems with getting medications. Notified [facility] for [facility] RN. Will need HH RN and completed F2F at dc. Additional Medicare IM given, placed on chart.  Jonnie Finner RN CCM Case Mgmt phone [phone]

## 2014-03-26 NOTE — Consult Note (Signed)
CARDIOLOGY CONSULT NOTE  Patient ID: Ronald [name] MRN: [id number] DOB/AGE: 05-Dec-1933 80 y.o.  Admit date: 03/24/2014 Referring   Erin Hearing NP Primary Physician:  Stephens Shire, MD Reason for Consultation  CHF  HPI: Ronald [name] is a 80 y.o. AA male with a history of HTN, GERD, IBS, who presented  to the ED with complaints of worsening SOB and Lower Leg Swelling x3 months but worst for the past 10 days to 2 weeks. However over the past 3 days prior to admission to the hospital 4 days ago, he said that his symptoms got very worse. Also had fever, and chills, although he felt weak and tired thought that it may go away but eventually presented to the emergency room.  He denied any chest pain, does have palpitations. He did have orthopnea. He was evaluated in the ED and was found to have a temperature to 101.6 and a chest x-ray tht revealed a mixed picture of cardiomegaly with CHF findings he was admitted by the hospitalist group with antibiotic therapy and IV Cardizem. Due to abnormal echocardiogram I was consulted to evaluate him for congestive heart failure.  Patient states that he feels remarkably improved, has started to bring up phlegm today, states that his temperature has also settled down and his appetite is improved. He has noticed that his leg edema is also much improved. He was able to sleep better yesterday. Denies any palpitations presently. No chest pain.     Past Medical History  Diagnosis Date  . Irritable bowel syndrome   . Lactose intolerance   . Other and unspecified hyperlipidemia   . Reflux esophagitis   . Essential hypertension, benign   . Allergic rhinitis, cause unspecified   . Vitamin B12 deficiency   . BPH (benign prostatic hypertrophy)   . Personal history of colonic polyps 03/04/1992    adenomatous polyp  . Hiatal hernia 2014  . Schatzki's ring 2014  . Pneumonia   . Hyperlipidemia   . GERD (gastroesophageal reflux disease)   . Sleep apnea   .  Glaucoma      Past Surgical History  Procedure Laterality Date  . Prostate surgery    . Nose surgery    . Eye surgery Left   . Cataract extraction Left      No family history on file.   Social History: History   Social History  . Marital Status: Married    Spouse Name: N/A    Number of Children: N/A  . Years of Education: N/A   Occupational History  . Not on file.   Social History Main Topics  . Smoking status: Former Smoker -- 1.00 packs/day    Types: Cigarettes    Quit date: 11/02/1983  . Smokeless tobacco: Not on file  . Alcohol Use: Yes     Comment: 2oz wine occassionally per day  . Drug Use: No  . Sexual Activity: Not on file   Other Topics Concern  . Not on file   Social History Narrative  . No narrative on file     Facility-administered medications prior to admission  Medication Dose Route Frequency Provider Last Rate Last Dose  . cyanocobalamin ((VITAMIN B-12)) injection 1,000 mcg  1,000 mcg Intramuscular Q30 days Sable Feil, MD   1,000 mcg at 12/04/12 7616   Prescriptions prior to admission  Medication Sig Dispense Refill  . amLODipine (NORVASC) 5 MG tablet Take 2.5 mg by mouth daily.       [name] atenolol (  TENORMIN) 50 MG tablet Take 50 mg by mouth daily.      . clidinium-chlordiazePOXIDE (LIBRAX) 5-2.5 MG per capsule Take 1 capsule by mouth 2 (two) times daily as needed (ibs).      . furosemide (LASIX) 40 MG tablet Take 40 mg by mouth daily.      [name] losartan (COZAAR) 100 MG tablet Take 50 mg by mouth daily.      . potassium chloride SA (K-DUR,KLOR-CON) 20 MEQ tablet Take 20 mEq by mouth daily.      . Rivaroxaban (XARELTO) 15 MG TABS tablet Take 15 mg by mouth daily.        Scheduled Meds: . azithromycin  250 mg Oral Daily  . budesonide (PULMICORT) nebulizer solution  0.5 mg Nebulization BID  . cefTRIAXone (ROCEPHIN)  IV  1 g Intravenous Q24H  . dextromethorphan-guaiFENesin  1 tablet Oral BID  . isosorbide-hydrALAZINE  1 tablet Oral TID  .  levalbuterol  0.63 mg Nebulization Q6H  . losartan  25 mg Oral Daily  . metoprolol tartrate  50 mg Oral BID  . potassium chloride SA  20 mEq Oral Daily  . rivaroxaban  20 mg Oral Daily   Continuous Infusions:  PRN Meds:.diltiazem, hydrALAZINE, HYDROmorphone (DILAUDID) injection, levalbuterol  ROS: General: Had fevers/chills/night sweats, none since this morning Eyes: no blurry vision, diplopia, or amaurosis ENT: no sore throat or hearing loss Resp:  Cough present was dry until today, Had wheezing, no  hemoptysis GI: no abdominal pain, nausea, vomiting, diarrhea, or constipation GU: no dysuria, frequency, or hematuria Skin: no rash Neuro: no headache, numbness, tingling, or weakness of extremities Musculoskeletal: no joint pain Heme: no bleeding, DVT, or easy bruising Endo: no polydipsia or polyuria   Physical Exam: Blood pressure 117/67, pulse 58, temperature 97.7 F (36.5 C), temperature source Oral, resp. rate 21, height 6' (1.829 m), weight 103.375 kg (227 lb 14.4 oz), SpO2 95.00%.   General appearance: alert, cooperative, appears stated age, no distress and mildly obese Neck: JVD - 8 cm above sternal notch, no carotid bruit and supple, symmetrical, trachea midline Lungs: rhonchi anterior - bilateral, bilaterally and posterior - bilateral and wheezes bilaterally Chest wall: no tenderness Heart: S1 is variable, S2 is normal. No gallop appreciated. Distant heart sounds. No murmur appreciated. Abdomen: Soft, nontender. Bowel sounds heard in all quadrants. Mild hepatomegaly with hepatojugular reflux present. Extremities: edema 2+ bilateral below-knee and pitting type. and Homans sign is negative, no sign of DVT Pulses: 2+ and symmetric Neurologic: Grossly normal  Labs:   Lab Results  Component Value Date   WBC 4.9 03/26/2014   HGB 14.2 03/26/2014   HCT 44.8 03/26/2014   MCV 92.0 03/26/2014   PLT 114* 03/26/2014    Recent Labs Lab 03/25/14 0235 03/26/14 0329  NA 139 143  K  3.7 3.9  CL 97 101  CO2 32 33*  BUN 15 22  CREATININE 1.25 1.22  CALCIUM 8.1* 8.6  PROT 6.0  --   BILITOT 1.1  --   ALKPHOS 56  --   ALT 13  --   AST 23  --   GLUCOSE 145* 136*   Lab Results  Component Value Date   TROPONINI <0.30 03/24/2014    Lipid Panel     Component Value Date/Time   CHOL 86 03/25/2014 0235   TRIG 60 03/25/2014 0235   HDL 35* 03/25/2014 0235   CHOLHDL 2.5 03/25/2014 0235   VLDL 12 03/25/2014 0235   LDLCALC 39 03/25/2014 0235  EKG: Atrial fibrillation with controlled ventricular response, normal axis, PVC, nonspecific T abnormalities..  Echocardiogram 03/24/2014:  Left ventricle: The cavity size was normal. Wall thickness was ormal. Systolic function was severely reduced. The estimated ejection fraction was in the range of 25% to 30%. Diffuse hypokinesis. -Mitral valve: There was mild regurgitation. Left atrium: The atrium was moderately dilated. - Right ventricle: The cavity size was moderately dilated. Right atrium: The atrium was severely dilated. - Tricuspid valve: There was moderate-severe regurgitation. Pulmonary arteries: PA peak pressure: 33 mm Hg (S), consistent with mild pulmonary hypertension. Compared to the echocardiogram done on 03/15/2014, done in the outpatient basis in our office, no significant change.  Radiology: Dg Chest Port 1 View  03/24/2014   CLINICAL DATA:  Dyspnea with wheezing.  EXAM: PORTABLE CHEST - 1 VIEW  COMPARISON:  Portable radiograph earlier in the day.  FINDINGS: Cardiomegaly persists. Left lower lobe airspace opacity is re-demonstrated, likely representing pneumonia. Platelike atelectasis right base. Stable cardiomediastinal silhouette. No osseous lesions.  IMPRESSION: Left lower lobe pneumonia suspected. Similar appearance to priors. Cardiomegaly.   Electronically Signed   By: Rolla Flatten M.D.   On: 03/24/2014 21:33   Scheduled Meds: . azithromycin  250 mg Oral Daily  . budesonide (PULMICORT) nebulizer solution  0.5 mg  Nebulization BID  . cefTRIAXone (ROCEPHIN)  IV  1 g Intravenous Q24H  . dextromethorphan-guaiFENesin  1 tablet Oral BID  . isosorbide-hydrALAZINE  1 tablet Oral TID  . [START ON 03/27/2014] levalbuterol  0.63 mg Nebulization TID  . losartan  25 mg Oral Daily  . metoprolol tartrate  50 mg Oral BID  . potassium chloride SA  20 mEq Oral Daily  . rivaroxaban  20 mg Oral Daily   Continuous Infusions:  PRN Meds:.diltiazem, hydrALAZINE, HYDROmorphone (DILAUDID) injection, levalbuterol  ASSESSMENT AND PLAN:  1. Acute on chronic systolic and diastolic heart failure with severe left systolic dysfunction, evidence of right ventricular failure also present. 2. Atrial for patient controlled ventricular response, patient presented with pneumonia and atrial fibrillation with rapid ventricular response. 3. Chronic kidney disease stage I, serum creatinine stable with EGFR 62 mL. 4. Chronic thrombocytopenia, etiology not known.  Recommendation: Patient is on appropriate rate control strategy although I would like to avoid using Cardizem in a patient with severe LV systolic dysfunction. I discontinued this, discontinued atenolol and switch to more to metoprolol 50 mg by mouth twice a day. I will also start the patient on Bidil one by mouth 3 times a day. At Lasix 20 mg by mouth q. a.m.  Laverda Page, MD 03/26/2014, 8:03 PM [facility] [place] Pager: [phone] Office: [phone] If no answer Cell [phone]

## 2014-03-27 LAB — BASIC METABOLIC PANEL
BUN: 28 mg/dL — AB (ref 6–23)
CALCIUM: 8.7 mg/dL (ref 8.4–10.5)
CO2: 35 mEq/L — ABNORMAL HIGH (ref 19–32)
Chloride: 101 mEq/L (ref 96–112)
Creatinine, Ser: 1.31 mg/dL (ref 0.50–1.35)
GFR calc Af Amer: 58 mL/min — ABNORMAL LOW (ref >90)
GFR, EST NON AFRICAN AMERICAN: 50 mL/min — AB (ref >90)
Glucose, Bld: 101 mg/dL — ABNORMAL HIGH (ref 70–99)
Potassium: 4 mEq/L (ref 3.7–5.3)
SODIUM: 144 meq/L (ref 137–147)

## 2014-03-27 MED ORDER — DIGOXIN 125 MCG PO TABS
0.1250 mg | ORAL_TABLET | ORAL | Status: AC
  Administered 2014-03-27 (×3): 0.125 mg via ORAL
  Filled 2014-03-27 (×3): qty 1

## 2014-03-27 MED ORDER — CILIDINIUM-CHLORDIAZEPOXIDE 2.5-5 MG PO CAPS
1.0000 | ORAL_CAPSULE | Freq: Two times a day (BID) | ORAL | Status: DC | PRN
  Filled 2014-03-27: qty 1

## 2014-03-27 MED ORDER — METOPROLOL TARTRATE 25 MG PO TABS
25.0000 mg | ORAL_TABLET | Freq: Once | ORAL | Status: AC
  Administered 2014-03-27: 25 mg via ORAL
  Filled 2014-03-27: qty 1

## 2014-03-27 MED ORDER — METOPROLOL TARTRATE 50 MG PO TABS
50.0000 mg | ORAL_TABLET | Freq: Two times a day (BID) | ORAL | Status: DC
  Administered 2014-03-27: 50 mg via ORAL
  Filled 2014-03-27 (×3): qty 1

## 2014-03-27 MED ORDER — DIGOXIN 125 MCG PO TABS
0.1250 mg | ORAL_TABLET | Freq: Every day | ORAL | Status: DC
  Administered 2014-03-28 – 2014-03-29 (×2): 0.125 mg via ORAL
  Filled 2014-03-27 (×2): qty 1

## 2014-03-27 NOTE — Progress Notes (Signed)
[facility] TEAM 1 - Stepdown/ICU TEAM Progress Note  METRO EDENFIELD BMW:[id number] DOB: 1934-08-15 DOA: 03/24/2014 PCP: Stephens Shire, MD  Admit HPI / Brief Narrative: 80 y.o. male with a history of HTN, GERD, and IBS, who presented to the ED with complaints of worsening SOB and Leg Swelling x3 months but worst for the prededing 3 days. He reported having DOE, and Orthopnea. He denied chest pain but reported palpitations. He also reported having fevers and chills. He was evaluated in the ED and was found to have a temperature to 101.6 and a chest x-ray revealed a mixed picture of cardiomegaly with CHF findings and a left mid-lung Infiltrate. He was also found to be in Atrial fibrillation with RVR. He was placed on IV Antibiotic to cover CAP, and was also placed on an IV Cardizem drip due to the A.Fib with RVR, and administered IV Lasix X 1 and then referred for medical admission.   HPI/Subjective: Endorses feeling better every day- still with productive cough.  Denies chest pain nausea vomiting or abdominal pain.  Assessment/Plan:  Afib w/ RVR/transient bradycardia Rate now well controlled - suspect afib is chronic as pt was on Xarelto at admit - sees Dr. Woody Seller as outpt - Dr. Einar Gip has adjusted meds and rec no CCB with underlying severe SHF and has dc'd atenolol in favor of metoprolol - CCB still on MAR and likely that in association with other rate controlling meds (including digoxin) contributed to recent transient bradycardia - CCB dc'd  Acute hypoxic respiratory failure: A) LLL CAP afebrile last 24hrs - continues to require supplemental O2 - cont empiric abx tx B) Pulmonary edema /Severe bi-ventricular heart failure/EF 25% TTE with diffuse hypokinesis and EF 25% with RA/RV dilatation - cont to diurese and follow Is/Os - consulted Cards C) Mild pulmonary HTN / moderate to severe TR See systolic CHF  Thrombocytopenia Platelets were normal Oct 2014 and low at presentation - etiology could  be 2/2 infection - was on Xarelto and ? Lasix pre admit - follow- no s/s bleeding  HTN BP well controlled/borderline low - follow w/ gently diuresis   IBS Quiescent   Reported hx of Hyperlipidemia  Lipid panel actually quite favorable - ?accuracy of this diagnosis   Vitamin B12 deficiency   Hyperglycemia No reported hx of DM - A1c 5.8  Code Status: FULL Family Communication: spoke w/ pt and wife at bedside  Disposition Plan: Transfer to Telemetry  Consultants: Dr. Einar Gip - Cardiology  Procedures: TTE - Left ventricle: The cavity size was normal. Wall thickness was normal. Systolic function was severely reduced. The estimated ejection fraction was in the range of 25% to 30%. Diffuse hypokinesis. - Mitral valve: There was mild regurgitation. - Left atrium: The atrium was moderately dilated. - Right ventricle: The cavity size was moderately dilated. - Right atrium: The atrium was severely dilated. - Atrial septum: No defect or patent foramen ovale was identified. - Tricuspid valve: There was moderate-severe regurgitation. - Pulmonary arteries: PA peak pressure: 33 mm Hg (S).   Antibiotics: Azithromycin 5/24>>  Ceftriaxone 5/24>>  DVT prophylaxis: xarelto  Objective: Blood pressure 115/66, pulse 76, temperature 98.6 F (37 C), temperature source Oral, resp. rate 18, height 6' (1.829 m), weight 229 lb 1.6 oz (103.919 kg), SpO2 95.00%.  Intake/Output Summary (Last 24 hours) at 03/27/14 1234 Last data filed at 03/27/14 0800  Gross per 24 hour  Intake    640 ml  Output   1350 ml  Net   -710  ml   Exam: General: No acute respiratory distress in bed  Lungs: bibasilar fine crackles - no wheezing , 4-5 L - no observed orthopnea Cardiovascular: irreg irreg - rate controlled at 80 - no appreciable gallup or rub or M Abdomen: Nontender, nondistended, soft, bowel sounds positive, no rebound, no ascites, no appreciable mass Extremities: No significant cyanosis, or clubbing;  trace edema bilateral lower extremities  Data Reviewed: Basic Metabolic Panel:  Recent Labs Lab 03/24/14 03/25/14 0235 03/26/14 0329 03/27/14 0248  NA 141 139 143 144  K 3.8 3.7 3.9 4.0  CL 104 97 101 101  CO2 25 32 33* 35*  GLUCOSE 116* 145* 136* 101*  BUN 13 15 22  28*  CREATININE 1.47* 1.25 1.22 1.31  CALCIUM 9.0 8.1* 8.6 8.7  MG  --  1.8  --   --    Liver Function Tests:  Recent Labs Lab 03/24/14 0240 03/25/14 0235  AST 24 23  ALT 13 13  ALKPHOS 55 56  BILITOT 1.8* 1.1  PROT 6.0 6.0  ALBUMIN 3.2* 3.1*   CBC:  Recent Labs Lab 03/24/14 03/25/14 0235 03/26/14 0329  WBC 5.4 3.8* 4.9  NEUTROABS  --  3.4  --   HGB 15.2 14.4 14.2  HCT 46.6 45.1 44.8  MCV 91.7 93.0 92.0  PLT 110* 101* 114*   Cardiac Enzymes:  Recent Labs Lab 03/24/14  TROPONINI <0.30   BNP (last 3 results)  Recent Labs  03/24/14  PROBNP 6491.0*    Recent Results (from the past 240 hour(s))  CULTURE, BLOOD (ROUTINE X 2)     Status: None   Collection Time    03/24/14  1:30 AM      Result Value Ref Range Status   Specimen Description BLOOD LEFT HAND   Final   Special Requests BOTTLES DRAWN AEROBIC AND ANAEROBIC 10CC EA   Final   Culture  Setup Time     Final   Value: 03/24/2014 09:46     Performed at [facility]   Culture     Final   Value:        BLOOD CULTURE RECEIVED NO GROWTH TO DATE CULTURE WILL BE HELD FOR 5 DAYS BEFORE ISSUING A FINAL NEGATIVE REPORT     Performed at [facility]   Report Status PENDING   Incomplete  CULTURE, BLOOD (ROUTINE X 2)     Status: None   Collection Time    03/24/14  1:40 AM      Result Value Ref Range Status   Specimen Description BLOOD RIGHT HAND   Final   Special Requests BOTTLES DRAWN AEROBIC ONLY 10CC   Final   Culture  Setup Time     Final   Value: 03/24/2014 09:46     Performed at [facility]   Culture     Final   Value:        BLOOD CULTURE RECEIVED NO GROWTH TO DATE CULTURE WILL BE HELD FOR 5 DAYS BEFORE  ISSUING A FINAL NEGATIVE REPORT     Performed at [facility]   Report Status PENDING   Incomplete  MRSA PCR SCREENING     Status: None   Collection Time    03/24/14  5:48 AM      Result Value Ref Range Status   MRSA by PCR NEGATIVE  NEGATIVE Final   Comment:            The GeneXpert MRSA Assay (FDA  approved for NASAL specimens     only), is one component of a     comprehensive MRSA colonization     surveillance program. It is not     intended to diagnose MRSA     infection nor to guide or     monitor treatment for     MRSA infections.     Studies:  Recent x-ray studies have been reviewed in detail by the Attending Physician  Scheduled Meds:  Scheduled Meds: . azithromycin  250 mg Oral Daily  . budesonide (PULMICORT) nebulizer solution  0.5 mg Nebulization BID  . cefTRIAXone (ROCEPHIN)  IV  1 g Intravenous Q24H  . dextromethorphan-guaiFENesin  1 tablet Oral BID  . digoxin  0.125 mg Oral Q4H  . [START ON 03/28/2014] digoxin  0.125 mg Oral Daily  . furosemide  20 mg Oral q morning - 10a  . isosorbide-hydrALAZINE  1 tablet Oral TID  . levalbuterol  0.63 mg Nebulization TID  . losartan  25 mg Oral Daily  . metoprolol tartrate  25 mg Oral BID  . potassium chloride SA  20 mEq Oral Daily  . rivaroxaban  20 mg Oral Daily    Time spent on care of this patient: 35 mins  Samella Parr , ANP  [facility] Office  [phone] Pager - Text Page per [name] as per below:  On-Call/Text Page:      [name]      password [email or web address]  If 7PM-7AM, please contact night-coverage [email or web address] Password [email or web address] 03/27/2014, 12:34 PM   LOS: 3 days    I have personally examined this patient and reviewed the entire database. I have reviewed the above note, made any necessary editorial changes, and agree with its content.  Cherene Altes, MD [facility]

## 2014-03-27 NOTE — Progress Notes (Signed)
Subjective:  Patient feels much better and dyspnea improved. No chest pain or PND or orthopnea. No palpitations  Objective:  Vital Signs in the last 24 hours: Temp:  [97.6 F (36.4 C)-98.8 F (37.1 C)] 98.4 F (36.9 C) (05/27 2018) Pulse Rate:  [35-115] 90 (05/27 2018) Resp:  [0-27] 18 (05/27 2018) BP: (104-136)/(66-78) 136/70 mmHg (05/27 2018) SpO2:  [93 %-96 %] 95 % (05/27 2018) Weight:  [103.919 kg (229 lb 1.6 oz)] 103.919 kg (229 lb 1.6 oz) (05/27 0424)  Intake/Output from previous day: 05/26 0701 - 05/27 0700 In: 890 [P.O.:890] Out: 1350 [Urine:1350]  Physical Exam: General appearance: alert, cooperative, appears stated age, no distress and mildly obese  Neck: JVD -still elevated, no carotid bruit and supple, symmetrical, trachea midline  Lungs: rhonchi anterior - Expiratory wheezes bilaterally  Chest wall: no tenderness  Heart: S1 is variable, S2 is normal. No gallop appreciated. Distant heart sounds. No murmur appreciated.  Abdomen: Soft, nontender. Bowel sounds heard in all quadrants. Mild hepatomegaly with hepatojugular reflux present.  Extremities: edema 2+ bilateral below-knee and pitting type. and Homans sign is negative, no sign of DVT  Pulses: 2+ and symmetric  Neurologic: Grossly normal    Lab Results: BMP  Recent Labs  03/25/14 0235 03/26/14 0329 03/27/14 0248  NA 139 143 144  K 3.7 3.9 4.0  CL 97 101 101  CO2 32 33* 35*  GLUCOSE 145* 136* 101*  BUN 15 22 28*  CREATININE 1.25 1.22 1.31  CALCIUM 8.1* 8.6 8.7  GFRNONAA 53* 55* 50*  GFRAA 62* 64* 58*    CBC  Recent Labs Lab 03/25/14 0235 03/26/14 0329  WBC 3.8* 4.9  RBC 4.85 4.87  HGB 14.4 14.2  HCT 45.1 44.8  PLT 101* 114*  MCV 93.0 92.0  MCH 29.7 29.2  MCHC 31.9 31.7  RDW 13.2 12.5  LYMPHSABS 0.3*  --   MONOABS 0.1  --   EOSABS 0.0  --   BASOSABS 0.0  --     HEMOGLOBIN A1C Lab Results  Component Value Date   HGBA1C 5.8* 03/26/2014   MPG 120* 03/26/2014    Cardiac Panel (last  3 results)  Recent Labs  03/24/14  TROPONINI <0.30    BNP (last 3 results)  Recent Labs  03/24/14  PROBNP 6491.0*    TSH  Recent Labs  08/10/13 1058  TSH 0.84    CHOLESTEROL  Recent Labs  03/25/14 0235  CHOL 86    Hepatic Function Panel  Recent Labs  08/10/13 1058 01/16/14 1612 03/24/14 0240 03/25/14 0235  PROT 6.6 6.8 6.0 6.0  ALBUMIN 3.9 4.1 3.2* 3.1*  AST 19 25 24 23  ALT 14 19 13 13  ALKPHOS 56 60 55 56  BILITOT 1.3* 1.5* 1.8* 1.1  BILIDIR 0.2 0.3 0.5*  --   IBILI  --   --  1.3*  --    G: Atrial fibrillation with controlled ventricular response, normal axis, PVC, nonspecific T abnormalities..  Echocardiogram 03/24/2014: Left ventricle: The cavity size was normal. Wall thickness was ormal. Systolic function was severely reduced. The estimated ejection fraction was in the range of 25% to 30%. Diffuse hypokinesis. -Mitral valve: There was mild regurgitation. Left atrium: The atrium was moderately dilated. - Right ventricle: The cavity size was moderately dilated. Right atrium: The atrium was severely dilated. - Tricuspid valve: There was moderate-severe regurgitation. Pulmonary arteries: PA peak pressure: 33 mm Hg (S), consistent with mild pulmonary hypertension. Compared to the echocardiogram done on 03/15/2014,   done in the outpatient basis in our office, no significant change.   Assessment/Plan:  1. Acute on chronic systolic and diastolic heart failure with severe left systolic dysfunction, evidence of right ventricular failure also present.  2. Atrial fibrillation with controlled ventricular response, (occasional rvr) patient presented with pneumonia and atrial fibrillation with rapid ventricular response.  3. Chronic kidney disease stage I, serum creatinine stable with EGFR 62 mL.  4. Chronic thrombocytopenia, etiology not known.  Rec: Patient presently doing well. Needs bronchodilator therapy. Add digoxin for rate control of a. Fib.     Laverda Page, M.D. 03/27/2014, 9:43 PM [facility] Pager: [phone] Office: [phone] If no answer: [phone]

## 2014-03-27 NOTE — Progress Notes (Signed)
03/27/2014 1520 Nursing note RN noted since pt. transfer to 2w02 that heart rate was elevated to 100-115 sustaining Afib and occasional bursts of Afib 120-140 non-sustaining. Pt. Asymptomatic. VSS. Dr. Billee Cashing Clung paged and made aware of findings. Verbal orders received to give 25 mg Metoprolol Po x1 and MD to make med adjustments accordingly for rate control. Orders enacted. Pt. Updated on plan of care. Will continue to monitor patient.  [place]

## 2014-03-27 NOTE — Progress Notes (Signed)
Pts. HR has been 50's-60's and SBP in low 100's.  Dr. Einar Gip was paged and made aware with regards to heart medications and parameters was given.

## 2014-03-27 NOTE — Progress Notes (Signed)
Report called, pt transferring to 2W02 via w/c with family and belongings.

## 2014-03-28 DIAGNOSIS — J810 Acute pulmonary edema: Secondary | ICD-10-CM

## 2014-03-28 DIAGNOSIS — I5021 Acute systolic (congestive) heart failure: Secondary | ICD-10-CM

## 2014-03-28 DIAGNOSIS — I2789 Other specified pulmonary heart diseases: Secondary | ICD-10-CM

## 2014-03-28 DIAGNOSIS — I079 Rheumatic tricuspid valve disease, unspecified: Secondary | ICD-10-CM

## 2014-03-28 LAB — BASIC METABOLIC PANEL
BUN: 23 mg/dL (ref 6–23)
CALCIUM: 8.4 mg/dL (ref 8.4–10.5)
CO2: 33 mEq/L — ABNORMAL HIGH (ref 19–32)
CREATININE: 1.02 mg/dL (ref 0.50–1.35)
Chloride: 100 mEq/L (ref 96–112)
GFR, EST AFRICAN AMERICAN: 79 mL/min — AB (ref >90)
GFR, EST NON AFRICAN AMERICAN: 68 mL/min — AB (ref >90)
GLUCOSE: 88 mg/dL (ref 70–99)
POTASSIUM: 3.9 meq/L (ref 3.7–5.3)
Sodium: 140 mEq/L (ref 137–147)

## 2014-03-28 MED ORDER — METOPROLOL TARTRATE 50 MG PO TABS
50.0000 mg | ORAL_TABLET | Freq: Three times a day (TID) | ORAL | Status: DC
  Administered 2014-03-28 – 2014-03-29 (×4): 50 mg via ORAL
  Filled 2014-03-28 (×6): qty 1

## 2014-03-28 MED ORDER — LEVALBUTEROL HCL 0.63 MG/3ML IN NEBU
0.6300 mg | INHALATION_SOLUTION | Freq: Two times a day (BID) | RESPIRATORY_TRACT | Status: DC
  Administered 2014-03-28 – 2014-03-29 (×2): 0.63 mg via RESPIRATORY_TRACT
  Filled 2014-03-28 (×3): qty 3

## 2014-03-28 NOTE — Progress Notes (Signed)
[place] TEAM 1 - Stepdown/ICU TEAM Progress Note  Ronald [name] DDU:[id number] DOB: 08-11-1934 DOA: 03/24/2014 PCP: Stephens Shire, MD  Admit HPI / Brief Narrative: 80 y.o. male with a history of HTN, GERD, and IBS, who presented to the ED with complaints of worsening SOB and Leg Swelling x3 months but worst for the prededing 3 days. He reported having DOE, and Orthopnea. He denied chest pain but reported palpitations. He also reported having fevers and chills. He was evaluated in the ED and was found to have a temperature to 101.6 and a chest x-ray revealed a mixed picture of cardiomegaly with CHF findings and a left mid-lung Infiltrate. He was also found to be in Atrial fibrillation with RVR. He was placed on IV Antibiotic to cover CAP, and was also placed on an IV Cardizem drip due to the A.Fib with RVR, and administered IV Lasix X 1 and then referred for medical admission.   HPI/Subjective: Feels tired but no SOB or orthopnea  Assessment/Plan:  Afib w/ RVR/transient bradycardia Rate now well controlled - suspect afib is chronic as pt was on Xarelto at admit - sees Dr. Woody Seller as outpt - Dr. Einar Gip rec no CCB with underlying severe SHF and has dc'd atenolol in favor of metoprolol-has increased BB dose today  Acute hypoxic respiratory failure: A) LLL CAP afebrile last 24hrs - continues to require supplemental O2 - cont empiric abx tx B) Pulmonary edema /Severe bi-ventricular heart failure/EF 25% TTE with diffuse hypokinesis and EF 25% with RA/RV dilatation - cont to diurese and follow Is/ - consulted Cards-now off oxygen but still has significant LE edema so suspect may need to increase Lasix dose and consider returning to IV route- thus far has diuresed 6 liters-ck ambulatory sats C) Mild pulmonary HTN / moderate to severe TR See systolic CHF  Thrombocytopenia Platelets were normal Oct 2014 and low at presentation - etiology could be 2/2 infection - was on Xarelto and ? Lasix pre admit  - follow- no s/s bleeding  HTN BP well controlled/borderline low - follow w/ gently diuresis   IBS Quiescent   Reported hx of Hyperlipidemia  Lipid panel actually quite favorable - ?accuracy of this diagnosis   Vitamin B12 deficiency   Hyperglycemia No reported hx of DM - A1c 5.8  Code Status: FULL Family Communication: spoke w/ pt and wife at bedside  Disposition Plan: Telemetry-possible dc in next 48 hrs if can improve diuresis- per family not very compliant at home so need to get close to dry wt BEFORE dc  Consultants: Dr. Einar Gip - Cardiology  Procedures: 5/24 TTE - Left ventricle: Sstolic function severely reduced.  -LVEF=25% to 30%. Diffuse hypokinesis. - Mitral valve:  mild regurgitation. - Left atrium: moderately dilated. - Right ventricle: moderately dilated. - Right atrium:  severely dilated. - Atrial septum: No defect or patent foramen ovale was identified. - Tricuspid valve: moderate-severe regurgitation. - Pulmonary arteries: PA peak pressure: 33 mm Hg (S).   Antibiotics: Azithromycin 5/24>>  Ceftriaxone 5/24>>  DVT prophylaxis: xarelto  Objective: Blood pressure 117/85, pulse 120, temperature 99 F (37.2 C), temperature source Oral, resp. rate 18, height 6' (1.829 m), weight 229 lb 4.8 oz (104.01 kg), SpO2 95.00%.  Intake/Output Summary (Last 24 hours) at 03/28/14 1305 Last data filed at 03/28/14 1037  Gross per 24 hour  Intake    360 ml  Output   1700 ml  Net  -1340 ml   Exam: General: No acute respiratory distress in bed  Lungs: bibasilar fine crackles - no wheezing , RA - no observed orthopnea Cardiovascular: irreg irreg - rate controlled at 80 - no appreciable gallup or rub or M Abdomen: Nontender, nondistended, soft, bowel sounds positive, no rebound, no ascites, no appreciable mass Extremities: No significant cyanosis, or clubbing; 1-2+ edema bilateral lower extremities  Data Reviewed: Basic Metabolic Panel:  Recent Labs Lab 03/24/14  03/25/14 0235 03/26/14 0329 03/27/14 0248 03/28/14 0252  NA 141 139 143 144 140  K 3.8 3.7 3.9 4.0 3.9  CL 104 97 101 101 100  CO2 25 32 33* 35* 33*  GLUCOSE 116* 145* 136* 101* 88  BUN 13 15 22  28* 23  CREATININE 1.47* 1.25 1.22 1.31 1.02  CALCIUM 9.0 8.1* 8.6 8.7 8.4  MG  --  1.8  --   --   --    Liver Function Tests:  Recent Labs Lab 03/24/14 0240 03/25/14 0235  AST 24 23  ALT 13 13  ALKPHOS 55 56  BILITOT 1.8* 1.1  PROT 6.0 6.0  ALBUMIN 3.2* 3.1*   CBC:  Recent Labs Lab 03/24/14 03/25/14 0235 03/26/14 0329  WBC 5.4 3.8* 4.9  NEUTROABS  --  3.4  --   HGB 15.2 14.4 14.2  HCT 46.6 45.1 44.8  MCV 91.7 93.0 92.0  PLT 110* 101* 114*   Cardiac Enzymes:  Recent Labs Lab 03/24/14  TROPONINI <0.30   BNP (last 3 results)  Recent Labs  03/24/14  PROBNP 6491.0*    Recent Results (from the past 240 hour(s))  CULTURE, BLOOD (ROUTINE X 2)     Status: None   Collection Time    03/24/14  1:30 AM      Result Value Ref Range Status   Specimen Description BLOOD LEFT HAND   Final   Special Requests BOTTLES DRAWN AEROBIC AND ANAEROBIC 10CC EA   Final   Culture  Setup Time     Final   Value: 03/24/2014 09:46     Performed at [facility]   Culture     Final   Value:        BLOOD CULTURE RECEIVED NO GROWTH TO DATE CULTURE WILL BE HELD FOR 5 DAYS BEFORE ISSUING A FINAL NEGATIVE REPORT     Performed at [facility]   Report Status PENDING   Incomplete  CULTURE, BLOOD (ROUTINE X 2)     Status: None   Collection Time    03/24/14  1:40 AM      Result Value Ref Range Status   Specimen Description BLOOD RIGHT HAND   Final   Special Requests BOTTLES DRAWN AEROBIC ONLY 10CC   Final   Culture  Setup Time     Final   Value: 03/24/2014 09:46     Performed at [facility]   Culture     Final   Value:        BLOOD CULTURE RECEIVED NO GROWTH TO DATE CULTURE WILL BE HELD FOR 5 DAYS BEFORE ISSUING A FINAL NEGATIVE REPORT     Performed at [facility]   Report Status PENDING   Incomplete  MRSA PCR SCREENING     Status: None   Collection Time    03/24/14  5:48 AM      Result Value Ref Range Status   MRSA by PCR NEGATIVE  NEGATIVE Final   Comment:            The GeneXpert MRSA Assay (FDA  approved for NASAL specimens     only), is one component of a     comprehensive MRSA colonization     surveillance program. It is not     intended to diagnose MRSA     infection nor to guide or     monitor treatment for     MRSA infections.     Studies:  Recent x-ray studies have been reviewed in detail by the Attending Physician  Scheduled Meds:  Scheduled Meds: . azithromycin  250 mg Oral Daily  . budesonide (PULMICORT) nebulizer solution  0.5 mg Nebulization BID  . cefTRIAXone (ROCEPHIN)  IV  1 g Intravenous Q24H  . dextromethorphan-guaiFENesin  1 tablet Oral BID  . digoxin  0.125 mg Oral Daily  . furosemide  20 mg Oral q morning - 10a  . isosorbide-hydrALAZINE  1 tablet Oral TID  . levalbuterol  0.63 mg Nebulization BID  . losartan  25 mg Oral Daily  . metoprolol tartrate  50 mg Oral TID  . potassium chloride SA  20 mEq Oral Daily  . rivaroxaban  20 mg Oral Daily    Time spent on care of this patient: 35 mins  Samella Parr , ANP  [facility] Office  [phone] Pager - Text Page per [name] as per below:  On-Call/Text Page:      [name]      password [email or web address]  If 7PM-7AM, please contact night-coverage [email or web address] Password [email or web address] 03/28/2014, 1:05 PM   LOS: 4 days     Examined patient and reviewed assessment and plan with ANP Ebony Hail, and agree with the above plan.  The patient has multiple organ systems involvement and requires high complexity decision making for assessment and support, frequent evaluation and titration of therapies, application of advanced monitoring technologies and extensive interpretation of multiple databases.  Time devoted to patient care services described in this note is 35  minutes.

## 2014-03-28 NOTE — Progress Notes (Signed)
Subjective:  Patient feels much better and dyspnea improved. No chest pain or PND or orthopnea. No palpitations. Had mild blood tinged sputum yesterday and today. Sitting up and shaving, walked to the bathroom without much difficulty  Objective:  Vital Signs in the last 24 hours: Temp:  [98.4 F (36.9 C)-99 F (37.2 C)] 99 F (37.2 C) (05/28 0441) Pulse Rate:  [85-115] 89 (05/28 0441) Resp:  [18-27] 18 (05/28 0441) BP: (109-136)/(61-83) 123/83 mmHg (05/28 0441) SpO2:  [93 %-97 %] 97 % (05/28 0441) Weight:  [104.01 kg (229 lb 4.8 oz)] 104.01 kg (229 lb 4.8 oz) (05/28 0441)  Intake/Output from previous day: 05/27 0701 - 05/28 0700 In: 86 [P.O.:720; IV Piggyback:50] Out: 1300 [Urine:1300]  Physical Exam: General appearance: alert, cooperative, appears stated age, no distress and mildly obese  Neck: JVD -still elevated, no carotid bruit and supple, symmetrical, trachea midline  Lungs:  Decreased breath sounds left base with coarse crackles. Chest wall: no tenderness  Heart: S1 is variable, S2 is normal. No gallop appreciated. Distant heart sounds. No murmur appreciated.  Abdomen: Soft, nontender. Bowel sounds heard in all quadrants. Mild hepatomegaly with hepatojugular reflux present.  Extremities: edema 2+ bilateral below-knee and pitting type. and Homans sign is negative, no sign of DVT  Pulses: 2+ and symmetric  Neurologic: Grossly normal    Lab Results: BMP  Recent Labs  03/26/14 0329 03/27/14 0248 03/28/14 0252  NA 143 144 140  K 3.9 4.0 3.9  CL 101 101 100  CO2 33* 35* 33*  GLUCOSE 136* 101* 88  BUN 22 28* 23  CREATININE 1.22 1.31 1.02  CALCIUM 8.6 8.7 8.4  GFRNONAA 55* 50* 68*  GFRAA 64* 58* 79*    CBC  Recent Labs Lab 03/25/14 0235 03/26/14 0329  WBC 3.8* 4.9  RBC 4.85 4.87  HGB 14.4 14.2  HCT 45.1 44.8  PLT 101* 114*  MCV 93.0 92.0  MCH 29.7 29.2  MCHC 31.9 31.7  RDW 13.2 12.5  LYMPHSABS 0.3*  --   MONOABS 0.1  --   EOSABS 0.0  --   BASOSABS  0.0  --     HEMOGLOBIN A1C Lab Results  Component Value Date   HGBA1C 5.8* 03/26/2014   MPG 120* 03/26/2014    Cardiac Panel (last 3 results)  Recent Labs  03/24/14  TROPONINI <0.30    BNP (last 3 results)  Recent Labs  03/24/14  PROBNP 6491.0*    TSH  Recent Labs  08/10/13 1058  TSH 0.84    CHOLESTEROL  Recent Labs  03/25/14 0235  CHOL 86    Hepatic Function Panel  Recent Labs  08/10/13 1058 01/16/14 1612 03/24/14 0240 03/25/14 0235  PROT 6.6 6.8 6.0 6.0  ALBUMIN 3.9 4.1 3.2* 3.1*  AST _0 ALT _1 ALKPHOS 56 60 55 56  BILITOT 1.3* 1.5* 1.8* 1.1  BILIDIR 0.2 0.3 0.5*  --   IBILI  --   --  1.3*  --    G: Atrial fibrillation with controlled ventricular response, normal axis, PVC, nonspecific T abnormalities..  Echocardiogram 03/24/2014: Left ventricle: The cavity size was normal. Wall thickness was ormal. Systolic function was severely reduced. The estimated ejection fraction was in the range of 25% to 30%. Diffuse hypokinesis. -Mitral valve: There was mild regurgitation. Left atrium: The atrium was moderately dilated. - Right ventricle: The cavity size was moderately dilated. Right atrium: The atrium was severely dilated. - Tricuspid valve: There was  moderate-severe regurgitation. Pulmonary arteries: PA peak pressure: 33 mm Hg (S), consistent with mild pulmonary hypertension. Compared to the echocardiogram done on 03/15/2014, done in the outpatient basis in our office, no significant change. Scheduled Meds: . azithromycin  250 mg Oral Daily  . budesonide (PULMICORT) nebulizer solution  0.5 mg Nebulization BID  . cefTRIAXone (ROCEPHIN)  IV  1 g Intravenous Q24H  . dextromethorphan-guaiFENesin  1 tablet Oral BID  . digoxin  0.125 mg Oral Daily  . furosemide  20 mg Oral q morning - 10a  . isosorbide-hydrALAZINE  1 tablet Oral TID  . levalbuterol  0.63 mg Nebulization BID  . losartan  25 mg Oral Daily  . metoprolol tartrate  50 mg Oral  TID  . potassium chloride SA  20 mEq Oral Daily  . rivaroxaban  20 mg Oral Daily   Continuous Infusions:  PRN Meds:.clidinium-chlordiazePOXIDE, hydrALAZINE, levalbuterol   Assessment/Plan:  1. Acute on chronic systolic and diastolic heart failure with severe left systolic dysfunction, evidence of right ventricular failure also present.  Now well compensated to chronic systolic and diastolic heart failure. 2. Atrial fibrillation with controlled ventricular response, (occasional rvr) patient presented with pneumonia and atrial fibrillation with rapid ventricular response.  3. Chronic kidney disease stage I, serum creatinine stable with EGFR 62 mL.  4. Chronic thrombocytopenia, etiology not known.  Rec: Patient presently doing well. Consider repeat CXR, coarse >50% crackles left base. Increased Metoprolol to 50 mg TID. TOlerating Digoxin and Bidil    Laverda Page, M.D. 03/28/2014, 9:05 AM [facility] Pager: [phone] Office: [phone] If no answer: [phone]

## 2014-03-29 ENCOUNTER — Inpatient Hospital Stay (HOSPITAL_COMMUNITY): Payer: Medicare HMO

## 2014-03-29 MED ORDER — FUROSEMIDE 20 MG PO TABS: ORAL_TABLET | ORAL | Status: DC

## 2014-03-29 MED ORDER — LOSARTAN POTASSIUM 25 MG PO TABS: ORAL_TABLET | ORAL | Status: DC

## 2014-03-29 MED ORDER — AZITHROMYCIN 250 MG PO TABS: ORAL_TABLET | ORAL | Status: DC

## 2014-03-29 MED ORDER — ISOSORB DINITRATE-HYDRALAZINE 20-37.5 MG PO TABS: 1.0000 | ORAL_TABLET | Freq: Three times a day (TID) | ORAL | Status: DC

## 2014-03-29 MED ORDER — DIGOXIN 125 MCG PO TABS: 0.1250 mg | ORAL_TABLET | Freq: Every day | ORAL | Status: DC

## 2014-03-29 MED ORDER — METOPROLOL TARTRATE 50 MG PO TABS: 50.0000 mg | ORAL_TABLET | Freq: Three times a day (TID) | ORAL | Status: DC

## 2014-03-29 NOTE — Progress Notes (Addendum)
Subjective:  Patient feels much better and dyspnea improved. No chest pain or PND or orthopnea. No palpitations. walked to the bathroom without much difficulty, PT has signed off for now as patient is doing well.  Complaints that his gout may be coming back in his left foot.  No fever, no nausea or vomiting.  No further hemoptysis.  Objective:  Vital Signs in the last 24 hours: Temp:  [98.1 F (36.7 C)-98.8 F (37.1 C)] 98.8 F (37.1 C) (05/29 5638) Pulse Rate:  [58-88] 88 (05/29 1058) Resp:  [18-19] 18 (05/29 0638) BP: (120-176)/(69-96) 176/86 mmHg (05/29 1058) SpO2:  [94 %-96 %] 96 % (05/29 1058) Weight:  [102.059 kg (225 lb)] 102.059 kg (225 lb) (05/29 7564)  Intake/Output from previous day: 05/28 0701 - 05/29 0700 In: 540 [P.O.:540] Out: 2300 [Urine:2300]  Physical Exam: General appearance: alert, cooperative, appears stated age, no distress and mildly obese  Neck: JVD -still elevated, no carotid bruit and supple, symmetrical, trachea midline  Lungs:  Decreased breath sounds left base with coarse crackles. Chest wall: no tenderness  Heart: S1 is variable, S2 is normal. No gallop appreciated. Distant heart sounds. No murmur appreciated.  Abdomen: Soft, nontender. Bowel sounds heard in all quadrants. Mild hepatomegaly with hepatojugular reflux present.  Extremities: edema 2+ bilateral below-knee and pitting type. and Homans sign is negative, no sign of DVT  Pulses: 2+ and symmetric  Neurologic: Grossly normal    Lab Results: BMP  Recent Labs  03/26/14 0329 03/27/14 0248 03/28/14 0252  NA 143 144 140  K 3.9 4.0 3.9  CL 101 101 100  CO2 33* 35* 33*  GLUCOSE 136* 101* 88  BUN 22 28* 23  CREATININE 1.22 1.31 1.02  CALCIUM 8.6 8.7 8.4  GFRNONAA 55* 50* 68*  GFRAA 64* 58* 79*    CBC  Recent Labs Lab 03/25/14 0235 03/26/14 0329  WBC 3.8* 4.9  RBC 4.85 4.87  HGB 14.4 14.2  HCT 45.1 44.8  PLT 101* 114*  MCV 93.0 92.0  MCH 29.7 29.2  MCHC 31.9 31.7  RDW  13.2 12.5  LYMPHSABS 0.3*  --   MONOABS 0.1  --   EOSABS 0.0  --   BASOSABS 0.0  --     HEMOGLOBIN A1C Lab Results  Component Value Date   HGBA1C 5.8* 03/26/2014   MPG 120* 03/26/2014    Cardiac Panel (last 3 results)  Recent Labs  03/24/14  TROPONINI <0.30    BNP (last 3 results)  Recent Labs  03/24/14  PROBNP 6491.0*    TSH  Recent Labs  08/10/13 1058  TSH 0.84    CHOLESTEROL  Recent Labs  03/25/14 0235  CHOL 86    Hepatic Function Panel  Recent Labs  08/10/13 1058 01/16/14 1612 03/24/14 0240 03/25/14 0235  PROT 6.6 6.8 6.0 6.0  ALBUMIN 3.9 4.1 3.2* 3.1*  AST '19 25 24 23  ' ALT '14 19 13 13  ' ALKPHOS 56 60 55 56  BILITOT 1.3* 1.5* 1.8* 1.1  BILIDIR 0.2 0.3 0.5*  --   IBILI  --   --  1.3*  --    G: Atrial fibrillation with controlled ventricular response, normal axis, PVC, nonspecific T abnormalities..  Echocardiogram 03/24/2014: Left ventricle: The cavity size was normal. Wall thickness was ormal. Systolic function was severely reduced. The estimated ejection fraction was in the range of 25% to 30%. Diffuse hypokinesis. -Mitral valve: There was mild regurgitation. Left atrium: The atrium was moderately dilated. - Right ventricle: The  cavity size was moderately dilated. Right atrium: The atrium was severely dilated. - Tricuspid valve: There was moderate-severe regurgitation. Pulmonary arteries: PA peak pressure: 33 mm Hg (S), consistent with mild pulmonary hypertension. Compared to the echocardiogram done on 03/15/2014, done in the outpatient basis in our office, no significant change. Scheduled Meds: . azithromycin  250 mg Oral Daily  . budesonide (PULMICORT) nebulizer solution  0.5 mg Nebulization BID  . cefTRIAXone (ROCEPHIN)  IV  1 g Intravenous Q24H  . dextromethorphan-guaiFENesin  1 tablet Oral BID  . digoxin  0.125 mg Oral Daily  . furosemide  20 mg Oral q morning - 10a  . isosorbide-hydrALAZINE  1 tablet Oral TID  . levalbuterol  0.63 mg  Nebulization BID  . losartan  25 mg Oral Daily  . metoprolol tartrate  50 mg Oral TID  . potassium chloride SA  20 mEq Oral Daily  . rivaroxaban  20 mg Oral Daily   Continuous Infusions:  PRN Meds:.clidinium-chlordiazePOXIDE, hydrALAZINE, levalbuterol   Assessment/Plan:  1. Acute on chronic systolic and diastolic heart failure with severe left systolic dysfunction, evidence of right ventricular failure also present.  Now well compensated to chronic systolic and diastolic heart failure. 2. Atrial fibrillation with controlled ventricular response, (occasional rvr), patient's heart rate has improved since increasing the dose of beta blocker. 3. Chronic kidney disease stage I, serum creatinine stable with EGFR 62 mL.  4. Chronic thrombocytopenia, etiology not known. 5.  Possible acute flareup of gouty arthritis left foot, although physical exam does not reveal any acute inflammatory signs.  Probably precipitated by diuretics.  Patient has history of gouty arthritis in the past.  Rec: patient is presently on appropriate medical therapy for  Cardiomyopathy and chronic systolic and diastolic heart failure. He may need therapy for gouty arthritis onset.  I will see him back on a when necessary basis while in the hospital, however he needs follow-up with my partner Dr. Vear Clock in 2 weeks after hospital discharge. F/U appointment set up in [facility] already.  Laverda Page, M.D. 03/29/2014, 1:20 PM [facility] [place] Pager: [phone] Office: [phone] If no answer: [phone]

## 2014-03-29 NOTE — Discharge Summary (Addendum)
Physician Discharge Summary  Ronald [name] [id number] DOB: September 16, 1934 DOA: 03/24/2014  PCP: Stephens Shire, MD  Admit date: 03/24/2014 Discharge date: 03/29/2014  Time spent: >30 minutes  Recommendations for Outpatient Follow-up:  1. Follow up with Dr. Woody Seller as scheduled (see AVS)  2. Call PCP to arrange after discharge follow up in the next 2-3 weeks 3.   Weigh and record daily weight (see instructions in AVS)  Discharge Diagnoses:    Acute systolic and diastolic CHF (congestive heart failure)-compensated   HYPERLIPIDEMIA   HYPERTENSION, BENIGN   VITAMIN B12 DEFICIENCY   IBS (irritable bowel syndrome)   B12 deficiency   Community acquired pneumonia   Atrial fibrillation with RVR-now rate controlled  Discharge Condition: stable  Diet recommendation: Heart Healthy, low sodium  Filed Weights   03/27/14 0424 03/28/14 0441 03/29/14 7673  Weight: 229 lb 1.6 oz (103.919 kg) 229 lb 4.8 oz (104.01 kg) 225 lb (102.059 kg)    History of present illness:  80 y.o. male with a history of HTN, GERD, and IBS, who presented to the ED with complaints of worsening SOB and Leg Swelling x3 months but worst for the prededing 3 days. He reported having DOE, and Orthopnea. He denied chest pain but reported palpitations. He also reported having fevers and chills. He was evaluated in the ED and was found to have a temperature to 101.6 and a chest x-ray revealed a mixed picture of cardiomegaly with CHF findings and a left mid-lung Infiltrate. He was also found to be in Atrial fibrillation with RVR. He was placed on IV Antibiotic to cover CAP, and was also placed on an IV Cardizem drip due to the A.Fib with RVR, and administered IV Lasix X 1 and then referred for medical admission.   Hospital Course:   Afib w/ RVR/transient bradycardia  Rate now well controlled - follow up with Dr. Woody Seller as outpt - Dr. Einar Gip recommended no CCB with underlying severe SHF - dc'd atenolol in favor of metoprolol -  digoxin was started this admission  Acute hypoxic respiratory failure:  A) LLL CAP  - has completed a more than adequate course of abx   B) Pulmonary edema /Severe bi-ventricular heart failure/EF 25%  -TTE with diffuse hypokinesis and EF 25% with RA/RV dilatation - consulted Cardiology this admission- pt to begin weighing and recording after discharge  C) Mild pulmonary HTN / moderate to severe TR  -See systolic CHF   Thrombocytopenia  -Platelets were normal Oct 2014 and low at presentation - etiology could be 2/2 infection - was on Xarelto and ? Lasix pre admit - no s/s bleeding   ?? Gout -complained of pain in left foot on date of dc but exam not c/w gout - able to ambulate w/o difficulty - follow clinically   HTN  -BP well controlled  IBS  -Quiescent   Reported hx of Hyperlipidemia  -Lipid panel actually quite favorable - ?accuracy of this diagnosis   Vitamin B12 deficiency   Hyperglycemia  -No reported hx of DM - A1c 5.8  Procedures:  5/24 TTE  - Left ventricle: Sstolic function severely reduced.  -LVEF=25% to 30%. Diffuse hypokinesis. - Mitral valve: mild regurgitation. - Left atrium: moderately dilated. - Right ventricle: moderately dilated. - Right atrium: severely dilated. - Atrial septum: No defect or patent foramen ovale was identified. - Tricuspid valve: moderate-severe regurgitation. - Pulmonary arteries: PA peak pressure: 33 mm Hg (S).  Consultations:  Dr. Einar Gip Cardiology  Discharge Exam: Filed Vitals:  03/29/14 1408  BP: 129/78  Pulse: 72  Temp: 98 F (36.7 C)  Resp: 18   General: No acute respiratory distress  Lungs: bibasilar fine crackles - no wheezing , RA - no observed orthopnea  Cardiovascular: irreg irreg - rate controlled at 80 - no appreciable gallup or rub or M  Abdomen: Nontender, nondistended, soft, bowel sounds positive, no rebound, no ascites, no appreciable mass  Extremities: No significant cyanosis, or clubbing; 1+ edema  bilateral lower extremities  Discharge Instructions     Discharge Instructions   (HEART FAILURE PATIENTS) Call MD:  Anytime you have any of the following symptoms: 1) 3 pound weight gain in 24 hours or 5 pounds in 1 week 2) shortness of breath, with or without a dry hacking cough 3) swelling in the hands, feet or stomach 4) if you have to sleep on extra pillows at night in order to breathe.    Complete by:  As directed      Call MD for:  extreme fatigue    Complete by:  As directed      Call MD for:  persistant dizziness or light-headedness    Complete by:  As directed      Call MD for:  temperature >100.4    Complete by:  As directed      Diet - low sodium heart healthy    Complete by:  As directed      Increase activity slowly    Complete by:  As directed             Medication List    STOP taking these medications       amLODipine 5 MG tablet  Commonly known as:  NORVASC     atenolol 50 MG tablet  Commonly known as:  TENORMIN      TAKE these medications   Spoke w/ Pharmacist at [facility], and cancelled [facility] as no further abx tx is indicated       clidinium-chlordiazePOXIDE 5-2.5 MG per capsule  Commonly known as:  LIBRAX  Take 1 capsule by mouth 2 (two) times daily as needed (ibs).     digoxin 0.125 MG tablet  Commonly known as:  LANOXIN  Take 1 tablet (0.125 mg total) by mouth daily.     furosemide 20 MG tablet  Commonly known as:  LASIX  Take 1/2 of your 40 mg tablets daily at 10 am     isosorbide-hydrALAZINE 20-37.5 MG per tablet  Commonly known as:  BIDIL  Take 1 tablet by mouth 3 (three) times daily.     losartan 25 MG tablet  Commonly known as:  COZAAR  Take 1/2 tab of your 50 mg tablets daily     metoprolol 50 MG tablet  Commonly known as:  LOPRESSOR  Take 1 tablet (50 mg total) by mouth 3 (three) times daily.     potassium chloride SA 20 MEQ tablet  Commonly known as:  K-DUR,KLOR-CON  Take 20 mEq by mouth daily.     Rivaroxaban 15 MG  Tabs tablet  Commonly known as:  XARELTO  Take 15 mg by mouth daily.       Allergies  Allergen Reactions  . Lactose Intolerance (Gi)    Follow-up Information   Follow up with Despina Hick, MD On 04/10/2014. (3:30 pm appointment. Bring all medications)    Specialty:  Cardiology   Contact information:   [facility], [address] [place] [address] [phone]  Call BURNETT,BRENT A, MD. (to be seen in the next 2-3 weeks after discharge)    Specialty:  Family Medicine   Contact information:   [address] [place] [place] [address] [phone]      Microbiology: Recent Results (from the past 240 hour(s))  CULTURE, BLOOD (ROUTINE X 2)     Status: None   Collection Time    03/24/14  1:30 AM      Result Value Ref Range Status   Specimen Description BLOOD LEFT HAND   Final   Special Requests BOTTLES DRAWN AEROBIC AND ANAEROBIC 10CC EA   Final   Culture  Setup Time     Final   Value: 03/24/2014 09:46     Performed at [facility]   Culture     Final   Value:        BLOOD CULTURE RECEIVED NO GROWTH TO DATE CULTURE WILL BE HELD FOR 5 DAYS BEFORE ISSUING A FINAL NEGATIVE REPORT     Performed at [facility]   Report Status PENDING   Incomplete  CULTURE, BLOOD (ROUTINE X 2)     Status: None   Collection Time    03/24/14  1:40 AM      Result Value Ref Range Status   Specimen Description BLOOD RIGHT HAND   Final   Special Requests BOTTLES DRAWN AEROBIC ONLY 10CC   Final   Culture  Setup Time     Final   Value: 03/24/2014 09:46     Performed at [facility]   Culture     Final   Value:        BLOOD CULTURE RECEIVED NO GROWTH TO DATE CULTURE WILL BE HELD FOR 5 DAYS BEFORE ISSUING A FINAL NEGATIVE REPORT     Performed at [facility]   Report Status PENDING   Incomplete  MRSA PCR SCREENING     Status: None   Collection Time    03/24/14  5:48 AM      Result Value Ref Range Status   MRSA  by PCR NEGATIVE  NEGATIVE Final   Comment:            The GeneXpert MRSA Assay (FDA     approved for NASAL specimens     only), is one component of a     comprehensive MRSA colonization     surveillance program. It is not     intended to diagnose MRSA     infection nor to guide or     monitor treatment for     MRSA infections.    Labs: Basic Metabolic Panel:  Recent Labs Lab 03/24/14 03/25/14 0235 03/26/14 0329 03/27/14 0248 03/28/14 0252  NA 141 139 143 144 140  K 3.8 3.7 3.9 4.0 3.9  CL 104 97 101 101 100  CO2 25 32 33* 35* 33*  GLUCOSE 116* 145* 136* 101* 88  BUN 13 15 22  28* 23  CREATININE 1.47* 1.25 1.22 1.31 1.02  CALCIUM 9.0 8.1* 8.6 8.7 8.4  MG  --  1.8  --   --   --    Liver Function Tests:  Recent Labs Lab 03/24/14 0240 03/25/14 0235  AST 24 23  ALT 13 13  ALKPHOS 55 56  BILITOT 1.8* 1.1  PROT 6.0 6.0  ALBUMIN 3.2* 3.1*   CBC:  Recent Labs Lab 03/24/14 03/25/14 0235 03/26/14 0329  WBC 5.4 3.8* 4.9  NEUTROABS  --  3.4  --  HGB 15.2 14.4 14.2  HCT 46.6 45.1 44.8  MCV 91.7 93.0 92.0  PLT 110* 101* 114*   Cardiac Enzymes:  Recent Labs Lab 03/24/14  TROPONINI <0.30   BNP: BNP (last 3 results)  Recent Labs  03/24/14  PROBNP 6491.0*   Signed:  Samella [name] ANP [facility] 03/29/2014, 3:13 PM  I have personally examined this patient and reviewed the entire database. I have reviewed the above note, made any necessary editorial changes, and agree with its content.  Cherene Altes, MD [facility]

## 2014-03-29 NOTE — Progress Notes (Signed)
Heart Failure Navigator Consult Note  Presentation: Ronald [name] is a 80 y.o. male with a history of HTN, GERD, IBS, who presents to the ED with complaints of worsening SOB and Lower Leg Swelling x3 months but worst for the past 3 days. He reports having DOE, and Orthopnea. He denies having chest pain but reports having palpitations. He also reports having fevers and chills over the past 24 hours. He was evaluated in the ED and was found to have a temperature to 101.6 and a chest x-ray tht revealed a mixed picture of cardiomegaly with CHF findings and a left mid-lung Infiltrate and a Pro-BNP of 6491.0. He was also found to be in Atrial fibrillation with RVR. He was placed on IV Antibiotic to cover CAP, and was also placed on an IV Cardizem drip due to the A.Fib with RVR, and administered IV Lasix X 1 and then referred for medical admission    Past Medical History  Diagnosis Date  . Irritable bowel syndrome   . Lactose intolerance   . Other and unspecified hyperlipidemia   . Reflux esophagitis   . Essential hypertension, benign   . Allergic rhinitis, cause unspecified   . Vitamin B12 deficiency   . BPH (benign prostatic hypertrophy)   . Personal history of colonic polyps 03/04/1992    adenomatous polyp  . Hiatal hernia 2014  . Schatzki's ring 2014  . Pneumonia   . Hyperlipidemia   . GERD (gastroesophageal reflux disease)   . Sleep apnea   . Glaucoma     History   Social History  . Marital Status: Married    Spouse Name: N/A    Number of Children: N/A  . Years of Education: N/A   Social History Main Topics  . Smoking status: Former Smoker -- 1.00 packs/day    Types: Cigarettes    Quit date: 11/02/1983  . Smokeless tobacco: None  . Alcohol Use: Yes     Comment: 2oz wine occassionally per day  . Drug Use: No  . Sexual Activity: None   Other Topics Concern  . None   Social History Narrative  . None    ECHO:Study Conclusions--03/24/14  - Left ventricle: The cavity  size was normal. Wall thickness was normal. Systolic function was severely reduced. The estimated ejection fraction was in the range of 25% to 30%. Diffuse hypokinesis. - Mitral valve: There was mild regurgitation. - Left atrium: The atrium was moderately dilated. - Right ventricle: The cavity size was moderately dilated. - Right atrium: The atrium was severely dilated. - Atrial septum: No defect or patent foramen ovale was identified. - Tricuspid valve: There was moderate-severe regurgitation. - Pulmonary arteries: PA peak pressure: 33 mm Hg (S).   BNP    Component Value Date/Time   PROBNP 6491.0* 03/24/2014 0000    Education Assessment and Provision:  Detailed education and instructions provided on heart failure disease management including the following:  Signs and symptoms of Heart Failure When to call the physician Importance of daily weights Low sodium diet  Fluid restriction Medication management Anticipated future follow-up appointments  Patient education given on each of the above topics.  Patient and wife acknowledge understanding and acceptance of all instructions.  He was very interested in HF teaching and asked many questions.  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  He does not currently weigh daily--however says that he will after discharge.  He eats out frequently during "work  hours"--and we discussed the difficulty with eating out and remaining on a low sodium diet.  He denies any issues with getting and taking medications.  Education Materials:  "Living Better With Heart Failure" Booklet, Daily Weight Tracker Tool   High Risk Criteria for Readmission and/or Poor Patient Outcomes:   EF <30%- Yes--25-30%  2 or more admissions in 6 months- No  Difficult social situation- No  Demonstrates medication noncompliance- No   Barriers of Care:  Knowledge of HF and compliance  Discharge Planning:   Plans  to discharge to home with his wife.  Will need ongoing HF education as well as compliance reinforcement.  Follow-up appointment 04/10/14 at 3:30pm.

## 2014-03-29 NOTE — Evaluation (Signed)
Physical Therapy Evaluation Patient Details Name: Ronald [name] MRN: [id number] DOB: September 05, 1934 Today's Date: 03/29/2014   History of Present Illness  Pt adm with SOB and found to have PNA.  Clinical Impression  Pt doing well with mobility and no further PT needed.  Ready for dc from PT standpoint.      Follow Up Recommendations No PT follow up    Equipment Recommendations  None recommended by PT    Recommendations for Other Services       Precautions / Restrictions Precautions Precautions: None      Mobility  Bed Mobility                  Transfers Overall transfer level: Independent                  Ambulation/Gait Ambulation/Gait assistance: Independent Ambulation Distance (Feet): 200 Feet Assistive device: None Gait Pattern/deviations: Antalgic     General Gait Details: slight limp on lt due to foot pain.  Stairs            Wheelchair Mobility    Modified Rankin (Stroke Patients Only)       Balance                                             Pertinent Vitals/Pain Pain in lt foot. Pt thinks he has gout.    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     Hand Dominance        Extremity/Trunk Assessment   Upper Extremity Assessment: Overall WFL for tasks assessed           Lower Extremity Assessment: Overall WFL for tasks assessed         Communication   Communication: No difficulties  Cognition Arousal/Alertness: Awake/alert Behavior During Therapy: WFL for tasks assessed/performed Overall Cognitive Status: Within Functional Limits for tasks assessed                       General Comments      Exercises        Assessment/Plan    PT Assessment Patent does not need any further PT services  PT Diagnosis     PT Problem List    PT Treatment Interventions     PT Goals (Current goals can be found in the Care Plan section) Acute Rehab PT Goals PT Goal Formulation: No goals set, d/c therapy    Frequency     Barriers to discharge        Co-evaluation               End of Session   Activity Tolerance: Patient tolerated treatment well Patient left: in bed (sitting EOB) Nurse Communication: Mobility status         Time: 5329-9242 PT Time Calculation (min): 8 min   Charges:   PT Evaluation $Initial PT Evaluation Tier I: 1 Procedure     PT G CodesShary Decamp Wilgus Deyton 03/29/2014, 12:45 PM  [employer] PT [phone]

## 2014-03-30 LAB — CULTURE, BLOOD (ROUTINE X 2)
CULTURE: NO GROWTH
Culture: NO GROWTH

## 2014-04-25 ENCOUNTER — Other Ambulatory Visit: Payer: Self-pay | Admitting: Physician Assistant

## 2014-05-06 ENCOUNTER — Other Ambulatory Visit: Payer: Self-pay | Admitting: Physician Assistant

## 2014-05-09 ENCOUNTER — Other Ambulatory Visit: Payer: Self-pay | Admitting: Nurse Practitioner

## 2014-05-10 ENCOUNTER — Other Ambulatory Visit: Payer: Self-pay | Admitting: Nurse Practitioner

## 2014-06-19 ENCOUNTER — Encounter: Payer: Self-pay | Admitting: Gastroenterology

## 2014-09-23 ENCOUNTER — Other Ambulatory Visit: Payer: Self-pay | Admitting: Family Medicine

## 2014-09-23 ENCOUNTER — Ambulatory Visit
Admission: RE | Admit: 2014-09-23 | Discharge: 2014-09-23 | Disposition: A | Discharge status: Home / Self Care | Payer: Medicare HMO | Source: Ambulatory Visit | Attending: Family Medicine | Admitting: Family Medicine

## 2014-09-23 DIAGNOSIS — N5089 Other specified disorders of the male genital organs: Secondary | ICD-10-CM

## 2014-10-24 ENCOUNTER — Other Ambulatory Visit: Payer: Self-pay | Admitting: Nurse Practitioner

## 2015-03-12 ENCOUNTER — Other Ambulatory Visit: Payer: Self-pay | Admitting: Nurse Practitioner

## 2015-11-18 DIAGNOSIS — H401133 Primary open-angle glaucoma, bilateral, severe stage: Secondary | ICD-10-CM | POA: Diagnosis not present

## 2015-11-18 DIAGNOSIS — H2511 Age-related nuclear cataract, right eye: Secondary | ICD-10-CM | POA: Diagnosis not present

## 2015-11-28 DIAGNOSIS — J069 Acute upper respiratory infection, unspecified: Secondary | ICD-10-CM | POA: Diagnosis not present

## 2015-12-04 DIAGNOSIS — I878 Other specified disorders of veins: Secondary | ICD-10-CM | POA: Insufficient documentation

## 2015-12-04 DIAGNOSIS — M1A0710 Idiopathic chronic gout, right ankle and foot, without tophus (tophi): Secondary | ICD-10-CM | POA: Insufficient documentation

## 2015-12-04 DIAGNOSIS — E7800 Pure hypercholesterolemia, unspecified: Secondary | ICD-10-CM | POA: Insufficient documentation

## 2015-12-04 DIAGNOSIS — J069 Acute upper respiratory infection, unspecified: Secondary | ICD-10-CM | POA: Diagnosis not present

## 2015-12-04 DIAGNOSIS — I10 Essential (primary) hypertension: Secondary | ICD-10-CM | POA: Insufficient documentation

## 2015-12-22 DIAGNOSIS — Z961 Presence of intraocular lens: Secondary | ICD-10-CM | POA: Diagnosis not present

## 2015-12-22 DIAGNOSIS — H401133 Primary open-angle glaucoma, bilateral, severe stage: Secondary | ICD-10-CM | POA: Diagnosis not present

## 2015-12-22 DIAGNOSIS — H18413 Arcus senilis, bilateral: Secondary | ICD-10-CM | POA: Diagnosis not present

## 2015-12-22 DIAGNOSIS — H00029 Hordeolum internum unspecified eye, unspecified eyelid: Secondary | ICD-10-CM | POA: Diagnosis not present

## 2015-12-22 DIAGNOSIS — H02839 Dermatochalasis of unspecified eye, unspecified eyelid: Secondary | ICD-10-CM | POA: Diagnosis not present

## 2016-01-26 DIAGNOSIS — I4891 Unspecified atrial fibrillation: Secondary | ICD-10-CM | POA: Diagnosis not present

## 2016-01-26 DIAGNOSIS — I10 Essential (primary) hypertension: Secondary | ICD-10-CM | POA: Diagnosis not present

## 2016-01-26 DIAGNOSIS — I5022 Chronic systolic (congestive) heart failure: Secondary | ICD-10-CM | POA: Diagnosis not present

## 2016-02-04 DIAGNOSIS — L821 Other seborrheic keratosis: Secondary | ICD-10-CM | POA: Diagnosis not present

## 2016-02-04 DIAGNOSIS — L309 Dermatitis, unspecified: Secondary | ICD-10-CM | POA: Diagnosis not present

## 2016-03-08 DIAGNOSIS — L299 Pruritus, unspecified: Secondary | ICD-10-CM | POA: Insufficient documentation

## 2016-05-10 DIAGNOSIS — L299 Pruritus, unspecified: Secondary | ICD-10-CM | POA: Diagnosis not present

## 2016-05-25 DIAGNOSIS — I4891 Unspecified atrial fibrillation: Secondary | ICD-10-CM | POA: Diagnosis not present

## 2016-05-25 DIAGNOSIS — I5022 Chronic systolic (congestive) heart failure: Secondary | ICD-10-CM | POA: Diagnosis not present

## 2016-05-25 DIAGNOSIS — I10 Essential (primary) hypertension: Secondary | ICD-10-CM | POA: Diagnosis not present

## 2016-06-22 DIAGNOSIS — L821 Other seborrheic keratosis: Secondary | ICD-10-CM | POA: Diagnosis not present

## 2016-06-22 DIAGNOSIS — R21 Rash and other nonspecific skin eruption: Secondary | ICD-10-CM | POA: Diagnosis not present

## 2016-06-22 DIAGNOSIS — L309 Dermatitis, unspecified: Secondary | ICD-10-CM | POA: Diagnosis not present

## 2016-06-28 DIAGNOSIS — H401133 Primary open-angle glaucoma, bilateral, severe stage: Secondary | ICD-10-CM | POA: Diagnosis not present

## 2016-06-28 DIAGNOSIS — H2511 Age-related nuclear cataract, right eye: Secondary | ICD-10-CM | POA: Diagnosis not present

## 2016-08-20 DIAGNOSIS — H401133 Primary open-angle glaucoma, bilateral, severe stage: Secondary | ICD-10-CM | POA: Diagnosis not present

## 2016-08-26 ENCOUNTER — Emergency Department (HOSPITAL_COMMUNITY): Payer: PPO

## 2016-08-26 ENCOUNTER — Encounter (HOSPITAL_COMMUNITY): Payer: Self-pay

## 2016-08-26 ENCOUNTER — Emergency Department (HOSPITAL_COMMUNITY)
Admission: EM | Admit: 2016-08-26 | Discharge: 2016-08-26 | Disposition: A | Discharge status: Home / Self Care | Payer: PPO | Attending: Emergency Medicine | Admitting: Emergency Medicine

## 2016-08-26 DIAGNOSIS — W540XXA Bitten by dog, initial encounter: Secondary | ICD-10-CM | POA: Insufficient documentation

## 2016-08-26 DIAGNOSIS — S61257A Open bite of left little finger without damage to nail, initial encounter: Secondary | ICD-10-CM | POA: Diagnosis not present

## 2016-08-26 DIAGNOSIS — Z7901 Long term (current) use of anticoagulants: Secondary | ICD-10-CM | POA: Insufficient documentation

## 2016-08-26 DIAGNOSIS — Y999 Unspecified external cause status: Secondary | ICD-10-CM | POA: Insufficient documentation

## 2016-08-26 DIAGNOSIS — I110 Hypertensive heart disease with heart failure: Secondary | ICD-10-CM | POA: Insufficient documentation

## 2016-08-26 DIAGNOSIS — S61217A Laceration without foreign body of left little finger without damage to nail, initial encounter: Secondary | ICD-10-CM | POA: Diagnosis not present

## 2016-08-26 DIAGNOSIS — Z87891 Personal history of nicotine dependence: Secondary | ICD-10-CM | POA: Diagnosis not present

## 2016-08-26 DIAGNOSIS — I5041 Acute combined systolic (congestive) and diastolic (congestive) heart failure: Secondary | ICD-10-CM | POA: Diagnosis not present

## 2016-08-26 DIAGNOSIS — Y929 Unspecified place or not applicable: Secondary | ICD-10-CM | POA: Diagnosis not present

## 2016-08-26 DIAGNOSIS — Y939 Activity, unspecified: Secondary | ICD-10-CM | POA: Diagnosis not present

## 2016-08-26 DIAGNOSIS — S61452A Open bite of left hand, initial encounter: Secondary | ICD-10-CM

## 2016-08-26 DIAGNOSIS — M7989 Other specified soft tissue disorders: Secondary | ICD-10-CM | POA: Diagnosis not present

## 2016-08-26 DIAGNOSIS — S61259A Open bite of unspecified finger without damage to nail, initial encounter: Secondary | ICD-10-CM

## 2016-08-26 MED ORDER — AMOXICILLIN-POT CLAVULANATE 875-125 MG PO TABS: 1.0000 | ORAL_TABLET | Freq: Two times a day (BID) | ORAL | 0 refills | Status: DC

## 2016-08-26 MED ORDER — TETANUS-DIPHTH-ACELL PERTUSSIS 5-2.5-18.5 LF-MCG/0.5 IM SUSP
0.5000 mL | Freq: Once | INTRAMUSCULAR | Status: AC
  Administered 2016-08-26: 0.5 mL via INTRAMUSCULAR
  Filled 2016-08-26: qty 0.5

## 2016-08-26 NOTE — ED Provider Notes (Signed)
[facility] DEPT Provider Note   CSN: [id number] Arrival date & time: 08/26/16  1028  By signing my name below, I, Evelene Croon, attest that this documentation has been prepared under the direction and in the presence of non-physician practitioner, Jeannett Senior, PA-C. Electronically Signed: Evelene Croon, Scribe. 08/26/2016. 11:08 AM.   History   Chief Complaint Chief Complaint  Patient presents with  . Animal Bite    The history is provided by the patient. No language interpreter was used.    HPI Comments:  Ronald [name] is a 82 y.o. male who presents to the Emergency Department complaining of an animal bite which occurred ~0645 this AM. Pt reports wound to his left pinky finger. He states he was bitten by his own dog; dog's vaccinations are UTD. Pt believes his tetanus is UTD within the last 5 years but is not sure. He is currently on Xarelto. Pt has no other complaints or injuries at this time.     Past Medical History:  Diagnosis Date  . Allergic rhinitis, cause unspecified   . BPH (benign prostatic hypertrophy)   . Essential hypertension, benign   . GERD (gastroesophageal reflux disease)   . Glaucoma   . Hiatal hernia 2014  . Hyperlipidemia   . Irritable bowel syndrome   . Lactose intolerance   . Other and unspecified hyperlipidemia   . Personal history of colonic polyps 03/04/1992   adenomatous polyp  . Pneumonia   . Reflux esophagitis   . Schatzki's ring 2014  . Sleep apnea   . Vitamin B12 deficiency     Patient Active Problem List   Diagnosis Date Noted  . Systolic CHF, acute ([place]) 03/26/2014  . Community acquired pneumonia 03/24/2014  . Acute diastolic CHF (congestive heart failure) ([place]) 03/24/2014  . Atrial fibrillation with RVR ([place]) 03/24/2014  . Bloating 01/16/2014  . Weight gain 01/16/2014  . IBS (irritable bowel syndrome) 01/27/2012  . GERD (gastroesophageal reflux disease) 01/27/2012  . B12 deficiency 01/27/2012  . VITAMIN B12  DEFICIENCY 11/23/2010  . COLONIC POLYPS, HX OF 11/20/2010  . HYPERLIPIDEMIA 11/19/2010  . HYPERTENSION, BENIGN 11/19/2010  . ALLERGIC RHINITIS 11/19/2010  . REFLUX ESOPHAGITIS 11/19/2010  . IRRITABLE BOWEL SYNDROME 11/19/2010    Past Surgical History:  Procedure Laterality Date  . CATARACT EXTRACTION Left   . EYE SURGERY Left   . NOSE SURGERY    . PROSTATE SURGERY         Home Medications    Prior to Admission medications   Medication Sig Start Date End Date Taking? Authorizing Provider  azithromycin (ZITHROMAX) 250 MG tablet Take daily for 6 days 03/29/14   Samella Parr, NP  clidinium-chlordiazePOXIDE (LIBRAX) 5-2.5 MG per capsule Take 1 capsule 1-2 times daily as needed. 05/10/14   Willia Craze, NP  digoxin (LANOXIN) 0.125 MG tablet Take 1 tablet (0.125 mg total) by mouth daily. 03/29/14   Samella Parr, NP  furosemide (LASIX) 20 MG tablet Take 1/2 of your 40 mg tablets daily at 10 am 03/29/14   Samella Parr, NP  isosorbide-hydrALAZINE (BIDIL) 20-37.5 MG per tablet Take 1 tablet by mouth 3 (three) times daily. 03/29/14   Samella Parr, NP  losartan (COZAAR) 25 MG tablet Take 1/2 tab of your 50 mg tablets daily 03/29/14   Samella Parr, NP  metoprolol (LOPRESSOR) 50 MG tablet Take 1 tablet (50 mg total) by mouth 3 (three) times daily. 03/29/14   Samella Parr, NP  potassium chloride SA (  K-DUR,KLOR-CON) 20 MEQ tablet Take 20 mEq by mouth daily.    Historical Provider, MD  Rivaroxaban (XARELTO) 15 MG TABS tablet Take 15 mg by mouth daily.    Historical Provider, MD    Family History History reviewed. No pertinent family history.  Social History Social History  Substance Use Topics  . Smoking status: Former Smoker    Packs/day: 1.00    Types: Cigarettes    Quit date: 11/02/1983  . Smokeless tobacco: Never Used  . Alcohol use Yes     Comment: 2oz wine occassionally per day     Allergies   Lactose intolerance (gi)   Review of Systems Review of Systems    Constitutional: Negative for fever.  Skin: Positive for wound.  Neurological: Negative for weakness.     Physical Exam Updated Vital Signs BP 166/97 (BP Location: Right Arm)   Pulse 83   Temp 97.6 F (36.4 C) (Oral)   Resp 16   Ht 6\' 1"  (1.854 m)   Wt 200 lb (90.7 kg)   SpO2 98%   BMI 26.39 kg/m   Physical Exam  Constitutional: He is oriented to person, place, and time. He appears well-developed and well-nourished. No distress.  HENT:  Head: Normocephalic and atraumatic.  Eyes: Conjunctivae are normal.  Cardiovascular: Normal rate.   Pulmonary/Chest: Effort normal.  Abdominal: He exhibits no distension.  Musculoskeletal:  Several puncture wounds to the left distal fifth finger, with one laceration approximately 1.5 cm, gaping. Swelling to the distal finger noted. Full range of motion of the finger at all joints.  Neurological: He is alert and oriented to person, place, and time.  Skin: Skin is warm and dry.  Psychiatric: He has a normal mood and affect.  Nursing note and vitals reviewed.    ED Treatments / Results  DIAGNOSTIC STUDIES:  Oxygen Saturation is 98% on RA, normal by my interpretation.    COORDINATION OF CARE:  11:06 AM Discussed treatment plan with pt at bedside and pt agreed to plan.  Labs (all labs ordered are listed, but only abnormal results are displayed) Labs Reviewed - No data to display  EKG  EKG Interpretation None       Radiology No results found.  Procedures Procedures (including critical care time)  Medications Ordered in ED Medications - No data to display   Initial Impression / Assessment and Plan / ED Course  I have reviewed the triage vital signs and the nursing notes.  Pertinent labs & imaging results that were available during my care of the patient were reviewed by me and considered in my medical decision making (see chart for details).  Clinical Course   Pt.  with a dog bite to the left fifth finger. One of the  wounds is gaping. Wound soaked in iodine and saline. Irrigated with syringe thoroughly. Tetanus updated. X-ray with no fracture, showing degenerative changes, showing a faint calcific density along the radial aspect of the proximal left fifth digit, no concern for foreign body at this location. Discussed with Dr. Billy Fischer, agrees with the plan, will leave the wound open, I approximated the edges with Steri-Strips. Sterile dressing applied. Home with Augmentin. Follow-up with primary care doctor for recheck. Return precautions discussed. Dogs rabies all up to date   Final Clinical Impressions(s) / ED Diagnoses   Final diagnoses:  Dog bite  Dog bite of multiple sites of left hand and fingers, initial encounter    New Prescriptions Discharge Medication List as of 08/26/2016 12:34 PM  START taking these medications   Details  amoxicillin-clavulanate (AUGMENTIN) 875-125 MG tablet Take 1 tablet by mouth 2 (two) times daily., Starting Thu 08/26/2016, Print      I personally performed the services described in this documentation, which was scribed in my presence. The recorded information has been reviewed and is accurate.    Jeannett Senior, PA-C 08/26/16 1545    Gareth Morgan, MD 08/26/16 [phone]

## 2016-08-26 NOTE — ED Triage Notes (Addendum)
Pt reports he was bitten by his dog this morning on his pinky finger. Pt has finger wrapped and bleeding is controlled at this time. Pt has 2 lacerations to the finger. Dog has had rabies vaccination. Pt takes xarelto.

## 2016-08-26 NOTE — Discharge Instructions (Signed)
Keep your wound clean and dry. Dressing changes twice a day. Watch for any signs of infection. Take Augmentin as prescribed until all gone. Follow-up with your doctor in 2-3 days for wound recheck.

## 2016-08-26 NOTE — ED Notes (Signed)
Papers and medications reviewed with patient and wife. Verbalize understanding and finger wrapped

## 2016-08-26 NOTE — ED Notes (Signed)
Finger is wrapped. Bandage clean an ddry

## 2016-09-02 DIAGNOSIS — N432 Other hydrocele: Secondary | ICD-10-CM | POA: Diagnosis not present

## 2016-09-03 DIAGNOSIS — N432 Other hydrocele: Secondary | ICD-10-CM | POA: Insufficient documentation

## 2016-09-06 DIAGNOSIS — Z23 Encounter for immunization: Secondary | ICD-10-CM | POA: Diagnosis not present

## 2016-09-06 DIAGNOSIS — B86 Scabies: Secondary | ICD-10-CM | POA: Diagnosis not present

## 2016-09-20 DIAGNOSIS — H401133 Primary open-angle glaucoma, bilateral, severe stage: Secondary | ICD-10-CM | POA: Diagnosis not present

## 2016-10-01 DIAGNOSIS — E538 Deficiency of other specified B group vitamins: Secondary | ICD-10-CM | POA: Diagnosis not present

## 2016-10-01 DIAGNOSIS — Z79899 Other long term (current) drug therapy: Secondary | ICD-10-CM | POA: Diagnosis not present

## 2016-10-01 DIAGNOSIS — E7800 Pure hypercholesterolemia, unspecified: Secondary | ICD-10-CM | POA: Diagnosis not present

## 2016-10-01 DIAGNOSIS — R430 Anosmia: Secondary | ICD-10-CM | POA: Diagnosis not present

## 2016-10-01 DIAGNOSIS — N401 Enlarged prostate with lower urinary tract symptoms: Secondary | ICD-10-CM | POA: Diagnosis not present

## 2016-10-01 DIAGNOSIS — Z7901 Long term (current) use of anticoagulants: Secondary | ICD-10-CM | POA: Diagnosis not present

## 2016-10-01 DIAGNOSIS — N432 Other hydrocele: Secondary | ICD-10-CM | POA: Diagnosis not present

## 2016-10-01 DIAGNOSIS — I878 Other specified disorders of veins: Secondary | ICD-10-CM | POA: Diagnosis not present

## 2016-10-01 DIAGNOSIS — R351 Nocturia: Secondary | ICD-10-CM | POA: Diagnosis not present

## 2016-10-01 DIAGNOSIS — N433 Hydrocele, unspecified: Secondary | ICD-10-CM | POA: Diagnosis not present

## 2016-10-01 DIAGNOSIS — K589 Irritable bowel syndrome without diarrhea: Secondary | ICD-10-CM | POA: Diagnosis not present

## 2016-10-01 DIAGNOSIS — J309 Allergic rhinitis, unspecified: Secondary | ICD-10-CM | POA: Diagnosis not present

## 2016-10-01 DIAGNOSIS — I5022 Chronic systolic (congestive) heart failure: Secondary | ICD-10-CM | POA: Diagnosis not present

## 2016-10-01 DIAGNOSIS — I110 Hypertensive heart disease with heart failure: Secondary | ICD-10-CM | POA: Diagnosis not present

## 2016-10-01 DIAGNOSIS — K219 Gastro-esophageal reflux disease without esophagitis: Secondary | ICD-10-CM | POA: Diagnosis not present

## 2016-10-01 DIAGNOSIS — M1A0710 Idiopathic chronic gout, right ankle and foot, without tophus (tophi): Secondary | ICD-10-CM | POA: Diagnosis not present

## 2016-10-01 DIAGNOSIS — I4891 Unspecified atrial fibrillation: Secondary | ICD-10-CM | POA: Diagnosis not present

## 2016-10-01 HISTORY — PX: HYDROCELE EXCISION: SHX482

## 2016-10-04 DIAGNOSIS — I10 Essential (primary) hypertension: Secondary | ICD-10-CM | POA: Diagnosis not present

## 2016-10-04 DIAGNOSIS — I5022 Chronic systolic (congestive) heart failure: Secondary | ICD-10-CM | POA: Diagnosis not present

## 2016-10-04 DIAGNOSIS — I4891 Unspecified atrial fibrillation: Secondary | ICD-10-CM | POA: Diagnosis not present

## 2016-10-11 DIAGNOSIS — N433 Hydrocele, unspecified: Secondary | ICD-10-CM | POA: Diagnosis not present

## 2016-10-22 DIAGNOSIS — Z87898 Personal history of other specified conditions: Secondary | ICD-10-CM | POA: Diagnosis not present

## 2016-11-05 DIAGNOSIS — Z09 Encounter for follow-up examination after completed treatment for conditions other than malignant neoplasm: Secondary | ICD-10-CM | POA: Diagnosis not present

## 2016-11-23 DIAGNOSIS — K589 Irritable bowel syndrome without diarrhea: Secondary | ICD-10-CM | POA: Diagnosis not present

## 2016-11-23 DIAGNOSIS — J069 Acute upper respiratory infection, unspecified: Secondary | ICD-10-CM | POA: Diagnosis not present

## 2017-01-18 DIAGNOSIS — H401133 Primary open-angle glaucoma, bilateral, severe stage: Secondary | ICD-10-CM | POA: Diagnosis not present

## 2017-01-26 ENCOUNTER — Ambulatory Visit: Payer: PPO | Admitting: Physician Assistant

## 2017-01-31 DIAGNOSIS — I5022 Chronic systolic (congestive) heart failure: Secondary | ICD-10-CM | POA: Diagnosis not present

## 2017-01-31 DIAGNOSIS — I4891 Unspecified atrial fibrillation: Secondary | ICD-10-CM | POA: Diagnosis not present

## 2017-01-31 DIAGNOSIS — I10 Essential (primary) hypertension: Secondary | ICD-10-CM | POA: Diagnosis not present

## 2017-01-31 DIAGNOSIS — R600 Localized edema: Secondary | ICD-10-CM | POA: Diagnosis not present

## 2017-02-01 DIAGNOSIS — I4891 Unspecified atrial fibrillation: Secondary | ICD-10-CM | POA: Diagnosis not present

## 2017-02-01 DIAGNOSIS — I5022 Chronic systolic (congestive) heart failure: Secondary | ICD-10-CM | POA: Diagnosis not present

## 2017-02-07 DIAGNOSIS — I5022 Chronic systolic (congestive) heart failure: Secondary | ICD-10-CM | POA: Diagnosis not present

## 2017-02-07 DIAGNOSIS — I4891 Unspecified atrial fibrillation: Secondary | ICD-10-CM | POA: Diagnosis not present

## 2017-02-07 DIAGNOSIS — I10 Essential (primary) hypertension: Secondary | ICD-10-CM | POA: Diagnosis not present

## 2017-02-07 DIAGNOSIS — R600 Localized edema: Secondary | ICD-10-CM | POA: Diagnosis not present

## 2017-02-09 ENCOUNTER — Encounter: Payer: Self-pay | Admitting: Gastroenterology

## 2017-02-09 ENCOUNTER — Encounter (INDEPENDENT_AMBULATORY_CARE_PROVIDER_SITE_OTHER): Payer: Self-pay

## 2017-02-09 ENCOUNTER — Ambulatory Visit (INDEPENDENT_AMBULATORY_CARE_PROVIDER_SITE_OTHER): Payer: PPO | Admitting: Gastroenterology

## 2017-02-09 VITALS — BP 136/72 | HR 78 | Ht 73.0 in | Wt 210.0 lb

## 2017-02-09 DIAGNOSIS — R1011 Right upper quadrant pain: Secondary | ICD-10-CM | POA: Diagnosis not present

## 2017-02-09 NOTE — Progress Notes (Addendum)
02/09/2017 Ronald [name] [id number] 1933-11-26   HISTORY OF PRESENT ILLNESS:  This is a pleasant 83 year-old male who is previously known to Dr. Sharlett Iles.  His care is being assumed by Dr. Hilarie Fredrickson.  The patient has past medical history of atrial fibrillation on Xarelto, congestive heart failure with significant lower extremity edema (EF 25-30% on ECHO in 2015).  He is here today with complaints of right upper quadrant abdominal pain/discomfort. He says that he really would not consider it a pain, he just notices it. He says that it feels like there is a "knot" there and sometimes it "quivers". He says that it has been coming and going for quite a while. He says that he moves his bowels well. Denies absolutely any other GI complaints.  His last colonoscopy was in January 2012 at which time he was found to have a normal study. His last EGD was in October 2014 at which time he was found to have a Schatzki's ring at the GE junction and a hiatal hernia. The study was otherwise normal.    Past Medical History:  Diagnosis Date  . Allergic rhinitis, cause unspecified   . BPH (benign prostatic hypertrophy)   . Essential hypertension, benign   . GERD (gastroesophageal reflux disease)   . Glaucoma   . Hiatal hernia 2014  . Hyperlipidemia   . Irritable bowel syndrome   . Lactose intolerance   . Other and unspecified hyperlipidemia   . Personal history of colonic polyps 03/04/1992   adenomatous polyp  . Pneumonia   . Reflux esophagitis   . Schatzki's ring 2014  . Sleep apnea   . Vitamin B12 deficiency    Past Surgical History:  Procedure Laterality Date  . CATARACT EXTRACTION Left   . EYE SURGERY Left   . NOSE SURGERY    . PROSTATE SURGERY      reports that he quit smoking about 33 years ago. His smoking use included Cigarettes. He smoked 1.00 pack per day. He has never used smokeless tobacco. He reports that he drinks alcohol. He reports that he does not use drugs. family history is  not on file. Allergies  Allergen Reactions  . Lactose Intolerance (Gi)       Outpatient Encounter Prescriptions as of 02/09/2017  Medication Sig  . clidinium-chlordiazePOXIDE (LIBRAX) 5-2.5 MG per capsule Take 1 capsule 1-2 times daily as needed.  . furosemide (LASIX) 20 MG tablet Take 1/2 of your 40 mg tablets daily at 10 am (Patient taking differently: Take 40 mg by mouth daily. Take 1/2 of your 40 mg tablets daily at 10 am)  . isosorbide-hydrALAZINE (BIDIL) 20-37.5 MG per tablet Take 1 tablet by mouth 3 (three) times daily.  [name] losartan (COZAAR) 25 MG tablet Take 1/2 tab of your 50 mg tablets daily  . metoprolol (LOPRESSOR) 50 MG tablet Take 1 tablet (50 mg total) by mouth 3 (three) times daily.  . potassium chloride SA (K-DUR,KLOR-CON) 20 MEQ tablet Take 20 mEq by mouth daily.  . Rivaroxaban (XARELTO) 15 MG TABS tablet Take 15 mg by mouth daily.  . [DISCONTINUED] amoxicillin-clavulanate (AUGMENTIN) 875-125 MG tablet Take 1 tablet by mouth 2 (two) times daily.  . [DISCONTINUED] azithromycin (ZITHROMAX) 250 MG tablet Take daily for 6 days  . [DISCONTINUED] digoxin (LANOXIN) 0.125 MG tablet Take 1 tablet (0.125 mg total) by mouth daily.   Facility-Administered Encounter Medications as of 02/09/2017  Medication  . cyanocobalamin ((VITAMIN B-12)) injection 1,000 mcg  REVIEW OF SYSTEMS  : All other systems reviewed and negative except where noted in the History of Present Illness.   PHYSICAL EXAM: BP 136/72   Pulse 78   Ht 6\' 1"  (1.854 m)   Wt 210 lb (95.3 kg)   BMI 27.71 kg/m  General: Well developed black male in no acute distress Head: Normocephalic and atraumatic Eyes:  Sclerae anicteric, conjunctiva pink. Ears: Normal auditory acuity Lungs: Clear throughout to auscultation; no increased WOB. Heart: Irregularly irregular. Abdomen: Soft, non-distended.  Normal bowel sounds.  Mild RUQ TTP. Musculoskeletal: Symmetrical with no gross deformities  Skin: No lesions on visible  extremities Extremities:  3+ pitting edema in B/L LE's Neurological: Alert oriented x 4, grossly non-focal Psychological:  Alert and cooperative. Normal mood and affect  ASSESSMENT AND PLAN: -83 year old male with complaints of intermittent RUQ abdominal pain/discomfort:  No other associated complaints.  Describes it as a "knot" that "quivers".  ? Gallbladder vs muscle spasm.  Will check abdominal ultrasound.   CC:  Stephens Shire, MD  Addendum: Reviewed and agree with initial management. Further recommendations pending ultrasound results. Many cross-sectional imaging or consideration of endoscopy based on ultrasound results and clinical course Jerene Bears, MD

## 2017-02-09 NOTE — Patient Instructions (Signed)
You have been scheduled for an abdominal ultrasound at [facility] (1st floor of hospital) on 02/14/2017 at 1130. Please arrive 15 minutes prior to your appointment for registration. Make certain not to have anything to eat or drink 6 hours prior to your appointment. Should you need to reschedule your appointment, please contact radiology at [phone]. This test typically takes about 30 minutes to perform.  Normal BMI (Body Mass Index- based on height and weight) is between 23 and 30. Your Body mass index is 27.71 kg/m.Please consider follow up  regarding your BMI with your Primary Care Provider.   Thank you

## 2017-02-11 ENCOUNTER — Ambulatory Visit (HOSPITAL_COMMUNITY): Payer: PPO

## 2017-02-14 ENCOUNTER — Ambulatory Visit (HOSPITAL_COMMUNITY)
Admission: RE | Admit: 2017-02-14 | Discharge: 2017-02-14 | Disposition: A | Discharge status: Home / Self Care | Payer: PPO | Source: Ambulatory Visit | Attending: Gastroenterology | Admitting: Gastroenterology

## 2017-02-14 DIAGNOSIS — R93422 Abnormal radiologic findings on diagnostic imaging of left kidney: Secondary | ICD-10-CM | POA: Insufficient documentation

## 2017-02-14 DIAGNOSIS — N281 Cyst of kidney, acquired: Secondary | ICD-10-CM | POA: Insufficient documentation

## 2017-02-14 DIAGNOSIS — K824 Cholesterolosis of gallbladder: Secondary | ICD-10-CM | POA: Diagnosis not present

## 2017-02-14 DIAGNOSIS — K7689 Other specified diseases of liver: Secondary | ICD-10-CM | POA: Insufficient documentation

## 2017-02-14 DIAGNOSIS — R93421 Abnormal radiologic findings on diagnostic imaging of right kidney: Secondary | ICD-10-CM | POA: Diagnosis not present

## 2017-02-14 DIAGNOSIS — R1011 Right upper quadrant pain: Secondary | ICD-10-CM | POA: Insufficient documentation

## 2017-03-10 DIAGNOSIS — N529 Male erectile dysfunction, unspecified: Secondary | ICD-10-CM | POA: Diagnosis not present

## 2017-04-06 ENCOUNTER — Ambulatory Visit: Payer: PPO | Admitting: Podiatry

## 2017-04-11 DIAGNOSIS — I10 Essential (primary) hypertension: Secondary | ICD-10-CM | POA: Diagnosis not present

## 2017-04-11 DIAGNOSIS — I4891 Unspecified atrial fibrillation: Secondary | ICD-10-CM | POA: Diagnosis not present

## 2017-04-11 DIAGNOSIS — I5022 Chronic systolic (congestive) heart failure: Secondary | ICD-10-CM | POA: Diagnosis not present

## 2017-04-11 DIAGNOSIS — R600 Localized edema: Secondary | ICD-10-CM | POA: Diagnosis not present

## 2017-04-20 DIAGNOSIS — H401133 Primary open-angle glaucoma, bilateral, severe stage: Secondary | ICD-10-CM | POA: Diagnosis not present

## 2017-04-20 DIAGNOSIS — H539 Unspecified visual disturbance: Secondary | ICD-10-CM | POA: Diagnosis not present

## 2017-04-20 DIAGNOSIS — H2511 Age-related nuclear cataract, right eye: Secondary | ICD-10-CM | POA: Diagnosis not present

## 2017-04-25 ENCOUNTER — Ambulatory Visit (INDEPENDENT_AMBULATORY_CARE_PROVIDER_SITE_OTHER): Payer: PPO | Admitting: Podiatry

## 2017-04-25 DIAGNOSIS — M79676 Pain in unspecified toe(s): Secondary | ICD-10-CM | POA: Diagnosis not present

## 2017-04-25 DIAGNOSIS — B351 Tinea unguium: Secondary | ICD-10-CM | POA: Diagnosis not present

## 2017-04-28 NOTE — Progress Notes (Signed)
   SUBJECTIVE Patient  presents to office today complaining of elongated, thickened nails. Pain while ambulating in shoes. Patient is unable to trim their own nails. Patient also complains of a bunion to the right foot and wants to discuss treatment options.  OBJECTIVE General Patient is awake, alert, and oriented x 3 and in no acute distress. Derm Skin is dry and supple bilateral. Negative open lesions or macerations. Remaining integument unremarkable. Nails are tender, long, thickened and dystrophic with subungual debris, consistent with onychomycosis, 1-5 bilateral. No signs of infection noted. Vasc  DP and PT pedal pulses palpable bilaterally. Temperature gradient within normal limits.  Neuro Epicritic and protective threshold sensation diminished bilaterally.  Musculoskeletal Exam No symptomatic pedal deformities noted bilateral. Muscular strength within normal limits.  ASSESSMENT 1. Onychodystrophic nails 1-5 bilateral with hyperkeratosis of nails.  2. Onychomycosis of nail due to dermatophyte bilateral 3. Pain in foot bilateral  PLAN OF CARE 1. Patient evaluated today.  2. Instructed to maintain good pedal hygiene and foot care.  3. Mechanical debridement of nails 1-5 bilaterally performed using a nail nipper. Filed with dremel without incident.  4. Return to clinic in 3 mos.    Edrick Kins, DPM [facility]  Dr. Edrick Kins, [address]                                        [place], [place] [address]                Office [phone]  Fax [phone]

## 2017-05-02 ENCOUNTER — Encounter: Payer: Self-pay | Admitting: *Deleted

## 2017-05-05 ENCOUNTER — Ambulatory Visit: Payer: PPO | Admitting: Internal Medicine

## 2017-05-05 ENCOUNTER — Other Ambulatory Visit: Payer: Self-pay

## 2017-05-10 DIAGNOSIS — H3581 Retinal edema: Secondary | ICD-10-CM | POA: Diagnosis not present

## 2017-05-10 DIAGNOSIS — H11423 Conjunctival edema, bilateral: Secondary | ICD-10-CM | POA: Diagnosis not present

## 2017-05-10 DIAGNOSIS — H401133 Primary open-angle glaucoma, bilateral, severe stage: Secondary | ICD-10-CM | POA: Diagnosis not present

## 2017-05-10 DIAGNOSIS — H2511 Age-related nuclear cataract, right eye: Secondary | ICD-10-CM | POA: Diagnosis not present

## 2017-05-10 DIAGNOSIS — Z9842 Cataract extraction status, left eye: Secondary | ICD-10-CM | POA: Diagnosis not present

## 2017-05-10 DIAGNOSIS — Z961 Presence of intraocular lens: Secondary | ICD-10-CM | POA: Diagnosis not present

## 2017-05-10 DIAGNOSIS — H18413 Arcus senilis, bilateral: Secondary | ICD-10-CM | POA: Diagnosis not present

## 2017-05-10 DIAGNOSIS — H11153 Pinguecula, bilateral: Secondary | ICD-10-CM | POA: Diagnosis not present

## 2017-05-10 DIAGNOSIS — H25011 Cortical age-related cataract, right eye: Secondary | ICD-10-CM | POA: Diagnosis not present

## 2017-05-10 DIAGNOSIS — H25041 Posterior subcapsular polar age-related cataract, right eye: Secondary | ICD-10-CM | POA: Diagnosis not present

## 2017-05-10 DIAGNOSIS — H04123 Dry eye syndrome of bilateral lacrimal glands: Secondary | ICD-10-CM | POA: Diagnosis not present

## 2017-07-19 ENCOUNTER — Ambulatory Visit: Payer: PPO | Admitting: Internal Medicine

## 2017-07-20 ENCOUNTER — Ambulatory Visit (INDEPENDENT_AMBULATORY_CARE_PROVIDER_SITE_OTHER): Payer: PPO | Admitting: Nurse Practitioner

## 2017-07-20 ENCOUNTER — Other Ambulatory Visit (INDEPENDENT_AMBULATORY_CARE_PROVIDER_SITE_OTHER): Payer: PPO

## 2017-07-20 ENCOUNTER — Encounter: Payer: Self-pay | Admitting: Nurse Practitioner

## 2017-07-20 ENCOUNTER — Encounter (INDEPENDENT_AMBULATORY_CARE_PROVIDER_SITE_OTHER): Payer: Self-pay

## 2017-07-20 VITALS — BP 120/68 | HR 7 | Ht 73.0 in | Wt 207.0 lb

## 2017-07-20 DIAGNOSIS — R1012 Left upper quadrant pain: Secondary | ICD-10-CM

## 2017-07-20 DIAGNOSIS — G8929 Other chronic pain: Secondary | ICD-10-CM

## 2017-07-20 DIAGNOSIS — R933 Abnormal findings on diagnostic imaging of other parts of digestive tract: Secondary | ICD-10-CM

## 2017-07-20 DIAGNOSIS — R1030 Lower abdominal pain, unspecified: Secondary | ICD-10-CM | POA: Diagnosis not present

## 2017-07-20 LAB — COMPREHENSIVE METABOLIC PANEL
ALK PHOS: 61 U/L (ref 39–117)
ALT: 7 U/L (ref 0–53)
AST: 17 U/L (ref 0–37)
Albumin: 4 g/dL (ref 3.5–5.2)
BILIRUBIN TOTAL: 1.8 mg/dL — AB (ref 0.2–1.2)
BUN: 16 mg/dL (ref 6–23)
CO2: 36 mEq/L — ABNORMAL HIGH (ref 19–32)
Calcium: 9.1 mg/dL (ref 8.4–10.5)
Chloride: 102 mEq/L (ref 96–112)
Creatinine, Ser: 1.26 mg/dL (ref 0.40–1.50)
GFR: 70.58 mL/min (ref >60.00)
GLUCOSE: 89 mg/dL (ref 70–99)
Potassium: 3.1 mEq/L — ABNORMAL LOW (ref 3.5–5.1)
SODIUM: 144 meq/L (ref 135–145)
TOTAL PROTEIN: 6.5 g/dL (ref 6.0–8.3)

## 2017-07-20 LAB — CBC WITH DIFFERENTIAL/PLATELET
BASOS ABS: 0 10*3/uL (ref 0.0–0.1)
Basophils Relative: 0.9 % (ref 0.0–3.0)
EOS PCT: 1.5 % (ref 0.0–5.0)
Eosinophils Absolute: 0 10*3/uL (ref 0.0–0.7)
HCT: 43.4 % (ref 39.0–52.0)
Hemoglobin: 14.1 g/dL (ref 13.0–17.0)
LYMPHS PCT: 36.1 % (ref 12.0–46.0)
Lymphs Abs: 1.1 10*3/uL (ref 0.7–4.0)
MCHC: 32.4 g/dL (ref 30.0–36.0)
MCV: 94.8 fl (ref 78.0–100.0)
Monocytes Absolute: 0.4 10*3/uL (ref 0.1–1.0)
Monocytes Relative: 14.3 % — ABNORMAL HIGH (ref 3.0–12.0)
Neutro Abs: 1.4 10*3/uL (ref 1.4–7.7)
Neutrophils Relative %: 47.2 % (ref 43.0–77.0)
Platelets: 115 10*3/uL — ABNORMAL LOW (ref 150.0–400.0)
RBC: 4.58 Mil/uL (ref 4.22–5.81)
RDW: 13.1 % (ref 11.5–15.5)
WBC: 2.9 10*3/uL — AB (ref 4.0–10.5)

## 2017-07-20 LAB — LIPASE: Lipase: 25 U/L (ref 11.0–59.0)

## 2017-07-20 MED ORDER — RANITIDINE HCL 150 MG PO TABS: 150.0000 mg | ORAL_TABLET | ORAL | 3 refills | Status: DC

## 2017-07-20 NOTE — Patient Instructions (Signed)
If you are age 81 or older, your body mass index should be between 23-30. Your Body mass index is 27.31 kg/m. If this is out of the aforementioned range listed, please consider follow up with your Primary Care Provider.  If you are age 33 or younger, your body mass index should be between 19-25. Your Body mass index is 27.31 kg/m. If this is out of the aformentioned range listed, please consider follow up with your Primary Care Provider.   We have sent the following medications to your pharmacy for you to pick up at your convenience: Zantac 150 mg  Your physician has requested that you go to the basement for the following lab work before leaving today: CBC w/Diff CMET Lipase  Call with an update in 3 weeks.   Follow up as needed.  Thank you for choosing me and [facility].   Tye Savoy, NP

## 2017-07-20 NOTE — Progress Notes (Addendum)
HPI: Patient is an 83 yo old male with AFib, on xarelto. He was previously followed by Dr. Sharlett Iles for IBS and GERD, now under the care of Dr. Hilarie Fredrickson. I saw him in March 2015 for bloating and LUQ pain  He was in seen here in April of this year for right sided abdominal pain. Ultrasound demonstrated mild gallbladder wall thickening,  a right lobe hepatic cyst which had increased in size measuring  2.4 x 2 x 1.8 cm. And bilateral renal cyst up to 1.9 cm in size. He felt better with a follow up phone call so no further workup was done.    Mr. Duffey is back with LUQ pain. He says that he has never had RUQ pain, always left sided. The pain is intermittent and he can't really relate the pain to any certain foods, at least not consistently.  Says he has stomach problems all his life and it has been the same LUQ pain the whole time. The pain doesn't radiate through to the back. It is unrelated to BMs which he says are fine. No associated vomiting. He takes Pepto after meals sometimes. He drinks Castol oil which helps the discomfort and doesn't have a laxative effect unless he takes two doses. No weight loss, he is up 7 pounds since last October. Says no one has ever figured the left sided pain out and he just figures it is something he has to live with. No involuntary weight loss, in fact he is up 7 pounds    Past Medical History:  Diagnosis Date  . Allergic rhinitis, cause unspecified   . BPH (benign prostatic hypertrophy)   . Essential hypertension, benign   . GERD (gastroesophageal reflux disease)   . Glaucoma   . Hepatic cyst   . Hiatal hernia 2014  . Hyperlipidemia   . Irritable bowel syndrome   . Lactose intolerance   . Other and unspecified hyperlipidemia   . Personal history of colonic polyps 03/04/1992   adenomatous polyp  . Pneumonia   . Reflux esophagitis   . Schatzki's ring 2014  . Sleep apnea   . Vitamin B12 deficiency     Patient's surgical history, family medical history,  social history, medications and allergies were all reviewed in Epic    Physical Exam: BP 120/68   Pulse (!) 7   Ht 6\' 1"  (1.854 m)   Wt 207 lb (93.9 kg)   BMI 27.31 kg/m   GENERAL: well developed black male in NAD PSYCH: :Pleasant, cooperative, normal affect EENT:  conjunctiva pink, mucous membranes moist, neck supple without masses CARDIAC:  Regular rate, irregular rhythm, 2+ peripheral edema PULM: Normal respiratory effort, lungs CTA bilaterally, no wheezing ABDOMEN:  Protuberant but soft, nontender,  normal bowel sounds. Localized mild tenderness LUQ. In RUQ just lateral to xiphoid there is an area of fullness but not definable mass SKIN:  turgor, no lesions seen Musculoskeletal:  Normal muscle tone, normal strength NEURO: Alert and oriented x 3, no focal neurologic deficits   ASSESSMENT and PLAN:  1. Pleasant  83 yo male with chronic (20 + years) intermittent left sided abdominal pain. He is back with the same LUQ pain. Doesn't expect answers just wanted to "be checked". He looks okay, no involuntary weight loss. No nausea / vomiting.  Not taking NSAIDs. BMs okay.  - No recent labs in EPIC. Will check basic labs today including Cbc, cmet, lipase -If this is truly same pain for 20+ years as Dr.  Sharlett Iles mentioned in previous notes then I'm sure patient has been several meds through the years. I don't see that is presently taking any acid blocking medications. He is agreeable to a trial of Zantac . -He will call in a few weeks with a condition update.  -Reassurance given   2. Hepatic cyst, enlarging on last U/S in April but still only 2.4 cm at largest point.  -Incidental finding and unrelated to his chronic left upper quadrant pain but still may need follow imaging in the future.  3. Colon cancer screening. He had a complete colonoscopy with an excellent bowel prep in 2012. Given age, no need for future screenings.     I spent 25 minutes of face-to-face time with the patient.  Greater than 50% of the time was spent counseling and coordinating care. Questions answered   Tye Savoy , NP 07/20/2017, 1:39 PM   Addendum: Reviewed and agree with initial management. Pyrtle, Lajuan Lines, MD

## 2017-07-26 ENCOUNTER — Ambulatory Visit: Payer: PPO | Admitting: Podiatry

## 2017-08-02 DIAGNOSIS — R600 Localized edema: Secondary | ICD-10-CM | POA: Diagnosis not present

## 2017-08-02 DIAGNOSIS — I5022 Chronic systolic (congestive) heart failure: Secondary | ICD-10-CM | POA: Diagnosis not present

## 2017-08-02 DIAGNOSIS — I482 Chronic atrial fibrillation: Secondary | ICD-10-CM | POA: Diagnosis not present

## 2017-08-02 DIAGNOSIS — I10 Essential (primary) hypertension: Secondary | ICD-10-CM | POA: Diagnosis not present

## 2017-08-08 DIAGNOSIS — I10 Essential (primary) hypertension: Secondary | ICD-10-CM | POA: Diagnosis not present

## 2017-08-08 DIAGNOSIS — I5022 Chronic systolic (congestive) heart failure: Secondary | ICD-10-CM | POA: Diagnosis not present

## 2017-08-08 DIAGNOSIS — I4891 Unspecified atrial fibrillation: Secondary | ICD-10-CM | POA: Diagnosis not present

## 2017-08-17 DIAGNOSIS — H401133 Primary open-angle glaucoma, bilateral, severe stage: Secondary | ICD-10-CM | POA: Diagnosis not present

## 2017-08-17 DIAGNOSIS — H539 Unspecified visual disturbance: Secondary | ICD-10-CM | POA: Diagnosis not present

## 2017-08-23 DIAGNOSIS — I482 Chronic atrial fibrillation: Secondary | ICD-10-CM | POA: Diagnosis not present

## 2017-08-23 DIAGNOSIS — R600 Localized edema: Secondary | ICD-10-CM | POA: Diagnosis not present

## 2017-08-23 DIAGNOSIS — I10 Essential (primary) hypertension: Secondary | ICD-10-CM | POA: Diagnosis not present

## 2017-08-23 DIAGNOSIS — I5022 Chronic systolic (congestive) heart failure: Secondary | ICD-10-CM | POA: Diagnosis not present

## 2017-08-31 ENCOUNTER — Encounter: Payer: Self-pay | Admitting: Physician Assistant

## 2017-08-31 ENCOUNTER — Ambulatory Visit (INDEPENDENT_AMBULATORY_CARE_PROVIDER_SITE_OTHER): Payer: PPO | Admitting: Physician Assistant

## 2017-08-31 VITALS — BP 128/82 | HR 72 | Ht 73.0 in | Wt 217.2 lb

## 2017-08-31 DIAGNOSIS — K580 Irritable bowel syndrome with diarrhea: Secondary | ICD-10-CM

## 2017-08-31 MED ORDER — HYOSCYAMINE SULFATE SL 0.125 MG SL SUBL: SUBLINGUAL_TABLET | SUBLINGUAL | 3 refills | Status: DC

## 2017-08-31 NOTE — Patient Instructions (Addendum)
We have sent the following medications to your pharmacy for you to pick up at your convenience:  [address] and [employer].  1. Levsin SL  Stot the Zantac.  Follow up with Nicoletta Ba PA or Dr. Hilarie Fredrickson as needed.

## 2017-08-31 NOTE — Progress Notes (Addendum)
Subjective:    Patient ID: Ronald [name], male    DOB: 1934-09-12, 83 y.o.   MRN: [id number]  HPI Ronald [name] is a pleasant 83 year old African-American male, known to Dr. Hilarie Fredrickson. He has diagnosis of IBS, GERD, atrial fibrillation for which she is on Xarelto, hypertension and congestive heart failure. He was last seen in the office about a month ago by Tye Savoy NP and at that time was complaining of left upper quadrant abdominal pain.. This is been a long-standing complaint. He was given a trial of ranitidine 150 mg by mouth twice daily. He comes in today with complaints of intermittent episodes of urgency and diarrhea. He feels that the Zantac made these worse and did not notice any benefit from the Zantac He's particularly concerned about an episode that happened within the past couple of weeks on a Sunday, he had eaten breakfast at a restaurant, was on his way to church and developed a lot of abdominal rumbling some distention and urgency for bowel movements. He was speeding and pulled over by a police officer who let him go after he explained the problem.[name] He is not a good historian but says he may get a similar type episode about once a week. He is unable to be very specific about any foods that aggravate the symptoms though perhaps heavier greasy foods. In between these episodes he will tend towards soft stools but just has one bowel movement per day. He denies any abdominal discomfort and says that his appetite is good. Generally when these episodes are coming on he will develop some abdominal distention and feels a hard area above his umbilicus, once he has diarrhea which may be 2-3 episodes includes his bowel all the symptoms resolve Denies lactose intolerance. He had been given Librax at one point several years ago but has not had any recently. Last colonoscopy 2012 per Dr. Sharlett Iles was unremarkable.  Review of Systems Pertinent positive and negative review of systems were noted in the  above HPI section.  All other review of systems was otherwise negative.  Outpatient Encounter Prescriptions as of 08/31/2017  Medication Sig  . clidinium-chlordiazePOXIDE (LIBRAX) 5-2.5 MG per capsule Take 1 capsule 1-2 times daily as needed.  . furosemide (LASIX) 20 MG tablet Take 1/2 of your 40 mg tablets daily at 10 am (Patient taking differently: Take 40 mg by mouth daily. Take 1/2 of your 40 mg tablets daily at 10 am)  . isosorbide-hydrALAZINE (BIDIL) 20-37.5 MG per tablet Take 1 tablet by mouth 3 (three) times daily.  [name] losartan (COZAAR) 25 MG tablet Take 1/2 tab of your 50 mg tablets daily  . metoprolol (LOPRESSOR) 50 MG tablet Take 1 tablet (50 mg total) by mouth 3 (three) times daily.  . potassium chloride SA (K-DUR,KLOR-CON) 20 MEQ tablet Take 20 mEq by mouth daily.  . ranitidine (ZANTAC) 150 MG tablet Take 1 tablet (150 mg total) by mouth every morning.  . Rivaroxaban (XARELTO) 15 MG TABS tablet Take 15 mg by mouth daily.  [name] Hyoscyamine Sulfate SL (LEVSIN/SL) 0.125 MG SUBL Dissolve 1 tablet on the tongue for stomach rumbling/diarrhea.   Facility-Administered Encounter Medications as of 08/31/2017  Medication  . cyanocobalamin ((VITAMIN B-12)) injection 1,000 mcg   Allergies  Allergen Reactions  . Lactose Intolerance (Gi)    Patient Active Problem List   Diagnosis Date Noted  . Right upper quadrant abdominal pain 02/09/2017  . Systolic CHF, acute ([place]) 03/26/2014  . Community acquired pneumonia 03/24/2014  . Acute diastolic  CHF (congestive heart failure) ([place]) 03/24/2014  . Atrial fibrillation with RVR ([place]) 03/24/2014  . Bloating 01/16/2014  . Weight gain 01/16/2014  . IBS (irritable bowel syndrome) 01/27/2012  . GERD (gastroesophageal reflux disease) 01/27/2012  . B12 deficiency 01/27/2012  . VITAMIN B12 DEFICIENCY 11/23/2010  . COLONIC POLYPS, HX OF 11/20/2010  . HYPERLIPIDEMIA 11/19/2010  . HYPERTENSION, BENIGN 11/19/2010  . ALLERGIC RHINITIS 11/19/2010  . REFLUX  ESOPHAGITIS 11/19/2010  . IRRITABLE BOWEL SYNDROME 11/19/2010   Social History   Social History  . Marital status: Married    Spouse name: N/A  . Number of children: N/A  . Years of education: N/A   Occupational History  . Not on file.   Social History Main Topics  . Smoking status: Former Smoker    Packs/day: 1.00    Types: Cigarettes    Quit date: 11/02/1983  . Smokeless tobacco: Never Used  . Alcohol use Yes     Comment: 2oz wine occassionally per day  . Drug use: No  . Sexual activity: Not on file   Other Topics Concern  . Not on file   Social History Narrative  . No narrative on file    Mr. Leugers's family history is not on file.      Objective:    Vitals:   08/31/17 1037  BP: 128/82  Pulse: 72    Physical Exam ; well-developed elderly African-American male in no acute distress, pleasant blood pressure 128/82 pulse 72, height 6 foot 1, weight 217, BMI 28.6. HEENT; nontraumatic normocephalic EOMI PERRLA sclera anicteric, Cardiovascular; regular rate and rhythm with S1-S2 no murmur or gallop, Pulmonary; clear bilaterally, Abdomen ;soft, nontender nondistended he does have a supraumbilical ventral hernia, nontender, liver palpable at right costal margin, bowel sounds are present, Rectal ;exam not done, Extremities; no clubbing cyanosis or edema skin warm and dry, Neuropsych; mood and affect appropriate       Assessment & Plan:   #20 83 year old African-American male with periodic episodes of urgency and diarrhea, in patient with prior diagnosis of IBS. I think his symptoms are consistent with IBS D. He may have an episode of diarrhea once a week Negative colonoscopy 2012 #2 atrial fibrillation-on Xarelto #3 congestive heart failure diastolic #4 hypertension #5 history of GERD denies any ongoing issues with heartburn or indigestion  Plan: Stop Zantac Start trial of Levsin sublingual when necessary at onset of any urgency /abdominal rumbling/diarrhea. Have  also suggested he take the Levsin sublingual prior to eating out at restaurants as well. He will follow-up on an as-needed basis.  Amy S Esterwood PA-C 08/31/2017   Cc: No ref. provider found   Addendum: Reviewed and agree with management. Pyrtle, Lajuan Lines, MD

## 2017-10-04 DIAGNOSIS — I5022 Chronic systolic (congestive) heart failure: Secondary | ICD-10-CM | POA: Diagnosis not present

## 2017-10-04 DIAGNOSIS — I482 Chronic atrial fibrillation: Secondary | ICD-10-CM | POA: Diagnosis not present

## 2017-10-14 ENCOUNTER — Inpatient Hospital Stay (HOSPITAL_COMMUNITY)
Admission: AD | Admit: 2017-10-14 | Discharge: 2017-10-18 | DRG: 286 | Disposition: A | Discharge status: Home / Self Care | Payer: PPO | Source: Ambulatory Visit | Attending: Cardiology | Admitting: Cardiology

## 2017-10-14 ENCOUNTER — Other Ambulatory Visit: Payer: Self-pay

## 2017-10-14 ENCOUNTER — Encounter (HOSPITAL_COMMUNITY): Payer: Self-pay | Admitting: General Practice

## 2017-10-14 DIAGNOSIS — M7989 Other specified soft tissue disorders: Secondary | ICD-10-CM | POA: Diagnosis present

## 2017-10-14 DIAGNOSIS — I481 Persistent atrial fibrillation: Secondary | ICD-10-CM | POA: Diagnosis not present

## 2017-10-14 DIAGNOSIS — Z7901 Long term (current) use of anticoagulants: Secondary | ICD-10-CM | POA: Diagnosis not present

## 2017-10-14 DIAGNOSIS — N183 Chronic kidney disease, stage 3 (moderate): Secondary | ICD-10-CM | POA: Diagnosis present

## 2017-10-14 DIAGNOSIS — R0602 Shortness of breath: Secondary | ICD-10-CM | POA: Diagnosis not present

## 2017-10-14 DIAGNOSIS — Z79899 Other long term (current) drug therapy: Secondary | ICD-10-CM | POA: Diagnosis not present

## 2017-10-14 DIAGNOSIS — Z86010 Personal history of colonic polyps: Secondary | ICD-10-CM

## 2017-10-14 DIAGNOSIS — Z87891 Personal history of nicotine dependence: Secondary | ICD-10-CM | POA: Diagnosis not present

## 2017-10-14 DIAGNOSIS — I5043 Acute on chronic combined systolic (congestive) and diastolic (congestive) heart failure: Secondary | ICD-10-CM | POA: Diagnosis present

## 2017-10-14 DIAGNOSIS — I130 Hypertensive heart and chronic kidney disease with heart failure and stage 1 through stage 4 chronic kidney disease, or unspecified chronic kidney disease: Secondary | ICD-10-CM | POA: Diagnosis not present

## 2017-10-14 DIAGNOSIS — K219 Gastro-esophageal reflux disease without esophagitis: Secondary | ICD-10-CM | POA: Diagnosis present

## 2017-10-14 DIAGNOSIS — H409 Unspecified glaucoma: Secondary | ICD-10-CM | POA: Diagnosis not present

## 2017-10-14 DIAGNOSIS — E739 Lactose intolerance, unspecified: Secondary | ICD-10-CM | POA: Diagnosis not present

## 2017-10-14 DIAGNOSIS — I493 Ventricular premature depolarization: Secondary | ICD-10-CM | POA: Diagnosis not present

## 2017-10-14 DIAGNOSIS — I5022 Chronic systolic (congestive) heart failure: Secondary | ICD-10-CM

## 2017-10-14 DIAGNOSIS — R1900 Intra-abdominal and pelvic swelling, mass and lump, unspecified site: Secondary | ICD-10-CM | POA: Diagnosis present

## 2017-10-14 DIAGNOSIS — R350 Frequency of micturition: Secondary | ICD-10-CM | POA: Diagnosis not present

## 2017-10-14 DIAGNOSIS — I272 Pulmonary hypertension, unspecified: Secondary | ICD-10-CM | POA: Diagnosis present

## 2017-10-14 DIAGNOSIS — G4730 Sleep apnea, unspecified: Secondary | ICD-10-CM | POA: Diagnosis not present

## 2017-10-14 DIAGNOSIS — I429 Cardiomyopathy, unspecified: Secondary | ICD-10-CM | POA: Diagnosis not present

## 2017-10-14 DIAGNOSIS — E785 Hyperlipidemia, unspecified: Secondary | ICD-10-CM | POA: Diagnosis not present

## 2017-10-14 DIAGNOSIS — I2510 Atherosclerotic heart disease of native coronary artery without angina pectoris: Secondary | ICD-10-CM | POA: Diagnosis not present

## 2017-10-14 DIAGNOSIS — I5023 Acute on chronic systolic (congestive) heart failure: Secondary | ICD-10-CM

## 2017-10-14 HISTORY — DX: Personal history of other diseases of the musculoskeletal system and connective tissue: Z87.39

## 2017-10-14 LAB — COMPREHENSIVE METABOLIC PANEL
ALBUMIN: 3.3 g/dL — AB (ref 3.5–5.0)
ALT: 10 U/L — AB (ref 17–63)
AST: 24 U/L (ref 15–41)
Alkaline Phosphatase: 64 U/L (ref 38–126)
Anion gap: 7 (ref 5–15)
BILIRUBIN TOTAL: 1.8 mg/dL — AB (ref 0.3–1.2)
BUN: 16 mg/dL (ref 6–20)
CHLORIDE: 107 mmol/L (ref 101–111)
CO2: 28 mmol/L (ref 22–32)
CREATININE: 1.29 mg/dL — AB (ref 0.61–1.24)
Calcium: 8.6 mg/dL — ABNORMAL LOW (ref 8.9–10.3)
GFR calc Af Amer: 58 mL/min — ABNORMAL LOW (ref >60)
GFR, EST NON AFRICAN AMERICAN: 50 mL/min — AB (ref >60)
GLUCOSE: 77 mg/dL (ref 65–99)
Potassium: 3.4 mmol/L — ABNORMAL LOW (ref 3.5–5.1)
Sodium: 142 mmol/L (ref 135–145)
Total Protein: 5.8 g/dL — ABNORMAL LOW (ref 6.5–8.1)

## 2017-10-14 LAB — CBC WITH DIFFERENTIAL/PLATELET
BASOS ABS: 0 10*3/uL (ref 0.0–0.1)
Basophils Relative: 0 %
EOS PCT: 2 %
Eosinophils Absolute: 0.1 10*3/uL (ref 0.0–0.7)
HEMATOCRIT: 45.7 % (ref 39.0–52.0)
Hemoglobin: 14.5 g/dL (ref 13.0–17.0)
LYMPHS PCT: 37 %
Lymphs Abs: 1.1 10*3/uL (ref 0.7–4.0)
MCH: 30.7 pg (ref 26.0–34.0)
MCHC: 31.7 g/dL (ref 30.0–36.0)
MCV: 96.8 fL (ref 78.0–100.0)
MONO ABS: 0.3 10*3/uL (ref 0.1–1.0)
MONOS PCT: 9 %
Neutro Abs: 1.6 10*3/uL — ABNORMAL LOW (ref 1.7–7.7)
Neutrophils Relative %: 53 %
PLATELETS: 98 10*3/uL — AB (ref 150–400)
RBC: 4.72 MIL/uL (ref 4.22–5.81)
RDW: 13.9 % (ref 11.5–15.5)
WBC: 3.1 10*3/uL — ABNORMAL LOW (ref 4.0–10.5)

## 2017-10-14 LAB — BRAIN NATRIURETIC PEPTIDE: B NATRIURETIC PEPTIDE 5: 3827.8 pg/mL — AB (ref 0.0–100.0)

## 2017-10-14 MED ORDER — SACUBITRIL-VALSARTAN 97-103 MG PO TABS
1.0000 | ORAL_TABLET | Freq: Two times a day (BID) | ORAL | Status: DC
  Administered 2017-10-14 – 2017-10-18 (×8): 1 via ORAL
  Filled 2017-10-14 (×9): qty 1

## 2017-10-14 MED ORDER — SODIUM CHLORIDE 0.9% FLUSH
3.0000 mL | Freq: Two times a day (BID) | INTRAVENOUS | Status: DC
  Administered 2017-10-14 – 2017-10-16 (×5): 3 mL via INTRAVENOUS

## 2017-10-14 MED ORDER — FAMOTIDINE 20 MG PO TABS
10.0000 mg | ORAL_TABLET | Freq: Every day | ORAL | Status: DC
  Administered 2017-10-15 – 2017-10-18 (×4): 10 mg via ORAL
  Filled 2017-10-14 (×4): qty 1

## 2017-10-14 MED ORDER — ONDANSETRON HCL 4 MG/2ML IJ SOLN: 4.0000 mg | Freq: Four times a day (QID) | INTRAMUSCULAR | Status: DC | PRN

## 2017-10-14 MED ORDER — POTASSIUM CHLORIDE CRYS ER 20 MEQ PO TBCR
40.0000 meq | EXTENDED_RELEASE_TABLET | Freq: Once | ORAL | Status: AC
  Administered 2017-10-14: 40 meq via ORAL
  Filled 2017-10-14: qty 2

## 2017-10-14 MED ORDER — SODIUM CHLORIDE 0.9 % IV SOLN: 250.0000 mL | INTRAVENOUS | Status: DC | PRN

## 2017-10-14 MED ORDER — SODIUM CHLORIDE 0.9% FLUSH: 3.0000 mL | INTRAVENOUS | Status: DC | PRN

## 2017-10-14 MED ORDER — METOPROLOL TARTRATE 100 MG PO TABS
100.0000 mg | ORAL_TABLET | Freq: Two times a day (BID) | ORAL | Status: DC
  Administered 2017-10-14 – 2017-10-15 (×3): 100 mg via ORAL
  Filled 2017-10-14 (×3): qty 1

## 2017-10-14 MED ORDER — ACETAMINOPHEN 325 MG PO TABS: 650.0000 mg | ORAL_TABLET | ORAL | Status: DC | PRN

## 2017-10-14 MED ORDER — SPIRONOLACTONE 25 MG PO TABS
50.0000 mg | ORAL_TABLET | Freq: Every day | ORAL | Status: DC
  Administered 2017-10-15 – 2017-10-18 (×4): 50 mg via ORAL
  Filled 2017-10-14 (×4): qty 2

## 2017-10-14 MED ORDER — FUROSEMIDE 10 MG/ML IJ SOLN
40.0000 mg | Freq: Four times a day (QID) | INTRAMUSCULAR | Status: DC
  Administered 2017-10-14 – 2017-10-15 (×4): 40 mg via INTRAVENOUS
  Filled 2017-10-14 (×4): qty 4

## 2017-10-14 MED ORDER — RIVAROXABAN 20 MG PO TABS
20.0000 mg | ORAL_TABLET | Freq: Every day | ORAL | Status: DC
  Administered 2017-10-14: 20 mg via ORAL
  Filled 2017-10-14: qty 1

## 2017-10-14 NOTE — Progress Notes (Signed)
Patient admitted as direct admit to room 3E26.  His wife is at bedside.  Orders released and patient and wife oriented to room and both updated on plan of care.

## 2017-10-14 NOTE — Plan of Care (Signed)
  Education: Knowledge of General Education information will improve 10/14/2017 1405 - Completed/Met by Alonna Buckler, RN

## 2017-10-14 NOTE — H&P (Signed)
Ronald [name] is an 83 y.o. male.   Chief Complaint: Leg edema  HPI:   83 year old African-American male with chronic systolic and diastolic heart failure, EF 20%, persistent atrial fibrillation, hyperlipidemia, presented to the office earlier today with worsening leg swelling and abdominal swelling.  He was found to have significant for edema and thought to be in heart failure and station, was admitted to the hospital.  He has been on excellent medical therapy including Entresto 97-1 03 mg twice daily, spironolactone 50 mg once daily, metoprolol tartrate 100 mg twice daily, Xarelto 20 mg daily, Lasix 40 mg p.o. once daily. Unsure if he is taking sildenafil.  For the last few weeks, he has had worsening leg swelling, orthopnea and PND. He was thus recommended hospital admission for IV diuresis by Dr. Woody Seller, his primary cardiologist, when seen in the office.   Lexiscan myoview stress test 08/08/2017: 1. Low risk of hemodynamically significant coronary artery disease. Prognostically, this is a high risk study. The LV is dilated both in rest and stress images with severe global hypokinesis. 2. Rest and stress EKG show A. FIb and occasional PVC. Non diagnostic due to pharmacologic stress. 3. Fixed perfusion defect due to diaphragmatic attenuation. 4. The gated study showed the ejection fraction was <10%.  Echocardiogram 10/04/2017: 1. Left ventricle cavity is normal in size. Moderate concentric hypertrophy of the left ventricle. Severe decrease in global wall motion. Systolic and diastolic septal flattening, suggestive of right sided pressure and volume overload. Accurate assessment of systolic function limited due to atrial fibrilation and severity of mitral regurgitation. Elevated left atrial pressure. Spontaneous echo contrast, and low LVOT VTI suggestive of low flow state. No LV thrombus seen, however sensitivity limited without contrast use. Calculated LVEF 15-20%. 2. Severe biatrial  dilatation. 3. Moderate RV dilatation with relatively preserved systolic function. . 4. Mild (Grade I) aortic and pulmonic regurgitation. 5. Moderate (Grade III) mitral regurgitation. 6. Moderate to severe tricuspid regurgitation. RA-RV pressure gradient 26 mmHg. PA systolic pressure underestimated due to elevated right atrial pressure, but measures at least 41 mmHg. 7. No significant change compared to prior study dated 02/03/2017.  Past Medical History:  Diagnosis Date  . Allergic rhinitis, cause unspecified   . BPH (benign prostatic hypertrophy)   . Essential hypertension, benign   . GERD (gastroesophageal reflux disease)   . Glaucoma   . Hepatic cyst   . Hiatal hernia 2014  . Hyperlipidemia   . Irritable bowel syndrome   . Lactose intolerance   . Other and unspecified hyperlipidemia   . Personal history of colonic polyps 03/04/1992   adenomatous polyp  . Pneumonia   . Reflux esophagitis   . Schatzki's ring 2014  . Sleep apnea   . Vitamin B12 deficiency     Past Surgical History:  Procedure Laterality Date  . CATARACT EXTRACTION Left   . EYE SURGERY Left   . NOSE SURGERY    . PROSTATE SURGERY      Family History  Problem Relation Age of Onset  . Colon cancer Neg Hx   . Esophageal cancer Neg Hx   . Diabetes Neg Hx   . Heart disease Neg Hx    Social History:  reports that he quit smoking about 33 years ago. His smoking use included cigarettes. He smoked 1.00 pack per day. he has never used smokeless tobacco. He reports that he drinks alcohol. He reports that he does not use drugs.  Allergies:  Allergies  Allergen Reactions  . Lactose  Intolerance (Gi)     Facility-Administered Medications Prior to Admission  Medication Dose Route Frequency Provider Last Rate Last Dose  . cyanocobalamin ((VITAMIN B-12)) injection 1,000 mcg  1,000 mcg Intramuscular Q30 days Sable Feil, MD   1,000 mcg at 12/04/12 6213   Medications Prior to Admission  Medication Sig  Dispense Refill  . clidinium-chlordiazePOXIDE (LIBRAX) 5-2.5 MG per capsule Take 1 capsule 1-2 times daily as needed. 60 capsule 0  . furosemide (LASIX) 20 MG tablet Take 1/2 of your 40 mg tablets daily at 10 am (Patient taking differently: Take 40 mg by mouth daily. Take 1/2 of your 40 mg tablets daily at 10 am) 30 tablet 0  . Hyoscyamine Sulfate SL (LEVSIN/SL) 0.125 MG SUBL Dissolve 1 tablet on the tongue for stomach rumbling/diarrhea. 30 each 3  . isosorbide-hydrALAZINE (BIDIL) 20-37.5 MG per tablet Take 1 tablet by mouth 3 (three) times daily. 90 tablet 0  . losartan (COZAAR) 25 MG tablet Take 1/2 tab of your 50 mg tablets daily 30 tablet 0  . metoprolol (LOPRESSOR) 50 MG tablet Take 1 tablet (50 mg total) by mouth 3 (three) times daily. 90 tablet 0  . potassium chloride SA (K-DUR,KLOR-CON) 20 MEQ tablet Take 20 mEq by mouth daily.    . ranitidine (ZANTAC) 150 MG tablet Take 1 tablet (150 mg total) by mouth every morning. 30 tablet 3  . Rivaroxaban (XARELTO) 15 MG TABS tablet Take 15 mg by mouth daily.      No results found for this or any previous visit (from the past 48 hour(s)). No results found.  Review of Systems  Constitutional: Positive for malaise/fatigue.  HENT: Negative for hearing loss.   Eyes: Negative.   Respiratory: Positive for shortness of breath. Negative for cough.   Cardiovascular: Positive for orthopnea, leg swelling and PND. Negative for chest pain.  Gastrointestinal:       Abdominal swelling   Genitourinary: Negative.  Negative for flank pain.  Musculoskeletal: Negative for myalgias.  Skin: Negative.   Neurological: Negative for dizziness.  Endo/Heme/Allergies: Does not bruise/bleed easily.  Psychiatric/Behavioral:       Forgetfulness  All other systems reviewed and are negative.   There were no vitals taken for this visit. Physical Exam  Nursing note and vitals reviewed. Constitutional: He is oriented to person, place, and time. He appears  well-developed and well-nourished. No distress.  HENT:  Head: Normocephalic and atraumatic.  Eyes: Pupils are equal, round, and reactive to light.  Neck: Neck supple. JVD present.  Cardiovascular: Normal rate, regular rhythm, S1 normal and S2 normal.  Murmur heard. 3/6 mid systolic murmur is audible at apex. Gr.2/6 ESM in aortic area and LPSA.  Respiratory: Effort normal. No respiratory distress. He has rales.  GI: Soft. Bowel sounds are normal. He exhibits distension. There is no tenderness. There is no rebound.  Musculoskeletal: He exhibits edema (3+ b/l).  Lymphadenopathy:    He has no cervical adenopathy.  Neurological: He is alert and oriented to person, place, and time.  Skin: Skin is warm and dry.    Medications prior to this admission: arelto 20MG , 1 (one) Ta Tablet Tablet daily, #30, starting 09/14/2017, Ref. x6. Active. Associated Diagnosis: Atrial fibrillation (I48.91) Recorded 10/14/2017 12:21 PM by April Harrington (Authorized through Despina Hick, MD), Office Visit.  Metoprolol Tartrate 100MG , 1 (one) Tablet Tablet Tablet twice a day, #60, starting 08/11/2017, Ref. x2. Active. Associated Diagnosis: Chronic atrial fibrillation (I48.2) Recorded 10/14/2017 12:21 PM by April Harrington (Authorized through Ophiem  Carlynn Herald, MD), Office Visit.  Entresto 97-103MG , 1 (one) Tablet Tablet Tablet two times daily, #180, starting 08/11/2017, Ref. x2. Active. Comments: PA Approved 04/12/2017-10/31/2017 Associated Diagnosis: Chronic systolic congestive heart failure (I50.22) Recorded 10/14/2017 12:21 PM by April Harrington (Authorized through Laverda Page, MD), Office Visit.  Potassium Chloride ER 10MEQ, 1 (one) Tablet Tablet Tablet Tablet daily, #30, starting 05/30/2017, Ref. x3. Active. Associated Diagnosis: Hypertension (I10) Recorded 10/14/2017 12:21 PM by April Harrington (Authorized through Despina Hick, MD), Office Visit.  Furosemide 40MG , 1 Tablet Tablet  Tablet Tabl Tablet daily, #60, starting 01/31/2017, Ref. x3. Active. Associated Diagnosis: Chronic systolic congestive heart failure (I50.22) Recorded 08/23/2017 03:32 PM by April Harrington, Office Visit.  HydrOXYzine HCl (10MG  Tablet, 1 Oral three times daily as needed) Active. Recorded 10/14/2017 12:21 PM by April Harrington, Office Visit.  Fluticasone Propionate (50MCG/ACT Suspension, 1 spray each nostril Nasal as needed) Active. Recorded 10/14/2017 12:21 PM by April Harrington, Office Visit.  Ciprofloxacin HCl (0.3% Solution, appy to eyes Ophthalmic three times daily) Active. Recorded 10/14/2017 12:21 PM by April Harrington, Office Visit.  Durezol (0.05% Emulsion, apply to eyes Ophthalmic three times daily) Active. Recorded 10/14/2017 12:21 PM by April Harrington, Office Visit.  Travatan Z (0.004% Solution, apply to eyes Ophthalmic three times daily) Active. Recorded 10/14/2017 12:21 PM by April Harrington, Office Visit.  Hyoscyamine Sulfate (0.125MG  Tab Sublingual, 1 Sublingual daily) Active. Recorded 10/14/2017 12:29 PM by April Harrington, Office Visit.  Sildenafil Citrate (20MG  Tablet, 1 Oral daily) Active. Recorded 10/14/2017 12:21 PM by April Harrington, Office Visit.  RaNITidine HCl (150MG  Tablet, 1 Oral daily) Active. Recorded 10/14/2017 12:21 PM by April Harrington, Office Visit.   EKG: Pending   Assessment:  83 year old African-American male with chronic systolic and diastolic heart failure, EF 20%, persistent atrial fibrillation, hyperlipidemia, admitted with heart failure decompensation.  Acute on chronic systolic and diastolic heart failure, likely nonischemic cardiomyopathy, but no prior cath Recent stress test with no ischemia Persistent atrial fibrillation CHA2DS2VASc score 4, annual risk 4.8%   Hypertension  Plan: Admit to telemetry IV Lasix 40 mg q6 hours.  Daily weights, strict intake and output.  Electrolyte replacement as necessary. Continue  baseline Dilantin active medical therapy for heart failure, including Entresto 97-1 03 mg twice daily, Spironolactone 50 mg once daily, metoprolol tartrate 100 mg twice daily.  May switch to metoprolol succinate on discharge. Unsure if patient is taking sildenafil. Will not resume at this time.  Continue xarelto 20 mg daily. May need cath at some point to rule out ischemic cardiomyopathy, even though recent stress test is normal.    Nigel Mormon, MD 10/14/2017, 1:27 PM   Nigel Mormon, MD [facility] [place] Pager: [phone] Office: [phone] If no answer Cell [phone]

## 2017-10-15 ENCOUNTER — Inpatient Hospital Stay (HOSPITAL_COMMUNITY): Payer: PPO

## 2017-10-15 LAB — CBC WITH DIFFERENTIAL/PLATELET
BASOS PCT: 0 %
Basophils Absolute: 0 10*3/uL (ref 0.0–0.1)
EOS PCT: 2 %
Eosinophils Absolute: 0.1 10*3/uL (ref 0.0–0.7)
HEMATOCRIT: 45.3 % (ref 39.0–52.0)
Hemoglobin: 14.4 g/dL (ref 13.0–17.0)
Lymphocytes Relative: 35 %
Lymphs Abs: 1.1 10*3/uL (ref 0.7–4.0)
MCH: 30.7 pg (ref 26.0–34.0)
MCHC: 31.8 g/dL (ref 30.0–36.0)
MCV: 96.6 fL (ref 78.0–100.0)
MONO ABS: 0.4 10*3/uL (ref 0.1–1.0)
MONOS PCT: 13 %
NEUTROS ABS: 1.6 10*3/uL — AB (ref 1.7–7.7)
Neutrophils Relative %: 50 %
PLATELETS: 95 10*3/uL — AB (ref 150–400)
RBC: 4.69 MIL/uL (ref 4.22–5.81)
RDW: 14 % (ref 11.5–15.5)
WBC: 3.3 10*3/uL — ABNORMAL LOW (ref 4.0–10.5)

## 2017-10-15 LAB — BASIC METABOLIC PANEL
Anion gap: 10 (ref 5–15)
BUN: 15 mg/dL (ref 6–20)
CALCIUM: 8.8 mg/dL — AB (ref 8.9–10.3)
CO2: 32 mmol/L (ref 22–32)
CREATININE: 1.39 mg/dL — AB (ref 0.61–1.24)
Chloride: 101 mmol/L (ref 101–111)
GFR calc Af Amer: 53 mL/min — ABNORMAL LOW (ref >60)
GFR calc non Af Amer: 46 mL/min — ABNORMAL LOW (ref >60)
GLUCOSE: 83 mg/dL (ref 65–99)
Potassium: 3.3 mmol/L — ABNORMAL LOW (ref 3.5–5.1)
Sodium: 143 mmol/L (ref 135–145)

## 2017-10-15 MED ORDER — RIVAROXABAN 20 MG PO TABS
20.0000 mg | ORAL_TABLET | Freq: Once | ORAL | Status: AC
  Administered 2017-10-15: 20 mg via ORAL
  Filled 2017-10-15: qty 1

## 2017-10-15 MED ORDER — FUROSEMIDE 10 MG/ML IJ SOLN
40.0000 mg | Freq: Two times a day (BID) | INTRAMUSCULAR | Status: DC
  Administered 2017-10-15: 40 mg via INTRAVENOUS
  Filled 2017-10-15: qty 4

## 2017-10-15 MED ORDER — FUROSEMIDE 40 MG PO TABS
40.0000 mg | ORAL_TABLET | Freq: Two times a day (BID) | ORAL | Status: DC
  Administered 2017-10-15 – 2017-10-18 (×6): 40 mg via ORAL
  Filled 2017-10-15 (×7): qty 1

## 2017-10-15 MED ORDER — POTASSIUM CHLORIDE CRYS ER 20 MEQ PO TBCR
40.0000 meq | EXTENDED_RELEASE_TABLET | Freq: Once | ORAL | Status: AC
  Administered 2017-10-15: 40 meq via ORAL
  Filled 2017-10-15: qty 2

## 2017-10-15 MED ORDER — RIVAROXABAN 20 MG PO TABS: 20.0000 mg | ORAL_TABLET | Freq: Every day | ORAL | Status: DC

## 2017-10-15 NOTE — Progress Notes (Addendum)
Subjective:  Feels better. Did not sleep last night due to urinary frequency.  Objective:  Vital Signs in the last 24 hours: Temp:  [97.5 F (36.4 C)-98.3 F (36.8 C)] 98.2 F (36.8 C) (12/15 0554) Pulse Rate:  [65-92] 92 (12/15 0554) Resp:  [18] 18 (12/15 0554) BP: (130-148)/(91-97) 142/97 (12/15 0554) SpO2:  [96 %-100 %] 96 % (12/15 0554) FiO2 (%):  [21 %] 21 % (12/14 1404) Weight:  [99.4 kg (219 lb 3.2 oz)-102.8 kg (226 lb 9.6 oz)] 99.4 kg (219 lb 3.2 oz) (12/15 0554)  Intake/Output from previous day: 12/14 0701 - 12/15 0700 In: 480 [P.O.:480] Out: 2550 [Urine:2550] Intake/Output from this shift: Total I/O In: 483 [P.O.:480; I.V.:3] Out: 350 [Urine:350]   Net -1.9 L  Physical Exam: Physical Exam  Nursing note and vitals reviewed. Constitutional: He is oriented to person, place, and time. He appears well-developed and well-nourished.  HENT:  Head: Normocephalic and atraumatic.  Neck: JVD present.  Cardiovascular: Normal rate.  Murmur (II/VI LLSB holosystolic) heard. Irregularly irregular rhythm  Respiratory: Effort normal. He has no rales.  GI: Soft. Bowel sounds are normal. He exhibits no distension. There is no tenderness.  Musculoskeletal: He exhibits edema (1+ b/l. Much improved).  Lymphadenopathy:   He has no cervical adenopathy.  Neurological: He is alert and oriented to person, place, and time. No cranial nerve deficit. Coordination normal.  Skin: Skin is warm and dry.  Psychiatric: He has a normal mood and affect.    Lab Results: Recent Labs    10/14/17 1434 10/15/17 0513  WBC 3.1* 3.3*  HGB 14.5 14.4  PLT 98* 95*   Recent Labs    10/14/17 1434 10/15/17 0513  NA 142 143  K 3.4* 3.3*  CL 107 101  CO2 28 32  GLUCOSE 77 83  BUN 16 15  CREATININE 1.29* 1.39*   No results for input(s): TROPONINI in the last 72 hours.  Invalid input(s): CK, MB Hepatic Function Panel Recent Labs    10/14/17 1434  PROT 5.8*  ALBUMIN 3.3*  AST 24  ALT 10*   ALKPHOS 64  BILITOT 1.8*   Imaging: CXR 08/16/2017 CLINICAL DATA:  Shortness of Breath  EXAM: CHEST  2 VIEW  COMPARISON:  10/17/2015  FINDINGS: Cardiac shadow is significantly enlarged when compared with the prior exam. Aortic calcifications are again seen. The lungs are well aerated bilaterally. Patchy left basilar infiltrate is seen. No bony abnormality is noted. Small bilateral pleural effusions are noted.  IMPRESSION: Patchy left basilar infiltrate.  Significant cardiac enlargement when compared with the prior exam.   Cardiac Studies:   Assessment:  83 year old African-American male with chronic systolic and diastolic heart failure, EF 20%, persistent atrial fibrillation, hyperlipidemia, admitted with heart failure decompensation.  Acute on chronic systolic and diastolic heart failure, likely nonischemic cardiomyopathy, but no prior cath Recent stress test with no ischemia Persistent atrial fibrillation CHA2DS2VASc score 4, annual risk 4.8%   Hypertension  Plan: Admit to telemetry IV Lasix switched to 40 mg q12 hours + {O lasix 40 mg q12 hr.  Daily weights, strict intake and output.  Electrolyte replacement as necessary. Continue baseline guideline directed medical therapy for heart failure, including Entresto 97-103 mg twice daily, Spironolactone 50 mg once daily, metoprolol tartrate 100 mg twice daily.  May switch to metoprolol succinate on discharge. Unsure if patient is taking sildenafil. Will not resume at this time.  Continue xarelto 20 mg daily today. Hold on Sunday in preparation for cath on Monday (RHC/LHC/Cor)  10/17/17 If nonischemic cardiomyopathy established, will discuss with EP regarding ICD placement.    LOS: 1 day    Ronald [name] J Ivet Guerrieri 10/15/2017, 9:59 AM

## 2017-10-15 NOTE — Plan of Care (Signed)
Nutrition Consult/Brief Note  RD consulted for nutrition education regarding new onset CHF.  RD provided "Heart Failure Nutrition Therapy" handout from the Academy of [employer] Dietetics. Reviewed patient's dietary recall. Provided examples on ways to decrease sodium intake in diet. Discouraged intake of processed foods and use of salt shaker. Encouraged fresh fruits and vegetables as well as whole grains sources of carbohydrates to maximize fiber intake.   Body mass index is 28.92 kg/m. Pt meets criteria for Overweight based on current BMI.  Current diet order is 2 gm Sodium, patient is consuming approximately 100% of meals at this time.   If additional nutrition issues arise, please re-consult RD.   Arthur Holms, RD, LDN Pager #: [phone] After-Hours Pager #: [phone]

## 2017-10-16 LAB — BASIC METABOLIC PANEL
Anion gap: 8 (ref 5–15)
BUN: 13 mg/dL (ref 6–20)
CALCIUM: 8.7 mg/dL — AB (ref 8.9–10.3)
CO2: 31 mmol/L (ref 22–32)
CREATININE: 1.25 mg/dL — AB (ref 0.61–1.24)
Chloride: 102 mmol/L (ref 101–111)
GFR calc Af Amer: >60 mL/min (ref >60)
GFR calc non Af Amer: 52 mL/min — ABNORMAL LOW (ref >60)
GLUCOSE: 85 mg/dL (ref 65–99)
Potassium: 3.4 mmol/L — ABNORMAL LOW (ref 3.5–5.1)
Sodium: 141 mmol/L (ref 135–145)

## 2017-10-16 LAB — CBC WITH DIFFERENTIAL/PLATELET
BASOS PCT: 0 %
Basophils Absolute: 0 10*3/uL (ref 0.0–0.1)
EOS PCT: 2 %
Eosinophils Absolute: 0.1 10*3/uL (ref 0.0–0.7)
HEMATOCRIT: 44.3 % (ref 39.0–52.0)
Hemoglobin: 14.2 g/dL (ref 13.0–17.0)
Lymphocytes Relative: 42 %
Lymphs Abs: 1.3 10*3/uL (ref 0.7–4.0)
MCH: 30.3 pg (ref 26.0–34.0)
MCHC: 32.1 g/dL (ref 30.0–36.0)
MCV: 94.7 fL (ref 78.0–100.0)
MONO ABS: 0.3 10*3/uL (ref 0.1–1.0)
MONOS PCT: 10 %
NEUTROS ABS: 1.4 10*3/uL — AB (ref 1.7–7.7)
Neutrophils Relative %: 45 %
PLATELETS: 99 10*3/uL — AB (ref 150–400)
RBC: 4.68 MIL/uL (ref 4.22–5.81)
RDW: 13.8 % (ref 11.5–15.5)
WBC: 3.2 10*3/uL — ABNORMAL LOW (ref 4.0–10.5)

## 2017-10-16 MED ORDER — METOPROLOL SUCCINATE ER 100 MG PO TB24
200.0000 mg | ORAL_TABLET | Freq: Every day | ORAL | Status: DC
  Administered 2017-10-16 – 2017-10-18 (×3): 200 mg via ORAL
  Filled 2017-10-16 (×3): qty 2

## 2017-10-16 MED ORDER — SODIUM CHLORIDE 0.9% FLUSH: 3.0000 mL | INTRAVENOUS | Status: DC | PRN

## 2017-10-16 MED ORDER — SODIUM CHLORIDE 0.9 % IV SOLN
INTRAVENOUS | Status: DC
  Administered 2017-10-17: 70 mL via INTRAVENOUS

## 2017-10-16 MED ORDER — SODIUM CHLORIDE 0.9 % IV SOLN: 250.0000 mL | INTRAVENOUS | Status: DC | PRN

## 2017-10-16 MED ORDER — SODIUM CHLORIDE 0.9% FLUSH
3.0000 mL | Freq: Two times a day (BID) | INTRAVENOUS | Status: DC
  Administered 2017-10-17: 3 mL via INTRAVENOUS

## 2017-10-16 MED ORDER — POTASSIUM CHLORIDE CRYS ER 20 MEQ PO TBCR
40.0000 meq | EXTENDED_RELEASE_TABLET | Freq: Once | ORAL | Status: AC
  Administered 2017-10-16: 40 meq via ORAL
  Filled 2017-10-16: qty 2

## 2017-10-16 MED ORDER — ASPIRIN 81 MG PO CHEW
81.0000 mg | CHEWABLE_TABLET | ORAL | Status: AC
  Administered 2017-10-17: 81 mg via ORAL
  Filled 2017-10-16: qty 1

## 2017-10-16 NOTE — Plan of Care (Signed)
  Coping: Level of anxiety will decrease 10/16/2017 2326 - Progressing by Rolm Baptise, RN   Pain Managment: General experience of comfort will improve 10/16/2017 2326 - Progressing by Rolm Baptise, RN   Safety: Ability to remain free from injury will improve 10/16/2017 2326 - Progressing by Rolm Baptise, RN   Skin Integrity: Risk for impaired skin integrity will decrease 10/16/2017 2326 - Progressing by Rolm Baptise, RN

## 2017-10-16 NOTE — Progress Notes (Signed)
Subjective:  Feeling well Breathing and edema improved  Objective:  Vital Signs in the last 24 hours: Temp:  [97.7 F (36.5 C)-98.2 F (36.8 C)] 98.2 F (36.8 C) (12/16 0522) Pulse Rate:  [62-80] 76 (12/16 0522) Resp:  [20] 20 (12/16 0522) BP: (112-141)/(76-88) 138/88 (12/16 0522) SpO2:  [96 %-99 %] 97 % (12/16 0522) Weight:  [93.4 kg (205 lb 14.4 oz)] 93.4 kg (205 lb 14.4 oz) (12/16 0522)  Intake/Output from previous day: 12/15 0701 - 12/16 0700 In: 1203 [P.O.:1200; I.V.:3] Out: 6575 [Urine:6575] Intake/Output from this shift: Total I/O In: 240 [P.O.:240] Out: -    Net -7.2 L Wt 219 lb on admission, 205 lb today  Physical Exam: Physical Exam  Nursing note and vitals reviewed. Constitutional: He is oriented to person, place, and time. He appears well-developed and well-nourished.  HENT:  Head: Normocephalic and atraumatic.  Neck: JVD present.  Cardiovascular: Normal rate.  Murmur (II/VI LLSB holosystolic) heard. Irregularly irregular rhythm   Respiratory: Effort normal. He has no rales.  GI: Soft. Bowel sounds are normal. He exhibits no distension. There is no tenderness.  Musculoskeletal: He exhibits edema (1+ b/l. Much improved).  Lymphadenopathy:    He has no cervical adenopathy.  Neurological: He is alert and oriented to person, place, and time. No cranial nerve deficit. Coordination normal.  Skin: Skin is warm and dry.  Psychiatric: He has a normal mood and affect.    Lab Results: Recent Labs    10/15/17 0513 10/16/17 0335  WBC 3.3* 3.2*  HGB 14.4 14.2  PLT 95* 99*   Recent Labs    10/15/17 0513 10/16/17 0335  NA 143 141  K 3.3* 3.4*  CL 101 102  CO2 32 31  GLUCOSE 83 85  BUN 15 13  CREATININE 1.39* 1.25*   No results for input(s): TROPONINI in the last 72 hours.  Invalid input(s): CK, MB Hepatic Function Panel Recent Labs    10/14/17 1434  PROT 5.8*  ALBUMIN 3.3*  AST 24  ALT 10*  ALKPHOS 64  BILITOT 1.8*   Imaging: CXR  08/16/2017 CLINICAL DATA:  Shortness of Breath  EXAM: CHEST  2 VIEW  COMPARISON:  10/17/2015  FINDINGS: Cardiac shadow is significantly enlarged when compared with the prior exam. Aortic calcifications are again seen. The lungs are well aerated bilaterally. Patchy left basilar infiltrate is seen. No bony abnormality is noted. Small bilateral pleural effusions are noted.  IMPRESSION: Patchy left basilar infiltrate.  Significant cardiac enlargement when compared with the prior exam.   Cardiac Studies:   Assessment:  83 year old African-American male with chronic systolic and diastolic heart failure, EF 20%, persistent atrial fibrillation, hyperlipidemia, admitted with heart failure decompensation.  Acute on chronic systolic and diastolic heart failure, likely nonischemic cardiomyopathy, but no prior cath Recent stress test with no ischemia Persistent atrial fibrillation CHA2DS2VASc score 4, annual risk 4.8%   Hypertension CKD 3: Cr at baseline 1.25  Plan: Admit to telemetry Stop IV Lasix today. Continue PO lasix 40 mg bid. Daily weights, strict intake and output.  Electrolyte replacement as necessary. Continue baseline guideline directed medical therapy for heart failure, including Entresto 97-103 mg twice daily, Spironolactone 50 mg once daily. Switch metoprolol tartrate 100 mg twice daily to metoprolol succinate 200 mg daily. Unsure if patient is taking sildenafil. Will not resume at this time.  Continue xarelto 20 mg daily today. Hold on Sunday in preparation for cath on Monday (RHC/LHC/Cor) 10/17/17 If nonischemic cardiomyopathy established, will discuss with EP regarding  ICD placement.    LOS: 2 days    Joshuajames Moehring J Harrietta Incorvaia 10/16/2017, 9:47 AM  [place], MD [facility] [place] Pager: [phone] Office: [phone] If no answer Cell [phone]

## 2017-10-17 ENCOUNTER — Encounter (HOSPITAL_COMMUNITY)
Admission: AD | Disposition: A | Discharge status: Home / Self Care | Payer: Self-pay | Source: Ambulatory Visit | Attending: Cardiology

## 2017-10-17 HISTORY — PX: INTRAVASCULAR PRESSURE WIRE/FFR STUDY: CATH118243

## 2017-10-17 HISTORY — PX: ULTRASOUND GUIDANCE FOR VASCULAR ACCESS: SHX6516

## 2017-10-17 HISTORY — PX: RIGHT/LEFT HEART CATH AND CORONARY ANGIOGRAPHY: CATH118266

## 2017-10-17 LAB — CBC WITH DIFFERENTIAL/PLATELET
BASOS PCT: 0 %
Basophils Absolute: 0 10*3/uL (ref 0.0–0.1)
EOS ABS: 0.1 10*3/uL (ref 0.0–0.7)
Eosinophils Relative: 2 %
HCT: 44.2 % (ref 39.0–52.0)
Hemoglobin: 14.1 g/dL (ref 13.0–17.0)
Lymphocytes Relative: 44 %
Lymphs Abs: 1.5 10*3/uL (ref 0.7–4.0)
MCH: 30.4 pg (ref 26.0–34.0)
MCHC: 31.9 g/dL (ref 30.0–36.0)
MCV: 95.3 fL (ref 78.0–100.0)
MONO ABS: 0.4 10*3/uL (ref 0.1–1.0)
MONOS PCT: 11 %
NEUTROS PCT: 43 %
Neutro Abs: 1.4 10*3/uL — ABNORMAL LOW (ref 1.7–7.7)
Platelets: 108 10*3/uL — ABNORMAL LOW (ref 150–400)
RBC: 4.64 MIL/uL (ref 4.22–5.81)
RDW: 13.7 % (ref 11.5–15.5)
WBC: 3.3 10*3/uL — ABNORMAL LOW (ref 4.0–10.5)

## 2017-10-17 LAB — POCT I-STAT 3, ART BLOOD GAS (G3+)
Acid-Base Excess: 3 mmol/L — ABNORMAL HIGH (ref 0.0–2.0)
BICARBONATE: 30.5 mmol/L — AB (ref 20.0–28.0)
O2 Saturation: 90 %
PH ART: 7.344 — AB (ref 7.350–7.450)
PO2 ART: 64 mmHg — AB (ref 83.0–108.0)
TCO2: 32 mmol/L (ref 22–32)
pCO2 arterial: 56 mmHg — ABNORMAL HIGH (ref 32.0–48.0)

## 2017-10-17 LAB — POCT I-STAT 3, VENOUS BLOOD GAS (G3P V)
Acid-Base Excess: 5 mmol/L — ABNORMAL HIGH (ref 0.0–2.0)
BICARBONATE: 32.5 mmol/L — AB (ref 20.0–28.0)
O2 SAT: 67 %
PCO2 VEN: 57.8 mmHg (ref 44.0–60.0)
PO2 VEN: 37 mmHg (ref 32.0–45.0)
TCO2: 34 mmol/L — AB (ref 22–32)
pH, Ven: 7.358 (ref 7.250–7.430)

## 2017-10-17 LAB — POCT ACTIVATED CLOTTING TIME
ACTIVATED CLOTTING TIME: 412 s
Activated Clotting Time: 241 seconds

## 2017-10-17 LAB — BASIC METABOLIC PANEL
Anion gap: 8 (ref 5–15)
BUN: 15 mg/dL (ref 6–20)
CO2: 34 mmol/L — AB (ref 22–32)
CREATININE: 1.31 mg/dL — AB (ref 0.61–1.24)
Calcium: 8.8 mg/dL — ABNORMAL LOW (ref 8.9–10.3)
Chloride: 99 mmol/L — ABNORMAL LOW (ref 101–111)
GFR calc non Af Amer: 49 mL/min — ABNORMAL LOW (ref >60)
GFR, EST AFRICAN AMERICAN: 57 mL/min — AB (ref >60)
GLUCOSE: 86 mg/dL (ref 65–99)
Potassium: 3.8 mmol/L (ref 3.5–5.1)
Sodium: 141 mmol/L (ref 135–145)

## 2017-10-17 LAB — PROTIME-INR
INR: 1.81
PROTHROMBIN TIME: 20.8 s — AB (ref 11.4–15.2)

## 2017-10-17 SURGERY — RIGHT/LEFT HEART CATH AND CORONARY ANGIOGRAPHY
Anesthesia: LOCAL

## 2017-10-17 MED ORDER — HEPARIN SODIUM (PORCINE) 1000 UNIT/ML IJ SOLN
INTRAMUSCULAR | Status: AC
  Filled 2017-10-17: qty 1

## 2017-10-17 MED ORDER — SODIUM CHLORIDE 0.9 % IV SOLN: 250.0000 mL | INTRAVENOUS | Status: DC | PRN

## 2017-10-17 MED ORDER — NITROGLYCERIN 1 MG/10 ML FOR IR/CATH LAB
INTRA_ARTERIAL | Status: AC
  Filled 2017-10-17: qty 10

## 2017-10-17 MED ORDER — MIDAZOLAM HCL 2 MG/2ML IJ SOLN
INTRAMUSCULAR | Status: DC | PRN
  Administered 2017-10-17: 0.5 mg via INTRAVENOUS
  Administered 2017-10-17: 1 mg via INTRAVENOUS

## 2017-10-17 MED ORDER — HEPARIN SODIUM (PORCINE) 1000 UNIT/ML IJ SOLN
INTRAMUSCULAR | Status: DC | PRN
  Administered 2017-10-17: 3000 [IU] via INTRAVENOUS
  Administered 2017-10-17: 4000 [IU] via INTRAVENOUS
  Administered 2017-10-17 (×2): 2000 [IU] via INTRAVENOUS

## 2017-10-17 MED ORDER — ADENOSINE 12 MG/4ML IV SOLN
INTRAVENOUS | Status: AC
  Filled 2017-10-17: qty 16

## 2017-10-17 MED ORDER — ADENOSINE 12 MG/4ML IV SOLN
INTRAVENOUS | Status: AC
  Filled 2017-10-17: qty 12

## 2017-10-17 MED ORDER — ONDANSETRON HCL 4 MG/2ML IJ SOLN: 4.0000 mg | Freq: Four times a day (QID) | INTRAMUSCULAR | Status: DC | PRN

## 2017-10-17 MED ORDER — SODIUM CHLORIDE 0.9% FLUSH: 3.0000 mL | INTRAVENOUS | Status: DC | PRN

## 2017-10-17 MED ORDER — MIDAZOLAM HCL 2 MG/2ML IJ SOLN
INTRAMUSCULAR | Status: AC
  Filled 2017-10-17: qty 2

## 2017-10-17 MED ORDER — HEPARIN (PORCINE) IN NACL 2-0.9 UNIT/ML-% IJ SOLN
INTRAMUSCULAR | Status: AC
  Filled 2017-10-17: qty 1000

## 2017-10-17 MED ORDER — ADENOSINE (DIAGNOSTIC) 140MCG/KG/MIN
INTRAVENOUS | Status: DC | PRN
  Administered 2017-10-17: 140 ug/kg/min via INTRAVENOUS

## 2017-10-17 MED ORDER — VERAPAMIL HCL 2.5 MG/ML IV SOLN
INTRAVENOUS | Status: AC
  Filled 2017-10-17: qty 2

## 2017-10-17 MED ORDER — SODIUM CHLORIDE 0.9 % IV SOLN
INTRAVENOUS | Status: AC | PRN
  Administered 2017-10-17: 10 mL/h via INTRAVENOUS

## 2017-10-17 MED ORDER — IOPAMIDOL (ISOVUE-300) INJECTION 61%
INTRAVENOUS | Status: AC
  Filled 2017-10-17: qty 50

## 2017-10-17 MED ORDER — ACETAMINOPHEN 325 MG PO TABS: 650.0000 mg | ORAL_TABLET | ORAL | Status: DC | PRN

## 2017-10-17 MED ORDER — VERAPAMIL HCL 2.5 MG/ML IV SOLN
INTRAVENOUS | Status: DC | PRN
  Administered 2017-10-17 (×2): 3 mL via INTRA_ARTERIAL

## 2017-10-17 MED ORDER — SODIUM CHLORIDE 0.9% FLUSH
3.0000 mL | Freq: Two times a day (BID) | INTRAVENOUS | Status: DC
  Administered 2017-10-17: 3 mL via INTRAVENOUS

## 2017-10-17 MED ORDER — NITROGLYCERIN 1 MG/10 ML FOR IR/CATH LAB
INTRA_ARTERIAL | Status: DC | PRN
  Administered 2017-10-17 (×2): 100 ug via INTRACORONARY

## 2017-10-17 MED ORDER — IOPAMIDOL (ISOVUE-370) INJECTION 76%
INTRAVENOUS | Status: DC | PRN
  Administered 2017-10-17: 120 mL via INTRA_ARTERIAL

## 2017-10-17 MED ORDER — SODIUM CHLORIDE 0.9 % IV SOLN: INTRAVENOUS | Status: AC

## 2017-10-17 MED ORDER — FENTANYL CITRATE (PF) 100 MCG/2ML IJ SOLN
INTRAMUSCULAR | Status: AC
  Filled 2017-10-17: qty 2

## 2017-10-17 MED ORDER — IOPAMIDOL (ISOVUE-370) INJECTION 76%
INTRAVENOUS | Status: AC
  Filled 2017-10-17: qty 100

## 2017-10-17 MED ORDER — LIDOCAINE HCL (PF) 1 % IJ SOLN
INTRAMUSCULAR | Status: AC
  Filled 2017-10-17: qty 30

## 2017-10-17 MED ORDER — LIDOCAINE HCL (PF) 1 % IJ SOLN
INTRAMUSCULAR | Status: DC | PRN
  Administered 2017-10-17: 1 mL via SUBCUTANEOUS
  Administered 2017-10-17 (×2): 2 mL via SUBCUTANEOUS

## 2017-10-17 MED ORDER — HEPARIN (PORCINE) IN NACL 2-0.9 UNIT/ML-% IJ SOLN
INTRAMUSCULAR | Status: AC | PRN
  Administered 2017-10-17: 1000 mL

## 2017-10-17 MED ORDER — FENTANYL CITRATE (PF) 100 MCG/2ML IJ SOLN
INTRAMUSCULAR | Status: DC | PRN
  Administered 2017-10-17: 25 ug via INTRAVENOUS
  Administered 2017-10-17: 12.5 ug via INTRAVENOUS

## 2017-10-17 SURGICAL SUPPLY — 21 items
CATH BALLN WEDGE 5F 110CM (CATHETERS) ×1 IMPLANT
CATH INFINITI 5 FR 3DRC (CATHETERS) ×2 IMPLANT
CATH INFINITI 5 FR AR1 MOD (CATHETERS) ×1 IMPLANT
CATH INFINITI 5 FR JL3.5 (CATHETERS) ×1 IMPLANT
CATH INFINITI 5FR JR4 125CM (CATHETERS) ×1 IMPLANT
CATH INFINITI JR4 5F (CATHETERS) ×1 IMPLANT
CATH LAUNCHER 6FR EBU3.5 (CATHETERS) ×1 IMPLANT
COVER PRB 48X5XTLSCP FOLD TPE (BAG) IMPLANT
COVER PROBE 5X48 (BAG) ×3
DEVICE RAD COMP TR BAND LRG (VASCULAR PRODUCTS) ×1 IMPLANT
GLIDESHEATH SLEND A-KIT 6F 22G (SHEATH) ×1 IMPLANT
GUIDEWIRE .025 260CM (WIRE) ×1 IMPLANT
GUIDEWIRE INQWIRE 1.5J.035X260 (WIRE) IMPLANT
GUIDEWIRE PRESSURE COMET II (WIRE) ×1 IMPLANT
INQWIRE 1.5J .035X260CM (WIRE) ×3
KIT ESSENTIALS PG (KITS) ×1 IMPLANT
KIT HEART LEFT (KITS) ×3 IMPLANT
PACK CARDIAC CATHETERIZATION (CUSTOM PROCEDURE TRAY) ×3 IMPLANT
SHEATH GLIDE SLENDER 4/5FR (SHEATH) ×1 IMPLANT
TRANSDUCER W/STOPCOCK (MISCELLANEOUS) ×3 IMPLANT
TUBING CIL FLEX 10 FLL-RA (TUBING) ×3 IMPLANT

## 2017-10-17 NOTE — Consult Note (Signed)
   [facility] CM Inpatient Consult   10/17/2017  Ronald [name] 1934/06/01 [id number]   Patient screened for potential [place] [facility] services. Patient is in the [place] of the [place] [facility] services under patient's [employer] ACO plan.  Admitted with HF exacerbation with atrial fib.  Current high risk score is low.  Came by to speak with the patient but he was currently in patient care. Chart reviewed and will follow up at a more convenient time. For questions contact:   Natividad Brood, RN BSN [facility] Liaison  [phone] business mobile phone Toll free office [phone]

## 2017-10-17 NOTE — Progress Notes (Signed)
Order for sheath removal verified per post procedural orders. Procedure explained to patient and right brachial access site assessed: level 0, palpable dorsalis pedis and posterior tibial pulses. 5 [place] Sheath removed and manual pressure applied for 25 minutes. Pre, peri, & post procedural vitals: HR 70, RR 12, O2 Sat 98%, BP 138/81 Pain level 0. Distal pulses remained intact after sheath removal. Access site level 0 and dressed with 4X4 gauze and tegaderm.  RN confirmed condition of site. Post procedural instructions discussed with return demonstration from patient.

## 2017-10-17 NOTE — Progress Notes (Signed)
Subjective:  Feeling well Breathing and edema improved  Objective:  Vital Signs in the last 24 hours: Temp:  [97.6 F (36.4 C)-97.9 F (36.6 C)] 97.6 F (36.4 C) (12/17 0457) Pulse Rate:  [0-103] 45 (12/17 1200) Resp:  [0-22] 10 (12/17 1200) BP: (115-149)/(73-106) 142/81 (12/17 1200) SpO2:  [0 %-99 %] 99 % (12/17 1200) Weight:  [92.1 kg (203 lb)] 92.1 kg (203 lb) (12/17 0457)  Intake/Output from previous day: 12/16 0701 - 12/17 0700 In: 723 [P.O.:720; I.V.:3] Out: 2400 [Urine:2400] Intake/Output from this shift: Total I/O In: 117.2 [I.V.:117.2] Out: 325 [Urine:325]   Net -7.2 L Wt 219 lb on admission, 205 lb today  Physical Exam: Physical Exam  Nursing note and vitals reviewed. Constitutional: He is oriented to person, place, and time. He appears well-developed and well-nourished.  HENT:  Head: Normocephalic and atraumatic.  Neck: JVD present.  Cardiovascular: Normal rate.  Murmur (II/VI LLSB holosystolic) heard. Irregularly irregular rhythm  Right radial site with no bleeding, hematoma. Good capillary refill.  Respiratory: Effort normal. He has no rales.  GI: Soft. Bowel sounds are normal. He exhibits no distension. There is no tenderness.  Musculoskeletal: He exhibits edema (1+ b/l. Much improved).  Lymphadenopathy:    He has no cervical adenopathy.  Neurological: He is alert and oriented to person, place, and time. No cranial nerve deficit. Coordination normal.  Skin: Skin is warm and dry.  Psychiatric: He has a normal mood and affect.    Lab Results: Recent Labs    10/16/17 0335 10/17/17 0524  WBC 3.2* 3.3*  HGB 14.2 14.1  PLT 99* 108*   Recent Labs    10/16/17 0335 10/17/17 0524  NA 141 141  K 3.4* 3.8  CL 102 99*  CO2 31 34*  GLUCOSE 85 86  BUN 13 15  CREATININE 1.25* 1.31*   Hepatic Function Panel Recent Labs    10/14/17 1434  PROT 5.8*  ALBUMIN 3.3*  AST 24  ALT 10*  ALKPHOS 64  BILITOT 1.8*   Imaging: CXR 08/16/2017 CLINICAL  DATA:  Shortness of Breath  EXAM: CHEST  2 VIEW  COMPARISON:  10/17/2015  FINDINGS: Cardiac shadow is significantly enlarged when compared with the prior exam. Aortic calcifications are again seen. The lungs are well aerated bilaterally. Patchy left basilar infiltrate is seen. No bony abnormality is noted. Small bilateral pleural effusions are noted.  IMPRESSION: Patchy left basilar infiltrate.  Significant cardiac enlargement when compared with the prior exam.   Cardiac Studies: Left and right heart cath 10/17/2017: Nonobstrtuctive coronary artery disease Nonischemic cardiomyopathy WHO Grp II pulmonary hypertension  Assessment:  83 year old African-American male with chronic systolic and diastolic heart failure, EF 20%, persistent atrial fibrillation, hyperlipidemia, admitted with heart failure decompensation.  Acute on chronic systolic and diastolic heart failure Nonischemic cardiomyopathy Recent stress test with no ischemia Persistent atrial fibrillation CHA2DS2VASc score 4, annual risk 4.8%   Hypertension CKD 3: Cr at baseline 1.25  Plan: Admit to telemetry Continue PO lasix 40 mg bid. Daily weights, strict intake and output.  Electrolyte replacement as necessary. Continue baseline guideline directed medical therapy for heart failure, including Entresto 97-103 mg twice daily, Spironolactone 50 mg once daily. Cotinue metoprolol tartrate 200 mg once daily. Unsure if patient is taking sildenafil. Will not resume at this time.  Resume xarelto 20 mg daily today, unless requested to hold by EP. Will discuss with EP regarding ICD placement for SCD prevention.    LOS: 3 days    [place]  10/17/2017, 1:52 PM  [place], MD [facility] Pager: [phone] Office: [phone] If no answer Cell [phone]

## 2017-10-17 NOTE — Interval H&P Note (Signed)
History and Physical Interval Note:  10/17/2017 9:41 AM  Ronald [name]  has presented today for surgery, with the diagnosis of hp  The various methods of treatment have been discussed with the patient and family. After consideration of risks, benefits and other options for treatment, the patient has consented to  Procedure(s): RIGHT/LEFT HEART CATH AND CORONARY ANGIOGRAPHY (N/A) as a surgical intervention .  The patient's history has been reviewed, patient examined, no change in status, stable for surgery.  I have reviewed the patient's chart and labs.  Questions were answered to the patient's satisfaction.    Indication:  1. Known or suspected cardiomyopathy with or without heart failure A (7) Indication: 93; Score 7       [name] J Simara Rhyner

## 2017-10-17 NOTE — Plan of Care (Signed)
  Progressing Clinical Measurements: Ability to maintain clinical measurements within normal limits will improve 10/17/2017 0742 - Progressing by Lavonna Rua, RN Will remain free from infection 10/17/2017 0742 - Progressing by Lavonna Rua, RN Diagnostic test results will improve 10/17/2017 [phone] - Progressing by Lavonna Rua, RN Respiratory complications will improve 10/17/2017 0742 - Progressing by Lavonna Rua, RN Cardiovascular complication will be avoided 10/17/2017 0742 - Progressing by Lavonna Rua, RN Activity: Risk for activity intolerance will decrease 10/17/2017 0742 - Progressing by Lavonna Rua, RN Coping: Level of anxiety will decrease 10/17/2017 0742 - Progressing by Lavonna Rua, RN Pain Managment: General experience of comfort will improve 10/17/2017 0742 - Progressing by Lavonna Rua, RN Safety: Ability to remain free from injury will improve 10/17/2017 0742 - Progressing by Lavonna Rua, RN Skin Integrity: Risk for impaired skin integrity will decrease 10/17/2017 0742 - Progressing by Lavonna Rua, RN Education: Ability to demonstrate management of disease process will improve 10/17/2017 0742 - Progressing by Lavonna Rua, RN Ability to verbalize understanding of medication therapies will improve 10/17/2017 0742 - Progressing by Lavonna Rua, RN Activity: Capacity to carry out activities will improve 10/17/2017 0742 - Progressing by Lavonna Rua, RN Cardiac: Ability to achieve and maintain adequate cardiopulmonary perfusion will improve 10/17/2017 0742 - Progressing by Lavonna Rua, RN Education: Understanding of CV disease, CV risk reduction, and recovery process will improve 10/17/2017 0742 - Progressing by Lavonna Rua, RN Activity: Ability to return to baseline activity level will improve 10/17/2017 0742 - Progressing by Lavonna Rua, RN Cardiovascular: Ability to achieve and maintain adequate cardiovascular perfusion  will improve 10/17/2017 0742 - Progressing by Lavonna Rua, RN Vascular access site(s) Level 0-1 will be maintained 10/17/2017 0742 - Progressing by Lavonna Rua, RN Health Behavior/Discharge Planning: Ability to safely manage health-related needs after discharge will improve 10/17/2017 0742 - Progressing by Lavonna Rua, RN

## 2017-10-18 ENCOUNTER — Encounter (HOSPITAL_COMMUNITY): Payer: Self-pay | Admitting: Cardiology

## 2017-10-18 LAB — CBC WITH DIFFERENTIAL/PLATELET
BASOS ABS: 0 10*3/uL (ref 0.0–0.1)
BASOS PCT: 0 %
EOS ABS: 0.1 10*3/uL (ref 0.0–0.7)
EOS PCT: 2 %
HCT: 44.4 % (ref 39.0–52.0)
Hemoglobin: 14.5 g/dL (ref 13.0–17.0)
Lymphocytes Relative: 39 %
Lymphs Abs: 1.3 10*3/uL (ref 0.7–4.0)
MCH: 30.9 pg (ref 26.0–34.0)
MCHC: 32.7 g/dL (ref 30.0–36.0)
MCV: 94.5 fL (ref 78.0–100.0)
MONO ABS: 0.4 10*3/uL (ref 0.1–1.0)
Monocytes Relative: 11 %
NEUTROS PCT: 48 %
Neutro Abs: 1.6 10*3/uL — ABNORMAL LOW (ref 1.7–7.7)
PLATELETS: 105 10*3/uL — AB (ref 150–400)
RBC: 4.7 MIL/uL (ref 4.22–5.81)
RDW: 13.6 % (ref 11.5–15.5)
WBC: 3.3 10*3/uL — AB (ref 4.0–10.5)

## 2017-10-18 LAB — BASIC METABOLIC PANEL
ANION GAP: 7 (ref 5–15)
BUN: 17 mg/dL (ref 6–20)
CHLORIDE: 103 mmol/L (ref 101–111)
CO2: 28 mmol/L (ref 22–32)
Calcium: 8.8 mg/dL — ABNORMAL LOW (ref 8.9–10.3)
Creatinine, Ser: 1.26 mg/dL — ABNORMAL HIGH (ref 0.61–1.24)
GFR calc Af Amer: 60 mL/min — ABNORMAL LOW (ref >60)
GFR calc non Af Amer: 52 mL/min — ABNORMAL LOW (ref >60)
Glucose, Bld: 89 mg/dL (ref 65–99)
POTASSIUM: 3.9 mmol/L (ref 3.5–5.1)
SODIUM: 138 mmol/L (ref 135–145)

## 2017-10-18 MED ORDER — POTASSIUM CHLORIDE CRYS ER 10 MEQ PO TBCR: 10.0000 meq | EXTENDED_RELEASE_TABLET | Freq: Two times a day (BID) | ORAL | 3 refills | Status: DC

## 2017-10-18 MED ORDER — METOPROLOL SUCCINATE ER 200 MG PO TB24: 200.0000 mg | ORAL_TABLET | Freq: Every day | ORAL | 3 refills | Status: DC

## 2017-10-18 MED ORDER — FUROSEMIDE 40 MG PO TABS: 40.0000 mg | ORAL_TABLET | Freq: Two times a day (BID) | ORAL | 3 refills | Status: DC

## 2017-10-18 NOTE — Discharge Summary (Addendum)
Physician Discharge Summary  Patient ID: Ronald [name] MRN: [id number] DOB/AGE: [birth year] 83 y.o.  Admit date: 10/14/2017 Discharge date: 10/18/2017  Admission Diagnoses: Active Problems:   Heart failure, systolic, with acute decompensation ([place])  Discharge Diagnoses:  Acute on chronic systolic and diastolic heart failure Nonischemic cardiomyopathy Recent stress test with no ischemia Persistent atrial fibrillationCHA2DS2VASc score 4, annual risk 4.8% Hypertension CKD 3    Discharged Condition: good  Hospital Course:  Patient was admitted with heart failure decompensation. He was diuresed with IV lasix, switched to PO lasix 48 hrs before discharge. He was net negative 10 L on discharge. He underwent right and left heart cath that showed nonobstructive coronary artery disease, grp II pulmonary hypertension, mildly elevated filling pressures. Cr stayed stable through the hospital stay. I switched his metoprolol tartarate 100 mg bid to succinate 200 mg daily. I increased his PO lasix to 40 mg bid, and Kdur to 10 mEq bid. Of note, he is not taking Bidil and sildenafil, so I have not resumed it on discharge. Continue xarelto, entresto, spironolactone.   Finally, I spoke with EP Dr. Caryl Comes who quoted [name] data to recommedn taht risks of ICD may not outweigh benefits in otherwise stable 83 y/o with persistent Afib and HFrEF.  Patient ambulated on the morning of discharge without any difficutly breathing.  We will discuss Galactic HF trial participation on his follow up visit.   Consults: None  Significant Diagnostic Studies: labs:  Results for Ronald, [name] (MRN [id number]) as of 10/18/2017 08:56  Ref. Range 10/18/2017 78:58  BASIC METABOLIC PANEL Unknown Rpt (A)  Sodium Latest Ref Range: 135 - 145 mmol/L 138  Potassium Latest Ref Range: 3.5 - 5.1 mmol/L 3.9  Chloride Latest Ref Range: 101 - 111 mmol/L 103  CO2 Latest Ref Range: 22 - 32 mmol/L 28  Glucose Latest Ref  Range: 65 - 99 mg/dL 89  BUN Latest Ref Range: 6 - 20 mg/dL 17  Creatinine Latest Ref Range: 0.61 - 1.24 mg/dL 1.26 (H)  Calcium Latest Ref Range: 8.9 - 10.3 mg/dL 8.8 (L)  Anion gap Latest Ref Range: 5 - 15  7  GFR, Est Non African American Latest Ref Range: >60 mL/min 52 (L)  GFR, Est African American Latest Ref Range: >60 mL/min 60 (L)    Treatments: IV diuresis  Discharge Exam: Blood pressure (!) 141/95, pulse 99, temperature 98.2 F (36.8 C), temperature source Oral, resp. rate 18, height 6\' 1"  (1.854 m), weight 91.3 kg (201 lb 3.2 oz), SpO2 100 %. Nursing note and vitals reviewed. Constitutional: He is oriented to person, place, and time. He appears well-developed and well-nourished.  HENT:  Head: Normocephalic and atraumatic.  Neck: JVD improved.  Cardiovascular: Normal rate.  Murmur (II/VI LLSB holosystolic) heard. Irregularly irregular rhythm  Right radial site with no bleeding, hematoma. Good capillary refill.  Respiratory: Effort normal. He has no rales.  GI: Soft. Bowel sounds are normal. He exhibits no distension. There is no tenderness.  Musculoskeletal: He exhibits trace b/l edema  Lymphadenopathy:    He has no cervical adenopathy.  Neurological: He is alert and oriented to person, place, and time. No cranial nerve deficit. Coordination normal.  Skin: Skin is warm and dry.  Psychiatric: He has a normal mood and affect.      Disposition: 01-Home or Self Care   Allergies as of 10/18/2017      Reactions   Lactose Intolerance (gi) Other (See Comments)   GI upset  Medication List    STOP taking these medications   isosorbide-hydrALAZINE 20-37.5 MG tablet Commonly known as:  BIDIL   losartan 25 MG tablet Commonly known as:  COZAAR   metoprolol tartrate 100 MG tablet Commonly known as:  LOPRESSOR     TAKE these medications   clidinium-chlordiazePOXIDE 5-2.5 MG capsule Commonly known as:  LIBRAX Take 1 capsule 1-2 times daily as needed.    ENTRESTO 97-103 MG Generic drug:  sacubitril-valsartan Take 1 tablet by mouth 2 (two) times daily.   furosemide 40 MG tablet Commonly known as:  LASIX Take 1 tablet (40 mg total) by mouth 2 (two) times daily. What changed:  when to take this   Hyoscyamine Sulfate SL 0.125 MG Subl Commonly known as:  LEVSIN/SL Dissolve 1 tablet on the tongue for stomach rumbling/diarrhea.   metoprolol 200 MG 24 hr tablet Commonly known as:  TOPROL-XL Take 1 tablet (200 mg total) by mouth daily. Take with or immediately following a meal.   potassium chloride 10 MEQ tablet Commonly known as:  K-DUR,KLOR-CON Take 1 tablet (10 mEq total) by mouth 2 (two) times daily. What changed:  when to take this   ranitidine 150 MG tablet Commonly known as:  ZANTAC Take 1 tablet (150 mg total) by mouth every morning.   rivaroxaban 20 MG Tabs tablet Commonly known as:  XARELTO Take 20 mg by mouth daily with supper.   spironolactone 50 MG tablet Commonly known as:  ALDACTONE Take 50 mg by mouth daily.      Follow-up Information    Despina Hick, MD Follow up on 10/27/2017.   Specialty:  Cardiology Why:  10:15 AM Contact information: [facility], [address]. [place] [address] [phone]           Signed: Nigel Mormon 10/18/2017, 8:49 AM   Nigel Mormon, MD [facility]. [place] Pager: [phone] Office: [phone] If no answer Cell [phone]

## 2017-10-18 NOTE — Consult Note (Signed)
   [facility] CM Inpatient Consult   10/18/2017  Ronald [name] 1934/04/04 [id number]  Met with the patient and his wife, Joycelyn Schmid regarding [place] Management. He endorses Bing Matter, PA-C is the Primary Care Provider, [facility] in [place]. Patient's wife states he is in the doughnut but the patient states, "I will be fine for the next couple of weeks."  Gave them the brochure, 24 hour nurse advise line with contact information.  Encouraged them to call for any needs.  They verbalized understanding.  Inpatient RNCM did assign HF EMMI calls for follow up.  The patient verbalized that it would be fine.  For questions, please contact:  Natividad Brood, RN BSN [facility] Liaison  [phone] business mobile phone Toll free office [phone]

## 2017-10-18 NOTE — Progress Notes (Signed)
TR band completely off this am, no any complications and bleeding overnight, incision place looks CDI, Tegaderm applied in place

## 2017-10-18 NOTE — Care Management Note (Signed)
Case Management Note  Patient Details  Name: Ronald [name] MRN: [id number] Date of Birth: [birth year]  Subjective/Objective:     CHF              Action/Plan: Patient lives at home with spouse; PCP is Dr Deatra Ina; has private insurance with [employer] with prescription drug coverage; pharmacy of choice is [facility]; he has scales at home and knows to weigh himself daily, no DME in the home, he continues to drive; no needs identified at this time.   Expected Discharge Date:  10/18/17               Expected Discharge Plan:  Home/Self Care  In-House Referral:   [facility]  Discharge planning Services  CM Consult  Status of Service:  In process, will continue to follow  Sherrilyn Rist [phone] 10/18/2017, 10:15 AM

## 2017-10-18 NOTE — Progress Notes (Signed)
At 11 pm, pt's 3 cc remaining air taken off, pt has a very minimal bleeding on the site, but TR band is still in place, will take off in am  Called attending MD regarding Xarelto earlier and hold it until 12/18 am dose.  Pt slept well overnight, vitals stable, denies chest pain and SOB, will continue to monitor the patient

## 2017-10-18 NOTE — Progress Notes (Signed)
Patient and wife given discharge instructions and all questions answered.  

## 2017-10-21 DIAGNOSIS — I5022 Chronic systolic (congestive) heart failure: Secondary | ICD-10-CM | POA: Diagnosis not present

## 2017-10-21 DIAGNOSIS — N183 Chronic kidney disease, stage 3 (moderate): Secondary | ICD-10-CM | POA: Diagnosis not present

## 2017-10-21 DIAGNOSIS — I428 Other cardiomyopathies: Secondary | ICD-10-CM | POA: Diagnosis not present

## 2017-10-21 DIAGNOSIS — I130 Hypertensive heart and chronic kidney disease with heart failure and stage 1 through stage 4 chronic kidney disease, or unspecified chronic kidney disease: Secondary | ICD-10-CM | POA: Diagnosis not present

## 2017-10-27 DIAGNOSIS — I5022 Chronic systolic (congestive) heart failure: Secondary | ICD-10-CM | POA: Diagnosis not present

## 2017-10-27 DIAGNOSIS — I482 Chronic atrial fibrillation: Secondary | ICD-10-CM | POA: Diagnosis not present

## 2017-10-27 DIAGNOSIS — I10 Essential (primary) hypertension: Secondary | ICD-10-CM | POA: Diagnosis not present

## 2017-10-27 DIAGNOSIS — I2510 Atherosclerotic heart disease of native coronary artery without angina pectoris: Secondary | ICD-10-CM | POA: Diagnosis not present

## 2017-11-02 ENCOUNTER — Other Ambulatory Visit: Payer: Self-pay | Admitting: *Deleted

## 2017-11-02 NOTE — Patient Outreach (Signed)
[facility]) Care Management  11/02/2017  Ronald [name] 1934/03/13 [id number]   EMMI- Heart Failure RED ON EMMI ALERT DAY#: 14 DATE:  11/01/17 RED ALERT: New/Worsening Problems? Yes   Outreach attempt # 1 to patient. HIPAA identifiers verified X's 2. Patient confirmed having problems with his right hand after being discharged from the hospital. He stated, his right hand was swollen from the "needle that was stuck in his hand at the hospital". Per patient, "my wife worked with it the best she could". Patient verbalized going to his primary MD's office on 10/31/17. During the visit, he was able to get his hand addressed and rewrapped by a nurse. He stated, the nurse gave his wife some medicine and dressing supplies for his hand. He stated, his hand has improved and he praised he hospital for a job well done.   Patient reported, he received his discharge instructions from the hospital. He acknowledged having his spouse to assist him with understanding the discharge instructions. Patient stated, he is in the donut whole and he was out of some medications. Patient was at the drug store picking up enough medications to last through tomorrow until he receives his check. Patient reported, he weighs daily and his last weight was 187 pounds. Patient stated, his diet excludes sodium, pork and he was not prescribed any fluid restrictions. Patient stated, his legs feel wonderful and they look a lot better than when he went into the hospital. He stated, " I am getting out and driving: I may not answer the cell phone if I am driving, just call back".   Plan: RN CM will notify [facility] Case Management Assistant regarding case closure.  RM CM will send successful outreach letter to patient.    Lake Bells, RN, BSN, MHA/MSL, [place] Telephonic Care Manager Coordinator [facility] Direct Phone: [phone] Cell Phone: [phone] Toll Free: [phone] Fax:  [phone]

## 2017-11-03 ENCOUNTER — Encounter: Payer: Self-pay | Admitting: *Deleted

## 2017-11-04 ENCOUNTER — Other Ambulatory Visit: Payer: Self-pay | Admitting: *Deleted

## 2017-11-04 NOTE — Patient Outreach (Signed)
[facility]) Care Management  11/04/2017  Ronald [name] June 17, 1934 [id number]  EMMI- Heart Failure RED ON EMMI ALERT DAY#: 16 DATE: 11/03/17 RED ALERT: Weight? 184  New/worsening problems? Yes Questions about meds? Yes  Outreach attempt #1 to patient. No answer. RN CM left HIPAA compliant message along with contact info.    Plan: RN CM will contact patient within one week.  Lake Bells, RN, BSN, MHA/MSL, [place] Telephonic Care Manager Coordinator [facility] Direct Phone: [phone] Toll Free: [phone] Fax: [phone]

## 2017-11-04 NOTE — Patient Outreach (Signed)
[facility]) Care Management  11/04/2017  Ronald [name] Nov 10, 1933 [id number]  Outreach telephone call to patient. HIPAA verified with patient. Patient stated, he was eating soup for supper. He reported, "He was doing ok and his weight is stable. He mentioned that "he didn't take his fluid pill today, because he doesn't want to lose too much weight". "He doesn't want to look dried up". Patient wanted to end the phone call because he was eating supper and he didn't want his soup to get cold. He stated, if anything goes wrong, he will contact his doctor. Patient was appreciative of telephone call.   Plan: RN CM will notify [facility] Case Management Assistant regarding case closure.   Lake Bells, RN, BSN, MHA/MSL, [place] Telephonic Care Manager Coordinator [facility] Direct Phone: [phone] Cell Phone: [phone] Toll Free: [phone] Fax: [phone]

## 2017-11-07 ENCOUNTER — Ambulatory Visit: Payer: Self-pay | Admitting: *Deleted

## 2017-11-07 DIAGNOSIS — I5022 Chronic systolic (congestive) heart failure: Secondary | ICD-10-CM | POA: Diagnosis not present

## 2017-11-08 ENCOUNTER — Other Ambulatory Visit: Payer: Self-pay | Admitting: *Deleted

## 2017-11-08 NOTE — Patient Outreach (Signed)
[place] [facility]) Care Management  11/08/2017  Ronald [name] 1934-03-30 [id number]   EMMI- Heart Failure RED ON EMMI ALERT DAY#: 20 DATE: 11/07/17 RED ALERT:   New/worsening problems? Yes    Outreach attempt # 1 to patient. HIPAA verified with patient. EMMI automated questionnaire recorded incorrect response. Patient stated, he is doing well. His last recorded weight was 195 pounds. He continues to eat four meals per day. He continues to limit his sodium and monitor his fluid intake. He stated, he had a primary MD visit on 11/07/17. Per patient, his primary MD is ordering lab work to be completed. Patient verbalized, he doesn't understand why we are checking on him every day. RN CM explained to patient about receiving abnormal responses from [facility] telephone call triggers a live call from a nurse. Patient stated, he gets more calls than when he was a patient in the hospital. Patient was appreciative of f/u call.  Plan: RN CM will notify [facility] Case Management Assistant regarding case closure.  RN CM advised patient to alert MD for any changes in conditions.   Lake Bells, RN, BSN, MHA/MSL, [place] Telephonic Care Manager Coordinator [facility] Direct Phone: [phone] Toll Free: [phone] Fax: [phone]

## 2017-11-09 ENCOUNTER — Ambulatory Visit: Payer: Self-pay | Admitting: *Deleted

## 2017-12-05 DIAGNOSIS — I482 Chronic atrial fibrillation: Secondary | ICD-10-CM | POA: Diagnosis not present

## 2017-12-05 DIAGNOSIS — I2510 Atherosclerotic heart disease of native coronary artery without angina pectoris: Secondary | ICD-10-CM | POA: Diagnosis not present

## 2017-12-05 DIAGNOSIS — I10 Essential (primary) hypertension: Secondary | ICD-10-CM | POA: Diagnosis not present

## 2017-12-05 DIAGNOSIS — I5022 Chronic systolic (congestive) heart failure: Secondary | ICD-10-CM | POA: Diagnosis not present

## 2018-01-13 DIAGNOSIS — J309 Allergic rhinitis, unspecified: Secondary | ICD-10-CM | POA: Diagnosis not present

## 2018-01-13 DIAGNOSIS — J22 Unspecified acute lower respiratory infection: Secondary | ICD-10-CM | POA: Diagnosis not present

## 2018-01-17 ENCOUNTER — Encounter: Payer: Self-pay | Admitting: Physician Assistant

## 2018-01-17 ENCOUNTER — Other Ambulatory Visit (INDEPENDENT_AMBULATORY_CARE_PROVIDER_SITE_OTHER): Payer: Medicare HMO

## 2018-01-17 ENCOUNTER — Ambulatory Visit (INDEPENDENT_AMBULATORY_CARE_PROVIDER_SITE_OTHER): Payer: Medicare HMO | Admitting: Physician Assistant

## 2018-01-17 VITALS — BP 118/78 | HR 72 | Ht 71.75 in | Wt 198.5 lb

## 2018-01-17 DIAGNOSIS — R194 Change in bowel habit: Secondary | ICD-10-CM | POA: Diagnosis not present

## 2018-01-17 DIAGNOSIS — K580 Irritable bowel syndrome with diarrhea: Secondary | ICD-10-CM | POA: Diagnosis not present

## 2018-01-17 DIAGNOSIS — K589 Irritable bowel syndrome without diarrhea: Secondary | ICD-10-CM

## 2018-01-17 LAB — CBC WITH DIFFERENTIAL/PLATELET
BASOS PCT: 1 % (ref 0.0–3.0)
Basophils Absolute: 0 10*3/uL (ref 0.0–0.1)
EOS PCT: 3.7 % (ref 0.0–5.0)
Eosinophils Absolute: 0.1 10*3/uL (ref 0.0–0.7)
HEMATOCRIT: 41.4 % (ref 39.0–52.0)
Hemoglobin: 13.7 g/dL (ref 13.0–17.0)
LYMPHS ABS: 1 10*3/uL (ref 0.7–4.0)
LYMPHS PCT: 35.5 % (ref 12.0–46.0)
MCHC: 33.1 g/dL (ref 30.0–36.0)
MCV: 94.4 fl (ref 78.0–100.0)
MONOS PCT: 11.1 % (ref 3.0–12.0)
Monocytes Absolute: 0.3 10*3/uL (ref 0.1–1.0)
NEUTROS ABS: 1.3 10*3/uL — AB (ref 1.4–7.7)
NEUTROS PCT: 48.7 % (ref 43.0–77.0)
PLATELETS: 119 10*3/uL — AB (ref 150.0–400.0)
RBC: 4.39 Mil/uL (ref 4.22–5.81)
RDW: 13.4 % (ref 11.5–15.5)
WBC: 2.8 10*3/uL — ABNORMAL LOW (ref 4.0–10.5)

## 2018-01-17 NOTE — Patient Instructions (Signed)
If you are age 82 or older, your body mass index should be between 23-30. Your Body mass index is 27.11 kg/m. If this is out of the aforementioned range listed, please consider follow up with your Primary Care Provider.  If you are age 19 or younger, your body mass index should be between 19-25. Your Body mass index is 27.11 kg/m. If this is out of the aformentioned range listed, please consider follow up with your Primary Care Provider.   Your physician has requested that you go to the basement for the following lab work before leaving today: CBC

## 2018-01-17 NOTE — Progress Notes (Addendum)
Chief Complaint: Change in bowel habits, history of IBS  HPI:    Ronald [name] is an 84 year old African-American male with a past medical history of IBS and other conditions listed below including A. fib on Xarelto, who follows with Dr. Hilarie Fredrickson and presents to clinic today to discuss his change in bowel habits.      Last OV 08/31/17 with intermittent episodes of urgency and diarrhea.  Thought Zantac made these worse.  Given Levsin sublingual when necessary at onset of any urgency/abdominal vomiting/diarrhea.    Today, patient is a very poor historian, begins by describing what a good doctor Dr. Sharlett Iles was and all the procedures he used to have including EGDs and colonoscopies and CTs and he hasn't had these in a long time.  Currently, patient tells me about a month ago he was having an increase in some abdominal discomfort and loose stools, but over the past couple weeks these have returned to normal.  He vaguely describes some upper respiratory symptoms for which he was taking over-the-counter cold medications.  Currently, slight increase in gas and does continue with some loose stools if he "eats the wrong thing", but overall is not complaining today.  Does explain some "black specks" in his stools lately which he is worried are blood.    Denies fever, chills, bright red blood in his stool, melena, weight loss, anorexia, nausea, vomiting, shortness of breath, palpitations or increasing fatigue.  Past Medical History:  Diagnosis Date  . Allergic rhinitis, cause unspecified   . BPH (benign prostatic hypertrophy)   . Essential hypertension, benign   . GERD (gastroesophageal reflux disease)   . Glaucoma   . Hepatic cyst   . Hiatal hernia 2014  . History of gout   . Hyperlipidemia   . Irritable bowel syndrome   . Lactose intolerance   . Personal history of colonic polyps 03/04/1992   adenomatous polyp  . Pneumonia    "~ 5 times in my lifetime; last time was 03/2014" (10/14/2017)  . Reflux  esophagitis   . Schatzki's ring 2014  . Sleep apnea   . Vitamin B12 deficiency     Past Surgical History:  Procedure Laterality Date  . CATARACT EXTRACTION W/ INTRAOCULAR LENS IMPLANT Left   . HYDROCELE EXCISION Bilateral 10/2016   Archie Endo 10/11/2016  . INTRAVASCULAR PRESSURE WIRE/FFR STUDY N/A 10/17/2017   Procedure: INTRAVASCULAR PRESSURE WIRE/FFR STUDY;  Surgeon: Nigel Mormon, MD;  Location: [facility];  Service: Cardiovascular;  Laterality: N/A;  . NASAL RECONSTRUCTION     "tried to fix my nose so I can smell; still can't smell" (10/14/2017)  . RIGHT/LEFT HEART CATH AND CORONARY ANGIOGRAPHY N/A 10/17/2017   Procedure: RIGHT/LEFT HEART CATH AND CORONARY ANGIOGRAPHY;  Surgeon: Nigel Mormon, MD;  Location: [facility];  Service: Cardiovascular;  Laterality: N/A;  . TRANSURETHRAL RESECTION OF PROSTATE  10/2007   Archie Endo 08/22/2007  . ULTRASOUND GUIDANCE FOR VASCULAR ACCESS  10/17/2017   Procedure: Ultrasound Guidance For Vascular Access;  Surgeon: Nigel Mormon, MD;  Location: [facility];  Service: Cardiovascular;;    Current Outpatient Medications  Medication Sig Dispense Refill  . clidinium-chlordiazePOXIDE (LIBRAX) 5-2.5 MG per capsule Take 1 capsule 1-2 times daily as needed. 60 capsule 0  . furosemide (LASIX) 40 MG tablet Take 1 tablet (40 mg total) by mouth 2 (two) times daily. 60 tablet 3  . Hyoscyamine Sulfate SL (LEVSIN/SL) 0.125 MG SUBL Dissolve 1 tablet on the tongue for stomach  rumbling/diarrhea. 30 each 3  . metoprolol succinate (TOPROL-XL) 200 MG 24 hr tablet Take 1 tablet (200 mg total) by mouth daily. Take with or immediately following a meal. 60 tablet 3  . potassium chloride (K-DUR,KLOR-CON) 10 MEQ tablet Take 1 tablet (10 mEq total) by mouth 2 (two) times daily. 60 tablet 3  . ranitidine (ZANTAC) 150 MG tablet Take 1 tablet (150 mg total) by mouth every morning. 30 tablet 3  . rivaroxaban (XARELTO) 20 MG TABS tablet Take 20  mg by mouth daily with supper.    . sacubitril-valsartan (ENTRESTO) 97-103 MG Take 1 tablet by mouth 2 (two) times daily.    [name] spironolactone (ALDACTONE) 50 MG tablet Take 50 mg by mouth daily.     Current Facility-Administered Medications  Medication Dose Route Frequency Provider Last Rate Last Dose  . cyanocobalamin ((VITAMIN B-12)) injection 1,000 mcg  1,000 mcg Intramuscular Q30 days Sable Feil, MD   1,000 mcg at 12/04/12 9485    Allergies as of 01/17/2018 - Review Complete 01/17/2018  Allergen Reaction Noted  . Lactose intolerance (gi) Other (See Comments) 03/24/2014    Family History  Problem Relation Age of Onset  . Colon cancer Neg Hx   . Esophageal cancer Neg Hx   . Diabetes Neg Hx   . Heart disease Neg Hx     Social History   Socioeconomic History  . Marital status: Married    Spouse name: Not on file  . Number of children: Not on file  . Years of education: Not on file  . Highest education level: Not on file  Social Needs  . Financial resource strain: Not on file  . Food insecurity - worry: Not on file  . Food insecurity - inability: Not on file  . Transportation needs - medical: Not on file  . Transportation needs - non-medical: Not on file  Occupational History  . Not on file  Tobacco Use  . Smoking status: Former Smoker    Packs/day: 1.00    Years: 30.00    Pack years: 30.00    Types: Cigarettes    Last attempt to quit: 11/02/1983    Years since quitting: 34.2  . Smokeless tobacco: Never Used  Substance and Sexual Activity  . Alcohol use: No    Frequency: Never  . Drug use: No  . Sexual activity: Not on file  Other Topics Concern  . Not on file  Social History Narrative  . Not on file    Review of Systems:    Constitutional: No weight loss, fever or chills Cardiovascular: No chest pain Respiratory: No SOB  Gastrointestinal: See HPI and otherwise negative   Physical Exam:  Vital signs: BP 118/78 (BP Location: Left Arm, Patient  Position: Sitting, Cuff Size: Normal)   Pulse 72   Ht 5' 11.75" (1.822 m) Comment: height measured without shoes  Wt 198 lb 8 oz (90 kg)   BMI 27.11 kg/m   Constitutional:   Pleasant AA male appears to be in NAD, Well developed, Well nourished, alert and cooperative Respiratory: Respirations even and unlabored. Lungs clear to auscultation bilaterally.   No wheezes, crackles, or rhonchi.  Cardiovascular: Normal S1, S2. No MRG. Regular rate and rhythm. No peripheral edema, cyanosis or pallor.  Gastrointestinal:  Soft, nondistended, nontender. No rebound or guarding. Normal bowel sounds. No appreciable masses or hepatomegaly. Psychiatric: Demonstrates good judgement and reason without abnormal affect or behaviors.  MOST RECENT LABS AND IMAGING: CBC    Component Value  Date/Time   WBC 3.3 (L) 10/18/2017 0642   RBC 4.70 10/18/2017 0642   HGB 14.5 10/18/2017 0642   HCT 44.4 10/18/2017 0642   PLT 105 (L) 10/18/2017 0642   MCV 94.5 10/18/2017 0642   MCH 30.9 10/18/2017 0642   MCHC 32.7 10/18/2017 0642   RDW 13.6 10/18/2017 0642   LYMPHSABS 1.3 10/18/2017 0642   MONOABS 0.4 10/18/2017 0642   EOSABS 0.1 10/18/2017 0642   BASOSABS 0.0 10/18/2017 0642    CMP     Component Value Date/Time   NA 138 10/18/2017 0642   K 3.9 10/18/2017 0642   CL 103 10/18/2017 0642   CO2 28 10/18/2017 0642   GLUCOSE 89 10/18/2017 0642   BUN 17 10/18/2017 0642   CREATININE 1.26 (H) 10/18/2017 0642   CALCIUM 8.8 (L) 10/18/2017 0642   PROT 5.8 (L) 10/14/2017 1434   ALBUMIN 3.3 (L) 10/14/2017 1434   AST 24 10/14/2017 1434   ALT 10 (L) 10/14/2017 1434   ALKPHOS 64 10/14/2017 1434   BILITOT 1.8 (H) 10/14/2017 1434   GFRNONAA 52 (L) 10/18/2017 0642   GFRAA 60 (L) 10/18/2017 4967    Assessment: 1.  IBS-D: Long history of symptoms with intermittent abdominal cramping, gas and loose stools, mostly under control today 2.  Change in bowel habits: Describing little black flecks, worried these are blood,  likely undigested food, though we will check CBC today to ensure no anemia  Plan: 1.  Ordered CBC today for further evaluation of possible anemia.  If normal, no further recommendations. 2.  Encouraged patient to continue what he is doing as he is feeling well today. 3.  Did go ahead and schedule patient with Dr. Hilarie Fredrickson in the next 6-8 weeks as he has never been seen by Dr. Hilarie Fredrickson before and when he calls in urgently typically get scheduled with an APP.  He did discuss this today and would like to see the doctor.  Ellouise Newer, PA-C [facility] 01/17/2018, 11:06 AM  Cc: Aletha Halim., PA-C   Addendum: Reviewed and agree with initial management. Pyrtle, Lajuan Lines, MD

## 2018-01-22 IMAGING — US US ABDOMEN COMPLETE
1 series · 13 of 25 positions shown · non-contrast
Comparison: Abdominal ultrasound February 08, 2013

CLINICAL DATA: Four months of abdominal pain. History of
hypertension.

EXAM:
ABDOMEN ULTRASOUND COMPLETE

[Series 1: us abdomen complete · 0.22mm/px · 13 of 110 slices shown]
[im 1/110]
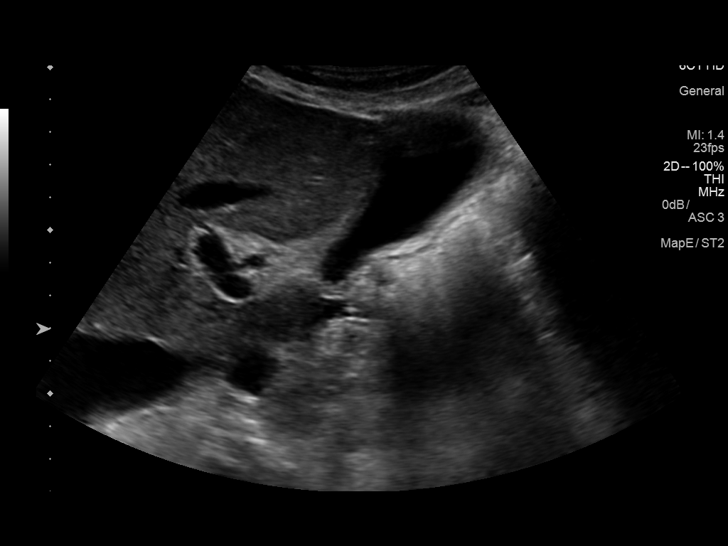
[im 10/110]
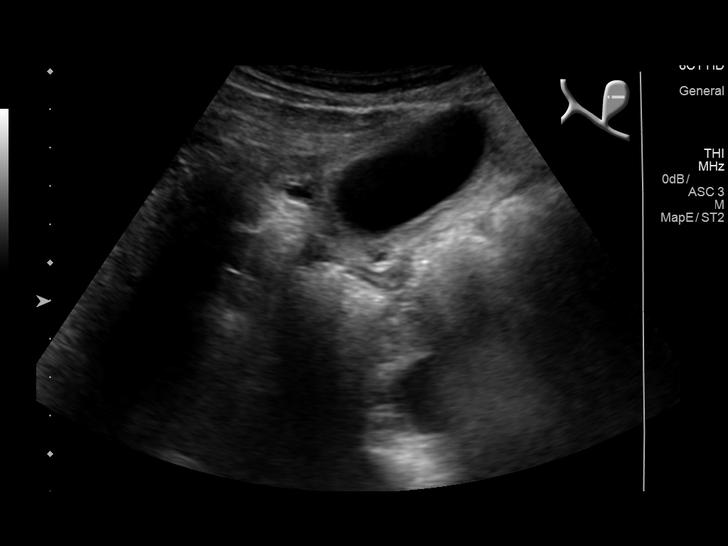
[im 19/110]
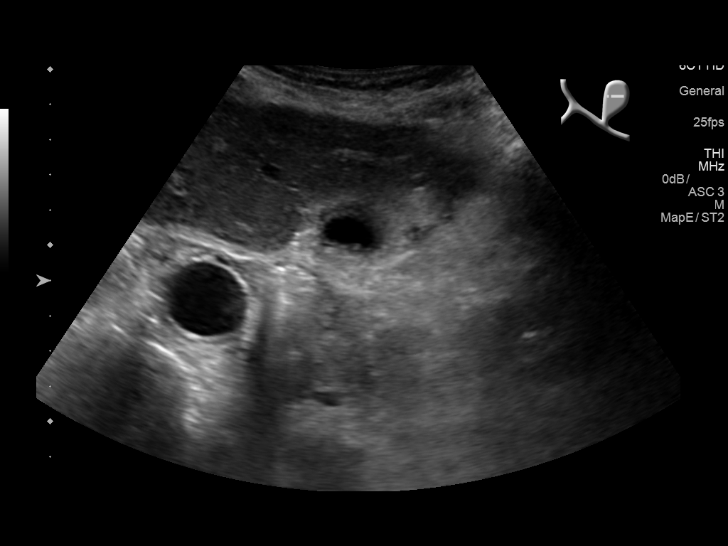
[im 28/110]
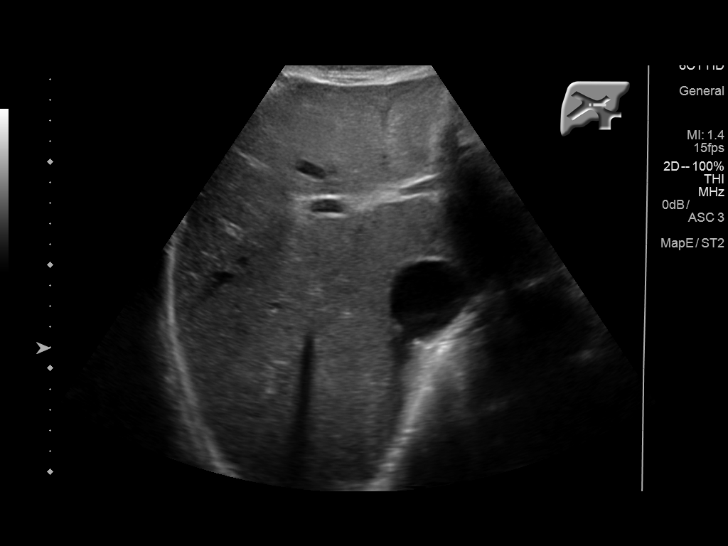
[im 37/110]
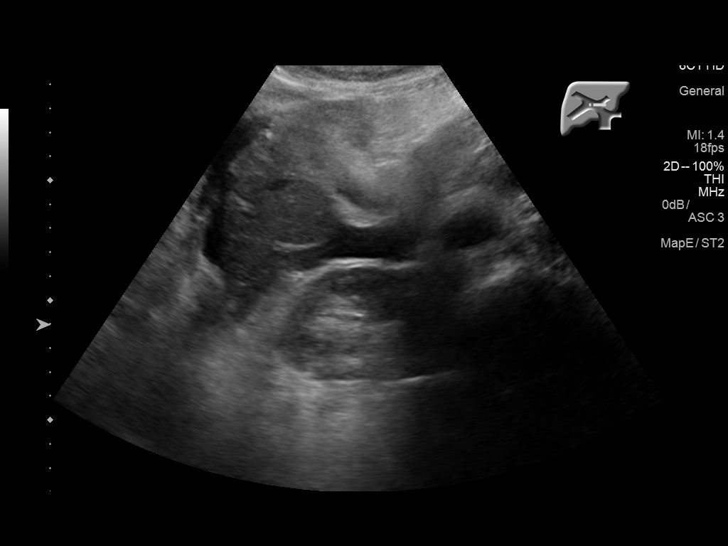
[im 46/110]
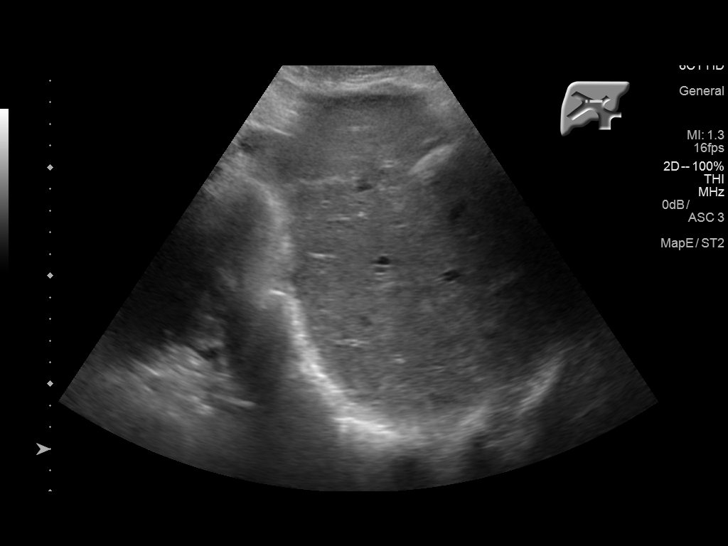
[im 55/110]
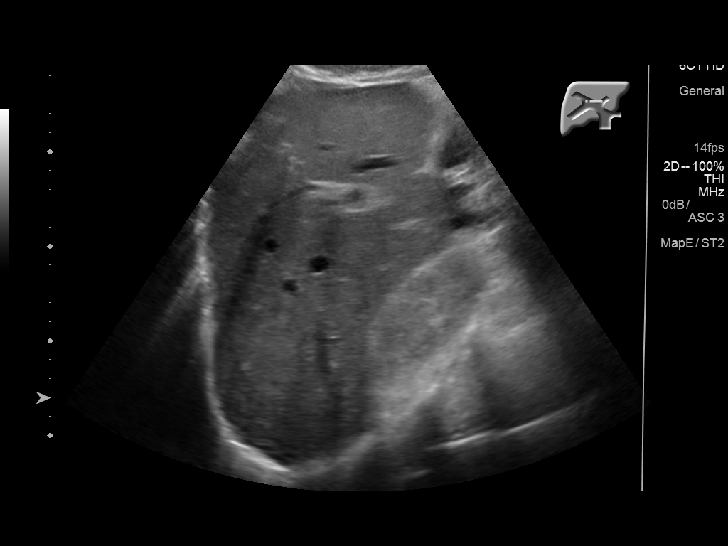
[im 64/110]
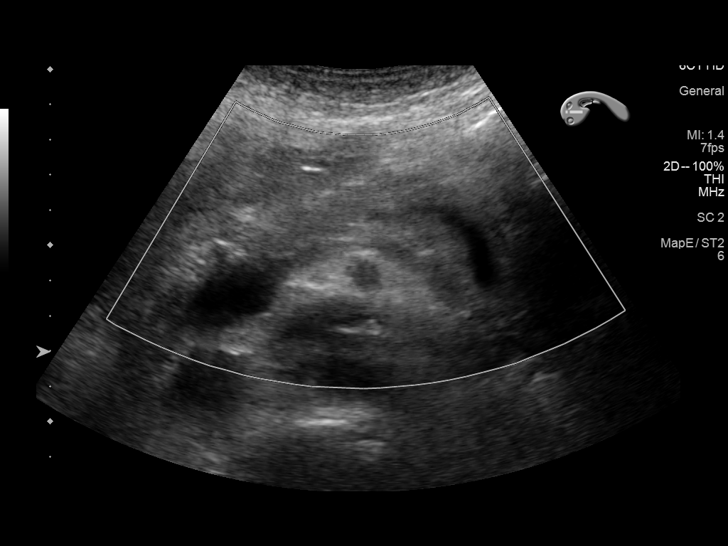
[im 73/110]
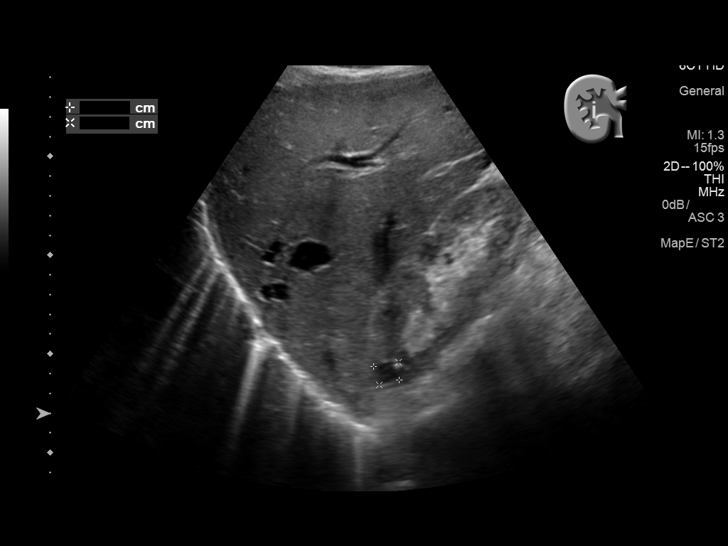
[im 82/110]
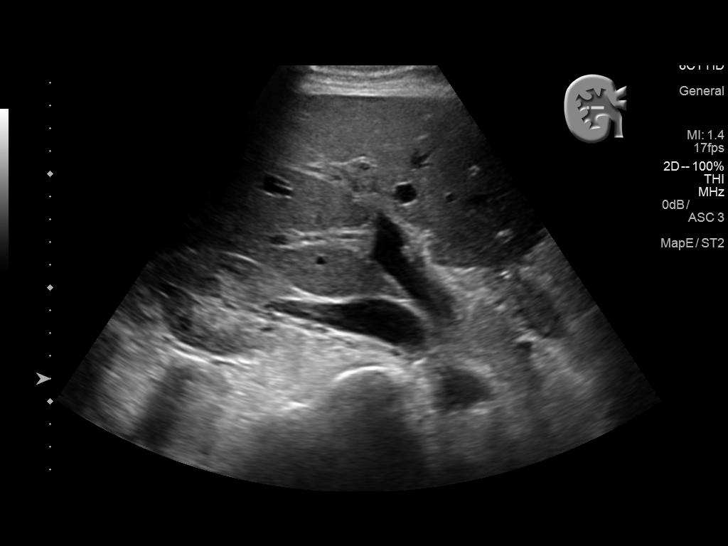
[im 91/110]
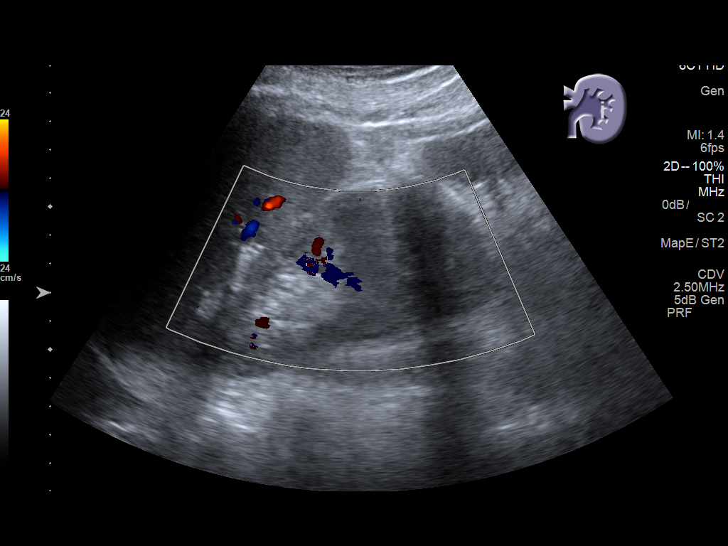
[im 100/110]
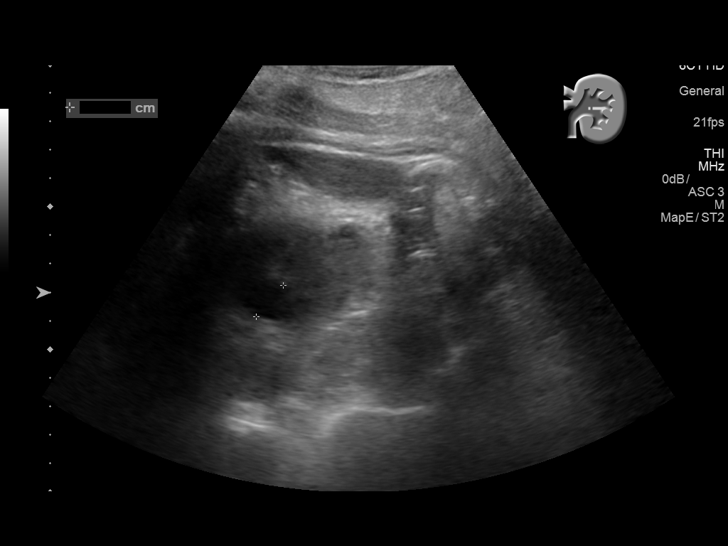
[im 110/110]
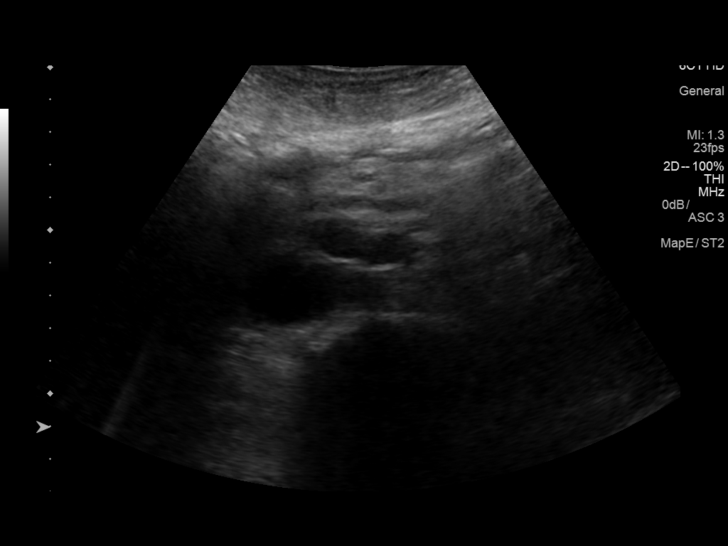

[13 of 25 positions shown; findings below may reference images not displayed]

FINDINGS: Gallbladder: The gallbladder is adequately distended. There is an
echogenic non mobile nonshadowing 4 mm focus compatible with a
polyp. No shadowing stones or sludge are observed. There is no
pericholecystic fluid or positive sonographic Murphy's sign. The
gallbladder wall is borderline thickened at 3.5 mm.

Common bile duct: Diameter: 1.7 mm

Liver: The hepatic echotexture is normal. There is a cyst in the
right hepatic lobe measuring 2.4 x 2 x 1.8 cm. This has been
previously demonstrated and appears slightly larger. There is no
intrahepatic ductal dilation.

IVC: No abnormality visualized.

Pancreas: Visualized portion unremarkable.

Spleen: Size and appearance within normal limits.

Right Kidney: Length: 12.4 cm. The renal cortical echotexture is
approximately equal to that of the liver. There is an upper pole
cyst measuring 1.5 x 1.5 x 0.8 cm. There is no hydronephrosis.

Left Kidney: Length: 11.2 cm. The renal cortical echotexture is
similar to that on the right. There is a lower pole cystic structure
measuring 1.9 x 1.7 x 1.5 cm. There is no hydronephrosis.

Abdominal aorta: No aneurysm visualized.

Other findings: There is a small amount of ascites.
IMPRESSION: Mild gallbladder wall thickening. 4 mm gallbladder polyp. No
sonographic evidence of gallstones or acute cholecystitis. If
chronic gallbladder dysfunction is suspected clinically, a nuclear
medicine hepatobiliary scan with gallbladder ejection fraction
determination may be useful.

Right lobe hepatic cysts which has increased in size and now
measures 2.4 x 2 x 1.8 cm. No discrete septation is observed today.

Increased renal cortical echotexture bilaterally compatible with
medical renal disease. Cysts in both kidneys which have been
previously demonstrated.

## 2018-02-06 DIAGNOSIS — I5022 Chronic systolic (congestive) heart failure: Secondary | ICD-10-CM | POA: Diagnosis not present

## 2018-02-06 DIAGNOSIS — I2510 Atherosclerotic heart disease of native coronary artery without angina pectoris: Secondary | ICD-10-CM | POA: Diagnosis not present

## 2018-02-06 DIAGNOSIS — I10 Essential (primary) hypertension: Secondary | ICD-10-CM | POA: Diagnosis not present

## 2018-02-06 DIAGNOSIS — I482 Chronic atrial fibrillation: Secondary | ICD-10-CM | POA: Diagnosis not present

## 2018-03-14 DIAGNOSIS — J309 Allergic rhinitis, unspecified: Secondary | ICD-10-CM | POA: Diagnosis not present

## 2018-03-22 ENCOUNTER — Telehealth: Payer: Self-pay | Admitting: Internal Medicine

## 2018-03-22 ENCOUNTER — Encounter: Payer: Self-pay | Admitting: Internal Medicine

## 2018-03-22 NOTE — Telephone Encounter (Signed)
Referral from [place], [place] for a dx of leukopenia. Pt has been scheduled for the pt to see Dr. Julien Nordmann on 6/10 at 1130am. Letter with directions mailed to the pt.

## 2018-04-04 ENCOUNTER — Ambulatory Visit: Payer: Medicare HMO | Admitting: Internal Medicine

## 2018-04-10 ENCOUNTER — Inpatient Hospital Stay: Payer: Medicare HMO | Admitting: Internal Medicine

## 2018-04-10 DIAGNOSIS — I2510 Atherosclerotic heart disease of native coronary artery without angina pectoris: Secondary | ICD-10-CM | POA: Diagnosis not present

## 2018-04-10 DIAGNOSIS — I5022 Chronic systolic (congestive) heart failure: Secondary | ICD-10-CM | POA: Diagnosis not present

## 2018-04-10 DIAGNOSIS — I10 Essential (primary) hypertension: Secondary | ICD-10-CM | POA: Diagnosis not present

## 2018-04-10 DIAGNOSIS — I482 Chronic atrial fibrillation: Secondary | ICD-10-CM | POA: Diagnosis not present

## 2018-04-12 ENCOUNTER — Telehealth: Payer: Self-pay | Admitting: Internal Medicine

## 2018-04-12 NOTE — Telephone Encounter (Signed)
Pt's wife called to reschedule her husband's hematology appt. She stated that the pt had an appt with another physician at the same time on 6/10. Pt has been rescheduled to see Dr. Julien Nordmann on 6/19 at 1130am.

## 2018-04-19 ENCOUNTER — Inpatient Hospital Stay: Payer: Medicare HMO | Attending: Internal Medicine | Admitting: Internal Medicine

## 2018-05-10 DIAGNOSIS — J309 Allergic rhinitis, unspecified: Secondary | ICD-10-CM | POA: Diagnosis not present

## 2018-05-10 DIAGNOSIS — I509 Heart failure, unspecified: Secondary | ICD-10-CM | POA: Diagnosis not present

## 2018-05-10 DIAGNOSIS — I110 Hypertensive heart disease with heart failure: Secondary | ICD-10-CM | POA: Diagnosis not present

## 2018-05-10 DIAGNOSIS — I4891 Unspecified atrial fibrillation: Secondary | ICD-10-CM | POA: Diagnosis not present

## 2018-05-10 DIAGNOSIS — Z87891 Personal history of nicotine dependence: Secondary | ICD-10-CM | POA: Diagnosis not present

## 2018-05-10 DIAGNOSIS — G3184 Mild cognitive impairment, so stated: Secondary | ICD-10-CM | POA: Diagnosis not present

## 2018-06-26 DIAGNOSIS — R69 Illness, unspecified: Secondary | ICD-10-CM | POA: Diagnosis not present

## 2018-06-27 DIAGNOSIS — H25011 Cortical age-related cataract, right eye: Secondary | ICD-10-CM | POA: Diagnosis not present

## 2018-06-27 DIAGNOSIS — H11153 Pinguecula, bilateral: Secondary | ICD-10-CM | POA: Diagnosis not present

## 2018-06-27 DIAGNOSIS — H18413 Arcus senilis, bilateral: Secondary | ICD-10-CM | POA: Diagnosis not present

## 2018-06-27 DIAGNOSIS — H47233 Glaucomatous optic atrophy, bilateral: Secondary | ICD-10-CM | POA: Diagnosis not present

## 2018-06-27 DIAGNOSIS — Z961 Presence of intraocular lens: Secondary | ICD-10-CM | POA: Diagnosis not present

## 2018-06-27 DIAGNOSIS — H52223 Regular astigmatism, bilateral: Secondary | ICD-10-CM | POA: Diagnosis not present

## 2018-06-27 DIAGNOSIS — H401134 Primary open-angle glaucoma, bilateral, indeterminate stage: Secondary | ICD-10-CM | POA: Diagnosis not present

## 2018-06-27 DIAGNOSIS — H5203 Hypermetropia, bilateral: Secondary | ICD-10-CM | POA: Diagnosis not present

## 2018-06-27 DIAGNOSIS — H04123 Dry eye syndrome of bilateral lacrimal glands: Secondary | ICD-10-CM | POA: Diagnosis not present

## 2018-06-27 DIAGNOSIS — H2511 Age-related nuclear cataract, right eye: Secondary | ICD-10-CM | POA: Diagnosis not present

## 2018-07-10 DIAGNOSIS — I10 Essential (primary) hypertension: Secondary | ICD-10-CM | POA: Diagnosis not present

## 2018-07-10 DIAGNOSIS — I482 Chronic atrial fibrillation: Secondary | ICD-10-CM | POA: Diagnosis not present

## 2018-07-10 DIAGNOSIS — I5022 Chronic systolic (congestive) heart failure: Secondary | ICD-10-CM | POA: Diagnosis not present

## 2018-07-10 DIAGNOSIS — I2510 Atherosclerotic heart disease of native coronary artery without angina pectoris: Secondary | ICD-10-CM | POA: Diagnosis not present

## 2018-07-24 DIAGNOSIS — H401133 Primary open-angle glaucoma, bilateral, severe stage: Secondary | ICD-10-CM | POA: Diagnosis not present

## 2018-07-24 DIAGNOSIS — Z961 Presence of intraocular lens: Secondary | ICD-10-CM | POA: Diagnosis not present

## 2018-07-24 DIAGNOSIS — H25811 Combined forms of age-related cataract, right eye: Secondary | ICD-10-CM | POA: Diagnosis not present

## 2018-08-06 DIAGNOSIS — H2511 Age-related nuclear cataract, right eye: Secondary | ICD-10-CM | POA: Diagnosis not present

## 2018-08-10 DIAGNOSIS — H2511 Age-related nuclear cataract, right eye: Secondary | ICD-10-CM | POA: Diagnosis not present

## 2018-08-10 DIAGNOSIS — H2512 Age-related nuclear cataract, left eye: Secondary | ICD-10-CM | POA: Diagnosis not present

## 2018-08-10 DIAGNOSIS — H401113 Primary open-angle glaucoma, right eye, severe stage: Secondary | ICD-10-CM | POA: Diagnosis not present

## 2018-10-09 DIAGNOSIS — I482 Chronic atrial fibrillation, unspecified: Secondary | ICD-10-CM | POA: Diagnosis not present

## 2018-10-09 DIAGNOSIS — I5022 Chronic systolic (congestive) heart failure: Secondary | ICD-10-CM | POA: Diagnosis not present

## 2018-10-09 DIAGNOSIS — I10 Essential (primary) hypertension: Secondary | ICD-10-CM | POA: Diagnosis not present

## 2018-10-09 DIAGNOSIS — I2510 Atherosclerotic heart disease of native coronary artery without angina pectoris: Secondary | ICD-10-CM | POA: Diagnosis not present

## 2018-10-23 DIAGNOSIS — I482 Chronic atrial fibrillation, unspecified: Secondary | ICD-10-CM | POA: Diagnosis not present

## 2018-10-31 ENCOUNTER — Encounter: Payer: Self-pay | Admitting: Podiatry

## 2018-10-31 ENCOUNTER — Ambulatory Visit: Payer: Medicare HMO | Admitting: Podiatry

## 2018-10-31 DIAGNOSIS — Z9229 Personal history of other drug therapy: Secondary | ICD-10-CM

## 2018-10-31 DIAGNOSIS — B351 Tinea unguium: Secondary | ICD-10-CM

## 2018-10-31 DIAGNOSIS — L84 Corns and callosities: Secondary | ICD-10-CM

## 2018-10-31 DIAGNOSIS — M79676 Pain in unspecified toe(s): Secondary | ICD-10-CM

## 2018-10-31 NOTE — Patient Instructions (Addendum)
Onychomycosis/Fungal Toenails  WHAT IS IT? An infection that lies within the keratin of your nail plate that is caused by a fungus.  WHY ME? Fungal infections affect all ages, sexes, races, and creeds.  There may be many factors that predispose you to a fungal infection such as age, coexisting medical conditions such as diabetes, or an autoimmune disease; stress, medications, fatigue, genetics, etc.  Bottom line: fungus thrives in a warm, moist environment and your shoes offer such a location.  IS IT CONTAGIOUS? Theoretically, yes.  You do not want to share shoes, nail clippers or files with someone who has fungal toenails.  Walking around barefoot in the same room or sleeping in the same bed is unlikely to transfer the organism.  It is important to realize, however, that fungus can spread easily from one nail to the next on the same foot.  HOW DO WE TREAT THIS?  There are several ways to treat this condition.  Treatment may depend on many factors such as age, medications, pregnancy, liver and kidney conditions, etc.  It is best to ask your doctor which options are available to you.  1. No treatment.   Unlike many other medical concerns, you can live with this condition.  However for many people this can be a painful condition and may lead to ingrown toenails or a bacterial infection.  It is recommended that you keep the nails cut short to help reduce the amount of fungal nail. 2. Topical treatment.  These range from herbal remedies to prescription strength nail lacquers.  About 40-50% effective, topicals require twice daily application for approximately 9 to 12 months or until an entirely new nail has grown out.  The most effective topicals are medical grade medications available through physicians offices. 3. Oral antifungal medications.  With an 80-90% cure rate, the most common oral medication requires 3 to 4 months of therapy and stays in your system for a year as the new nail grows out.  Oral  antifungal medications do require blood work to make sure it is a safe drug for you.  A liver function panel will be performed prior to starting the medication and after the first month of treatment.  It is important to have the blood work performed to avoid any harmful side effects.  In general, this medication safe but blood work is required. 4. Laser Therapy.  This treatment is performed by applying a specialized laser to the affected nail plate.  This therapy is noninvasive, fast, and non-painful.  It is not covered by insurance and is therefore, out of pocket.  The results have been very good with a 80-95% cure rate.  The [facility] is the only practice in the area to offer this therapy. 5. Permanent Nail Avulsion.  Removing the entire nail so that a new nail will not grow back.  Corns and Calluses Corns are small areas of thickened skin that occur on the top, sides, or tip of a toe. They contain a cone-shaped core with a point that can press on a nerve below. This causes pain.  Calluses are areas of thickened skin that can occur anywhere on the body, including the hands, fingers, palms, soles of the feet, and heels. Calluses are usually larger than corns. What are the causes? Corns and calluses are caused by rubbing (friction) or pressure, such as from shoes that are too tight or do not fit properly. What increases the risk? Corns are more likely to develop in people   who have misshapen toes (toe deformities), such as hammer toes. Calluses can occur with friction to any area of the skin. They are more likely to develop in people who:  Work with their hands.  Wear shoes that fit poorly, are too tight, or are high-heeled.  Have toe deformities. What are the signs or symptoms? Symptoms of a corn or callus include:  A hard growth on the skin.  Pain or tenderness under the skin.  Redness and swelling.  Increased discomfort while wearing tight-fitting shoes, if your feet are  affected. If a corn or callus becomes infected, symptoms may include:  Redness and swelling that gets worse.  Pain.  Fluid, blood, or pus draining from the corn or callus. How is this diagnosed? Corns and calluses may be diagnosed based on your symptoms, your medical history, and a physical exam. How is this treated? Treatment for corns and calluses may include:  Removing the cause of the friction or pressure. This may involve: ? Changing your shoes. ? Wearing shoe inserts (orthotics) or other protective layers in your shoes, such as a corn pad. ? Wearing gloves.  Applying medicine to the skin (topical medicine) to help soften skin in the hardened, thickened areas.  Removing layers of dead skin with a file to reduce the size of the corn or callus.  Removing the corn or callus with a scalpel or laser.  Taking antibiotic medicines, if your corn or callus is infected.  Having surgery, if a toe deformity is the cause. Follow these instructions at home:   Take over-the-counter and prescription medicines only as told by your health care provider.  If you were prescribed an antibiotic, take it as told by your health care provider. Do not stop taking it even if your condition starts to improve.  Wear shoes that fit well. Avoid wearing high-heeled shoes and shoes that are too tight or too loose.  Wear any padding, protective layers, gloves, or orthotics as told by your health care provider.  Soak your hands or feet and then use a file or pumice stone to soften your corn or callus. Do this as told by your health care provider.  Check your corn or callus every day for symptoms of infection. Contact a health care provider if you:  Notice that your symptoms do not improve with treatment.  Have redness or swelling that gets worse.  Notice that your corn or callus becomes painful.  Have fluid, blood, or pus coming from your corn or callus.  Have new symptoms. Summary  Corns are  small areas of thickened skin that occur on the top, sides, or tip of a toe.  Calluses are areas of thickened skin that can occur anywhere on the body, including the hands, fingers, palms, and soles of the feet. Calluses are usually larger than corns.  Corns and calluses are caused by rubbing (friction) or pressure, such as from shoes that are too tight or do not fit properly.  Treatment may include wearing any padding, protective layers, gloves, or orthotics as told by your health care provider. This information is not intended to replace advice given to you by your health care provider. Make sure you discuss any questions you have with your health care provider. Document Released: 07/24/2004 Document Revised: 08/31/2017 Document Reviewed: 08/31/2017 Elsevier Interactive Patient Education  2019 [employer] Inc[employer]  

## 2018-11-26 NOTE — Progress Notes (Signed)
Subjective:  Patient presents to clinic with cc of  painful, thick, discolored, elongated toenails 1-5 b/l that become tender and cannot cut because of thickness.  Kaplan, Kristen W., PA-C is his PCP.   Current Outpatient Medications:  .  cetirizine (ZYRTEC) 10 MG tablet, TAKE 1 TABLET BY MOUTH EVERY DAY, Disp: , Rfl:  .  docusate sodium (COLACE) 100 MG capsule, Take by mouth., Disp: , Rfl:  .  fluticasone (FLONASE) 50 MCG/ACT nasal spray, 1 spray each nostril 1-2 times daily, Disp: , Rfl:  .  potassium chloride (K-DUR) 10 MEQ tablet, Take by mouth., Disp: , Rfl:  .  traMADol (ULTRAM) 50 MG tablet, Take by mouth., Disp: , Rfl:  .  clidinium-chlordiazePOXIDE (LIBRAX) 5-2.5 MG per capsule, Take 1 capsule 1-2 times daily as needed., Disp: 60 capsule, Rfl: 0 .  furosemide (LASIX) 40 MG tablet, Take 1 tablet (40 mg total) by mouth 2 (two) times daily., Disp: 60 tablet, Rfl: 3 .  Hyoscyamine Sulfate SL (LEVSIN/SL) 0.125 MG SUBL, Dissolve 1 tablet on the tongue for stomach rumbling/diarrhea., Disp: 30 each, Rfl: 3 .  metoprolol succinate (TOPROL-XL) 200 MG 24 hr tablet, Take 1 tablet (200 mg total) by mouth daily. Take with or immediately following a meal., Disp: 60 tablet, Rfl: 3 .  potassium chloride (K-DUR,KLOR-CON) 10 MEQ tablet, Take 1 tablet (10 mEq total) by mouth 2 (two) times daily., Disp: 60 tablet, Rfl: 3 .  ranitidine (ZANTAC) 150 MG tablet, Take 1 tablet (150 mg total) by mouth every morning., Disp: 30 tablet, Rfl: 3 .  rivaroxaban (XARELTO) 20 MG TABS tablet, Take 20 mg by mouth daily with supper., Disp: , Rfl:  .  sacubitril-valsartan (ENTRESTO) 97-103 MG, Take 1 tablet by mouth 2 (two) times daily., Disp: , Rfl:  .  spironolactone (ALDACTONE) 50 MG tablet, Take 50 mg by mouth daily., Disp: , Rfl:   Current Facility-Administered Medications:  .  cyanocobalamin ((VITAMIN B-12)) injection 1,000 mcg, 1,000 mcg, Intramuscular, Q30 days, Patterson, David R, MD, 1,000 mcg at 12/04/12 0817    Allergies  Allergen Reactions  . Lactose Intolerance (Gi) Other (See Comments)    GI upset     OBJECTIVE General Patient is awake, alert, and oriented x 3 and in no acute distress.  Vascular Examination: DP and PT pedal pulses palpable bilaterally.   Temperature gradient within normal limits.   Dermatological Examination: Skin is dry and supple bilateral.   No open lesions or interdigital macerations.   Remaining integument unremarkable.   Nails are tender, long, thickened and dystrophic with subungual debris, consistent with onychomycosis, 1-5 bilateral. No signs of infection noted.  Hyperkeratotic lesions submet head 5 b/l, submet 5th met base b/l. No edema, no erythema, no drainage.  Neurological Examination: Epicritic and protective threshold sensation diminished bilaterally.   Musculoskeletal Exam  HAV with bunion b/l  Hammertoe 5th digits b/l  Muscular strength within normal limits.  Assessment: Mycotic nail infection with pain 1-5 b/l Calluses submet head 5 b/l, sub 5th met base b/l Pt on long term blood thinner  Plan: Debride painful toenails 1-5 b/l with no iatrogenic bleeding. Calluses pared without incident submet head 5 b/l, sub 5th met base b/l Recommended change in shoe gear to [employer] Walk Slip Ons with memory foam insoles Follow up 3 months 

## 2018-12-07 ENCOUNTER — Encounter (INDEPENDENT_AMBULATORY_CARE_PROVIDER_SITE_OTHER): Payer: Medicare HMO | Admitting: Ophthalmology

## 2018-12-07 DIAGNOSIS — H35033 Hypertensive retinopathy, bilateral: Secondary | ICD-10-CM | POA: Diagnosis not present

## 2018-12-07 DIAGNOSIS — H43813 Vitreous degeneration, bilateral: Secondary | ICD-10-CM | POA: Diagnosis not present

## 2018-12-07 DIAGNOSIS — I10 Essential (primary) hypertension: Secondary | ICD-10-CM

## 2018-12-07 DIAGNOSIS — H59031 Cystoid macular edema following cataract surgery, right eye: Secondary | ICD-10-CM | POA: Diagnosis not present

## 2018-12-07 DIAGNOSIS — D3131 Benign neoplasm of right choroid: Secondary | ICD-10-CM

## 2018-12-19 ENCOUNTER — Encounter (INDEPENDENT_AMBULATORY_CARE_PROVIDER_SITE_OTHER): Payer: Medicare HMO | Admitting: Ophthalmology

## 2018-12-19 DIAGNOSIS — H35033 Hypertensive retinopathy, bilateral: Secondary | ICD-10-CM | POA: Diagnosis not present

## 2018-12-19 DIAGNOSIS — H59031 Cystoid macular edema following cataract surgery, right eye: Secondary | ICD-10-CM

## 2018-12-19 DIAGNOSIS — I10 Essential (primary) hypertension: Secondary | ICD-10-CM | POA: Diagnosis not present

## 2018-12-19 DIAGNOSIS — D3131 Benign neoplasm of right choroid: Secondary | ICD-10-CM | POA: Diagnosis not present

## 2018-12-19 DIAGNOSIS — H43813 Vitreous degeneration, bilateral: Secondary | ICD-10-CM

## 2019-01-22 ENCOUNTER — Other Ambulatory Visit: Payer: Self-pay

## 2019-01-22 ENCOUNTER — Encounter (INDEPENDENT_AMBULATORY_CARE_PROVIDER_SITE_OTHER): Payer: Medicare HMO | Admitting: Ophthalmology

## 2019-01-22 DIAGNOSIS — I10 Essential (primary) hypertension: Secondary | ICD-10-CM

## 2019-01-22 DIAGNOSIS — H43813 Vitreous degeneration, bilateral: Secondary | ICD-10-CM

## 2019-01-22 DIAGNOSIS — H35033 Hypertensive retinopathy, bilateral: Secondary | ICD-10-CM

## 2019-01-22 DIAGNOSIS — H59031 Cystoid macular edema following cataract surgery, right eye: Secondary | ICD-10-CM | POA: Diagnosis not present

## 2019-01-22 DIAGNOSIS — D3131 Benign neoplasm of right choroid: Secondary | ICD-10-CM | POA: Diagnosis not present

## 2019-01-25 ENCOUNTER — Ambulatory Visit: Payer: Medicare HMO | Admitting: Podiatry

## 2019-01-29 ENCOUNTER — Ambulatory Visit: Payer: Self-pay | Admitting: Cardiology

## 2019-01-30 ENCOUNTER — Ambulatory Visit: Payer: Medicare HMO | Admitting: Podiatry

## 2019-03-02 NOTE — Progress Notes (Deleted)
Subjective:  Primary Physician:  Aletha Halim., PA-C  Patient ID: Ronald [name], male    DOB: 1934-10-28, 85 y.o.   MRN: [id number]  No chief complaint on file.   HPI: Ronald [name]  is a 85 y.o. male . ry artery disease- 40% stenosis mid LAD, 60% second diagonal, 50% stenosis of first obtuse marginal. Nonischemic cardiomyopathy. Patient was also seen by EP specialist Dr. Jolyn Nap in Dec. 2018 for possible ICD implant. Dr. Caryl Comes felt that in view of patient's age and atrial fibrillation, benefits of ICD may not outweigh the risks and he did not recommend ICD implant.  His EF by echocardiogram in December 2018 was 15-20 percent.  Patient is feeling fair. He is able to walk more than 200 yards. 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 He has been able to go up one flight of stairs but feels tired on doing that. No complaints of orthopnea or PND. Has occasional minimal leg swelling in the afternoon and evening. No chest pain, tightness or pressure. He is complaining of feeling tired and low energy.  No complaints of feeling palpitation or rapid heartbeat. He has occasional dizziness, denies feeling any spells of rapid heartbeat. No history of near-syncope or syncope.  Patient has hypertension, no history of diabetes or hypercholesterolemia. He does not smoke.  He also has h/o GERD and hiatal hernia. No history of thyroid problems. No history of TIA or CVA.  No history of hematuria, GI bleed or bleeding from any site. No history of excessive bruising. Patient has history of chronic mild leukopenia and thrombocytopenia.  Past Medical History:  Diagnosis Date  . Allergic rhinitis, cause unspecified   . BPH (benign prostatic hypertrophy)   . Essential hypertension, benign   . GERD (gastroesophageal reflux disease)   . Glaucoma   . Hepatic cyst   . Hiatal hernia 2014  . History of gout   . Hyperlipidemia   .  Irritable bowel syndrome   . Lactose intolerance   . Personal history of colonic polyps 03/04/1992   adenomatous polyp  . Pneumonia    "~ 5 times in my lifetime; last time was 03/2014" (10/14/2017)  . Reflux esophagitis   . Schatzki's ring 2014  . Sleep apnea   . Vitamin B12 deficiency     Past Surgical History:  Procedure Laterality Date  . CATARACT EXTRACTION W/ INTRAOCULAR LENS IMPLANT Left   . HYDROCELE EXCISION Bilateral 10/2016   Archie Endo 10/11/2016  . INTRAVASCULAR PRESSURE WIRE/FFR STUDY N/A 10/17/2017   Procedure: INTRAVASCULAR PRESSURE WIRE/FFR STUDY;  Surgeon: Nigel Mormon, MD;  Location: [facility];  Service: Cardiovascular;  Laterality: N/A;  . NASAL RECONSTRUCTION     "tried to fix my nose so I can smell; still can't smell" (10/14/2017)  . RIGHT/LEFT HEART CATH AND CORONARY ANGIOGRAPHY N/A 10/17/2017   Procedure: RIGHT/LEFT HEART CATH AND CORONARY ANGIOGRAPHY;  Surgeon: Nigel Mormon, MD;  Location: [facility];  Service: Cardiovascular;  Laterality: N/A;  . TRANSURETHRAL RESECTION OF PROSTATE  10/2007   Archie Endo 08/22/2007  . ULTRASOUND GUIDANCE FOR VASCULAR ACCESS  10/17/2017   Procedure: Ultrasound Guidance For Vascular Access;  Surgeon: Nigel Mormon, MD;  Location: [facility];  Service: Cardiovascular;;    Social History   Socioeconomic History  . Marital status: Married    Spouse name: Not on file  . Number of children: Not on  file  . Years of education: Not on file  . Highest education level: Not on file  Occupational History  . Not on file  Social Needs  . Financial resource strain: Not on file  . Food insecurity:    Worry: Not on file    Inability: Not on file  . Transportation needs:    Medical: Not on file    Non-medical: Not on file  Tobacco Use  . Smoking status: Former Smoker    Packs/day: 1.00    Years: 30.00    Pack years: 30.00    Types: Cigarettes    Last attempt to quit: 11/02/1983    Years  since quitting: 35.3  . Smokeless tobacco: Never Used  Substance and Sexual Activity  . Alcohol use: No    Frequency: Never  . Drug use: No  . Sexual activity: Not on file  Lifestyle  . Physical activity:    Days per week: Not on file    Minutes per session: Not on file  . Stress: Not on file  Relationships  . Social connections:    Talks on phone: Not on file    Gets together: Not on file    Attends religious service: Not on file    Active member of club or organization: Not on file    Attends meetings of clubs or organizations: Not on file    Relationship status: Not on file  . Intimate partner violence:    Fear of current or ex partner: Not on file    Emotionally abused: Not on file    Physically abused: Not on file    Forced sexual activity: Not on file  Other Topics Concern  . Not on file  Social History Narrative  . Not on file    Current Outpatient Medications on File Prior to Visit  Medication Sig Dispense Refill  . cetirizine (ZYRTEC) 10 MG tablet TAKE 1 TABLET BY MOUTH EVERY DAY    . clidinium-chlordiazePOXIDE (LIBRAX) 5-2.5 MG per capsule Take 1 capsule 1-2 times daily as needed. 60 capsule 0  . docusate sodium (COLACE) 100 MG capsule Take by mouth.    . fluticasone (FLONASE) 50 MCG/ACT nasal spray 1 spray each nostril 1-2 times daily    . furosemide (LASIX) 40 MG tablet Take 1 tablet (40 mg total) by mouth 2 (two) times daily. 60 tablet 3  . Hyoscyamine Sulfate SL (LEVSIN/SL) 0.125 MG SUBL Dissolve 1 tablet on the tongue for stomach rumbling/diarrhea. 30 each 3  . metoprolol succinate (TOPROL-XL) 200 MG 24 hr tablet Take 1 tablet (200 mg total) by mouth daily. Take with or immediately following a meal. 60 tablet 3  . potassium chloride (K-DUR) 10 MEQ tablet Take by mouth.    . potassium chloride (K-DUR,KLOR-CON) 10 MEQ tablet Take 1 tablet (10 mEq total) by mouth 2 (two) times daily. 60 tablet 3  . ranitidine (ZANTAC) 150 MG tablet Take 1 tablet (150 mg total) by  mouth every morning. 30 tablet 3  . rivaroxaban (XARELTO) 20 MG TABS tablet Take 20 mg by mouth daily with supper.    . sacubitril-valsartan (ENTRESTO) 97-103 MG Take 1 tablet by mouth 2 (two) times daily.    [name] spironolactone (ALDACTONE) 50 MG tablet Take 50 mg by mouth daily.    . traMADol (ULTRAM) 50 MG tablet Take by mouth.     Current Facility-Administered Medications on File Prior to Visit  Medication Dose Route Frequency Provider Last Rate Last Dose  . cyanocobalamin ((  VITAMIN B-12)) injection 1,000 mcg  1,000 mcg Intramuscular Q30 days Sable Feil, MD   1,000 mcg at 12/04/12 1287    Review of Systems  Constitutional: Negative for fever.  HENT: Negative for nosebleeds.   Eyes: Negative for blurred vision.  Respiratory: Positive for shortness of breath. Negative for cough.   Cardiovascular: Positive for leg swelling (occasional, minimal edema).  Gastrointestinal: Negative for abdominal pain, nausea and vomiting.  Genitourinary: Negative for dysuria.  Musculoskeletal: Negative for myalgias.  Skin: Negative for itching and rash.  Neurological: Negative for dizziness, seizures and loss of consciousness.  Psychiatric/Behavioral: The patient is not nervous/anxious.        Objective:  There were no vitals taken for this visit. There is no height or weight on file to calculate BMI.  Physical Exam  CARDIAC STUDIES:   RHC/LHC 10/17/2017: Nonobstructive coronary artery disease- 40% stenosis mid LAD, 60% second diagonal, 50% stenosis of first obtuse marginal. Nonischemic cardiomyopathy WHO Grp II pulmonary hypertension mPAP 30 mmhg Echocardiogram 11/07/2018: Left ventricle cavity is normal in size. Mild concentric hypertrophy of the left ventricle. Visual EF is 35-40%. Systolic septal flattening due to right ventricular pressure overload. Calculated EF 41%. Unable to evaluate diastolic function due to A. Fibrillation. Left atrial cavity is moderately dilated. Right atrial cavity  is severely dilated. Right ventricle cavity is mildly dilated. Low normal right ventricular function. Mild (Grade I) mitral regurgitation. Moderate to severe tricuspid regurgitation. Estimated pulmonary artery systolic pressure 31 mmHg. Compared to previous study on 10/04/2017, LVEF is significantly improved. Mitral regurgitation, pulmonary hypertension, and RV dilatation severity decreased. Lexiscan myoview stress test 08/08/2017: 1. Low risk of hemodynamically significant coronary artery disease. Prognostically, this is a high risk study. The LV is dilated both in rest and stress images with severe global hypokinesis. 2. Rest and stress EKG show A. FIb and occasional PVC. Non diagnostic due to pharmacologic stress. 3. Fixed perfusion defect due to diaphragmatic attenuation. 4. The gated study showed the ejection fraction was <10%.   Assessment & Recommendations:   1. Chronic systolic heart failure ([place])  2. Permanent atrial fibrillation  3. Nonobstructive atherosclerosis of coronary artery  4. Hypertension with heart disease   Laboratory Exam:  CBC Latest Ref Rng & Units 01/17/2018 10/18/2017 10/17/2017  WBC 4.0 - 10.5 K/uL 2.8(L) 3.3(L) 3.3(L)  Hemoglobin 13.0 - 17.0 g/dL 13.7 14.5 14.1  Hematocrit 39.0 - 52.0 % 41.4 44.4 44.2  Platelets 150.0 - 400.0 K/uL 119.0(L) 105(L) 108(L)   CMP Latest Ref Rng & Units 10/18/2017 10/17/2017 10/16/2017  Glucose 65 - 99 mg/dL 89 86 85  BUN 6 - 20 mg/dL 17 15 13   Creatinine 0.61 - 1.24 mg/dL 1.26(H) 1.31(H) 1.25(H)  Sodium 135 - 145 mmol/L 138 141 141  Potassium 3.5 - 5.1 mmol/L 3.9 3.8 3.4(L)  Chloride 101 - 111 mmol/L 103 99(L) 102  CO2 22 - 32 mmol/L 28 34(H) 31  Calcium 8.9 - 10.3 mg/dL 8.8(L) 8.8(L) 8.7(L)  Total Protein 6.5 - 8.1 g/dL - - -  Total Bilirubin 0.3 - 1.2 mg/dL - - -  Alkaline Phos 38 - 126 U/L - - -  AST 15 - 41 U/L - - -  ALT 17 - 63 U/L - - -   Lipid Panel     Component Value Date/Time   CHOL 86 03/25/2014  0235   TRIG 60 03/25/2014 0235   HDL 35 (L) 03/25/2014 0235   CHOLHDL 2.5 03/25/2014 0235   VLDL 12 03/25/2014 0235  LDLCALC 39 03/25/2014 0235     Recommendation:  Patient remains stable symptomatically. CHF is fairly compensated clinically. He was advised to continue present medications. His systolic blood pressure is elevated today, I have reviewed his medicines personally and it turned out that he has stopped Entresto one month ago. Patient said that it was expensive and he was not sure if he needs to continue and wanted to talk to me. I have advised him to restart Entresto and the beneficial effects of Entresto were explained to him at length.  Ventricular response with atrial fibrillation is controlled. Continue Xarelto. Bleeding complications of Xarelto therapy were again explained. He was advised to observe and if there is any abnormal or excessive bleeding, he should stop Xarelto and call us. He was also advised to avoid aspirin, ibuprofen, Naprosyn or other NSAIDs. Patient verbalized understanding.  Patient was advised to follow low-salt diet and continue fluid restriction to 1.5 L daily.  Return for cardiac follow-up after 3 months but call us earlier if there is any worsening of dyspnea, edema or weight gain of 3 pounds or more in 24 hours period.   Despina Hick, MD, [facility] 03/02/2019, 11:58 AM [facility] [place] Pager: [phone] Office: [phone] If no answer Cell [phone]

## 2019-03-05 ENCOUNTER — Encounter: Payer: Self-pay | Admitting: Cardiology

## 2019-03-05 ENCOUNTER — Ambulatory Visit: Payer: Self-pay | Admitting: Cardiology

## 2019-03-06 ENCOUNTER — Ambulatory Visit (INDEPENDENT_AMBULATORY_CARE_PROVIDER_SITE_OTHER): Payer: Medicare HMO | Admitting: Cardiology

## 2019-03-06 ENCOUNTER — Encounter: Payer: Self-pay | Admitting: Cardiology

## 2019-03-06 ENCOUNTER — Other Ambulatory Visit: Payer: Self-pay

## 2019-03-06 DIAGNOSIS — I428 Other cardiomyopathies: Secondary | ICD-10-CM

## 2019-03-06 NOTE — Progress Notes (Signed)
Subjective:  Primary Physician:  Aletha Halim., PA-C  Patient ID: Ronald [name], male    DOB: 14-Apr-1934, 85 y.o.   MRN: [id number]  This visit type was conducted due to national recommendations for restrictions regarding the COVID-19 Pandemic (e.g. social distancing).  This format is felt to be most appropriate for this patient at this time.  All issues noted in this document were discussed and addressed.  No physical exam was performed (except for noted visual exam findings with Telehealth visits - very limited).  The patient has consented to conduct a Telehealth visit and understands insurance will be billed.   I connected with patient, on 03/06/19  by a telemedicine application and verified that I am speaking with the correct person using two identifiers.     I discussed the limitations of evaluation and management by telemedicine and the availability of in person appointments. The patient expressed understanding and agreed to proceed.   I have discussed with patient regarding the safety during COVID Pandemic and steps and precautions including social distancing with the patient.    Chief Complaint  Patient presents with  . Congestive Heart Failure  . Follow-up    HPI: Ronald [name]  is a 85 y.o. male.  He has dilated, nonischemic cardiomyopathy, refractory CHF, atrial fibrillation. RHC/LHC 10/17/2017: Nonobstructive coronary arry artery disease- 40% stenosis mid LAD, 60% second diagonal, 50% stenosis of first obtuse marginal.  Patient was also seen by EP specialist Dr. Jolyn Nap in Dec. 2018 for possible ICD implant. Dr. Caryl Comes felt that in view of patient's age and atrial fibrillation, benefits of ICD may not outweigh the risks and he did not recommend ICD implant.  His EF by echocardiogram on 11/07/2018 was 35-40 percent.  Patient is feeling fair. He is able to walk more than 200 yards. He has been Psychiatrist work. He has been able to go up one flight of  stairs but feels tired on doing that. No complaints of orthopnea or PND. No c/o leg swelling presently.No chest pain, tightness or pressure. He is complaining of feeling tired and low energy, unchanged from before.  No complaints of feeling palpitation or rapid heartbeat. He has occasional dizziness, denies feeling any spells of rapid heartbeat. No history of near-syncope or syncope.  Patient has hypertension, no history of diabetes or hypercholesterolemia. He does not smoke.  He also has h/o GERD and hiatal hernia. No history of thyroid problems. No history of TIA or CVA.  No history of hematuria, GI bleed or bleeding from any site. No history of excessive bruising. Patient has history of chronic mild leukopenia and thrombocytopenia, has appointment to see hematologist later this month.  Past Medical History:  Diagnosis Date  . Allergic rhinitis, cause unspecified   . BPH (benign prostatic hypertrophy)   . Essential hypertension, benign   . GERD (gastroesophageal reflux disease)   . Glaucoma   . Hepatic cyst   . Hiatal hernia 2014  . History of gout   . Hyperlipidemia   . Irritable bowel syndrome   . Lactose intolerance   . Personal history of colonic polyps 03/04/1992   adenomatous polyp  . Pneumonia    "~ 5 times in my lifetime; last time was 03/2014" (10/14/2017)  . Reflux esophagitis   . Schatzki's ring 2014  . Sleep apnea   . Vitamin B12 deficiency     Past Surgical History:  Procedure Laterality Date  . CATARACT EXTRACTION W/ INTRAOCULAR LENS IMPLANT Left   .  HYDROCELE EXCISION Bilateral 10/2016   Archie Endo 10/11/2016  . INTRAVASCULAR PRESSURE WIRE/FFR STUDY N/A 10/17/2017   Procedure: INTRAVASCULAR PRESSURE WIRE/FFR STUDY;  Surgeon: Nigel Mormon, MD;  Location: [facility];  Service: Cardiovascular;  Laterality: N/A;  . NASAL RECONSTRUCTION     "tried to fix my nose so I can smell; still can't smell" (10/14/2017)  . RIGHT/LEFT HEART CATH AND CORONARY  ANGIOGRAPHY N/A 10/17/2017   Procedure: RIGHT/LEFT HEART CATH AND CORONARY ANGIOGRAPHY;  Surgeon: Nigel Mormon, MD;  Location: [facility];  Service: Cardiovascular;  Laterality: N/A;  . TRANSURETHRAL RESECTION OF PROSTATE  10/2007   Archie Endo 08/22/2007  . ULTRASOUND GUIDANCE FOR VASCULAR ACCESS  10/17/2017   Procedure: Ultrasound Guidance For Vascular Access;  Surgeon: Nigel Mormon, MD;  Location: [facility];  Service: Cardiovascular;;    Social History   Socioeconomic History  . Marital status: Married    Spouse name: Not on file  . Number of children: 2  . Years of education: Not on file  . Highest education level: Not on file  Occupational History  . Not on file  Social Needs  . Financial resource strain: Not on file  . Food insecurity:    Worry: Not on file    Inability: Not on file  . Transportation needs:    Medical: Not on file    Non-medical: Not on file  Tobacco Use  . Smoking status: Former Smoker    Packs/day: 1.00    Years: 30.00    Pack years: 30.00    Types: Cigarettes    Last attempt to quit: 11/02/1983    Years since quitting: 35.3  . Smokeless tobacco: Never Used  Substance and Sexual Activity  . Alcohol use: No    Frequency: Never  . Drug use: No  . Sexual activity: Not on file  Lifestyle  . Physical activity:    Days per week: Not on file    Minutes per session: Not on file  . Stress: Not on file  Relationships  . Social connections:    Talks on phone: Not on file    Gets together: Not on file    Attends religious service: Not on file    Active member of club or organization: Not on file    Attends meetings of clubs or organizations: Not on file    Relationship status: Not on file  . Intimate partner violence:    Fear of current or ex partner: Not on file    Emotionally abused: Not on file    Physically abused: Not on file    Forced sexual activity: Not on file  Other Topics Concern  . Not on file  Social  History Narrative  . Not on file    Current Outpatient Medications on File Prior to Visit  Medication Sig Dispense Refill  . azelastine (ASTELIN) 0.1 % nasal spray Place into the nose as needed.    . cetirizine (ZYRTEC) 10 MG tablet TAKE 1 TABLET BY MOUTH EVERY DAY    . fluticasone (FLONASE) 50 MCG/ACT nasal spray 1 spray each nostril 1-2 times daily    . furosemide (LASIX) 40 MG tablet Take 1 tablet (40 mg total) by mouth 2 (two) times daily. 60 tablet 3  . Hyoscyamine Sulfate SL (LEVSIN/SL) 0.125 MG SUBL Dissolve 1 tablet on the tongue for stomach rumbling/diarrhea. 30 each 3  . metoprolol succinate (TOPROL-XL) 200 MG 24 hr tablet Take 1 tablet (200 mg total)  by mouth daily. Take with or immediately following a meal. 60 tablet 3  . montelukast (SINGULAIR) 10 MG tablet Take by mouth daily.    . potassium chloride (K-DUR) 10 MEQ tablet Take by mouth.    . prednisoLONE acetate (PRED FORTE) 1 % ophthalmic suspension INSTILL ONE DROP INTO THE RIGHT EYE 4 TIMES A DAY    . PROLENSA 0.07 % SOLN INSTILL 1 DROP IN THE RIGHT EYE AT BEDTIME    . rivaroxaban (XARELTO) 20 MG TABS tablet Take 20 mg by mouth daily with supper.    . sacubitril-valsartan (ENTRESTO) 97-103 MG Take 1 tablet by mouth 2 (two) times daily.    [name] spironolactone (ALDACTONE) 50 MG tablet Take 50 mg by mouth daily.    . traMADol (ULTRAM) 50 MG tablet Take by mouth.     Current Facility-Administered Medications on File Prior to Visit  Medication Dose Route Frequency Provider Last Rate Last Dose  . cyanocobalamin ((VITAMIN B-12)) injection 1,000 mcg  1,000 mcg Intramuscular Q30 days Sable Feil, MD   1,000 mcg at 12/04/12 4166    Review of Systems  Constitutional: Negative for fever.  HENT: Negative for nosebleeds.   Eyes: Negative for blurred vision.  Respiratory: Positive for shortness of breath (on exertion). Negative for cough.   Cardiovascular: Negative for leg swelling.  Gastrointestinal: Negative for abdominal  pain, nausea and vomiting.  Genitourinary: Negative for dysuria.  Musculoskeletal: Negative for myalgias.  Skin: Negative for itching and rash.  Neurological: Negative for dizziness, seizures and loss of consciousness.  Psychiatric/Behavioral: The patient is not nervous/anxious.        Objective:  Height 5\' 11"  (1.803 m), weight 180 lb (81.6 kg). Body mass index is 25.1 kg/m.  Physical Exam  Patient is alert and oriented.  He appeared very comfortable talking to me during the visit.  No further physical examination was possible as it was a telemedicine visit.  CARDIAC STUDIES:   RHC/LHC 10/17/2017: Nonobstructive coronary artery disease- 40% stenosis mid LAD, 60% second diagonal, 50% stenosis of first obtuse marginal. Nonischemic cardiomyopathy WHO Grp II pulmonary hypertension mPAP 30 mmhg Echocardiogram 11/07/2018: Left ventricle cavity is normal in size. Mild concentric hypertrophy of the left ventricle. Visual EF is 35-40%. Systolic septal flattening due to right ventricular pressure overload. Calculated EF 41%. Unable to evaluate diastolic function due to A. Fibrillation. Left atrial cavity is moderately dilated. Right atrial cavity is severely dilated. Right ventricle cavity is mildly dilated. Low normal right ventricular function. Mild (Grade I) mitral regurgitation. Moderate to severe tricuspid regurgitation. Estimated pulmonary artery systolic pressure 31 mmHg. Compared to previous study on 10/04/2017, LVEF is significantly improved. Mitral regurgitation, pulmonary hypertension, and RV dilatation severity decreased. Lexiscan myoview stress test 08/08/2017: 1. Low risk of hemodynamically significant coronary artery disease. Prognostically, this is a high risk study. The LV is dilated both in rest and stress images with severe global hypokinesis. 2. Rest and stress EKG show A. FIb and occasional PVC. Non diagnostic due to pharmacologic stress. 3. Fixed perfusion defect due to  diaphragmatic attenuation. 4. The gated study showed the ejection fraction was <10%.   Assessment & Recommendations:   1. Chronic systolic heart failure ([place])  2. Permanent atrial fibrillation  3. Nonobstructive atherosclerosis of coronary artery  4. Hypertension with heart disease  5. Nonischemic cardiomyopathy   Laboratory Exam:  10/23/2018-WBC-2.3, hemoglobin-14.6, hematocrit-43.7, platelets-144  10/23/2018-Glucose-94, BUN-12, creatinine-1.23, AYT-01.  Sodium-145, potassium-4.0  CBC Latest Ref Rng & Units 01/17/2018 10/18/2017 10/17/2017  WBC 4.0 -  10.5 K/uL 2.8(L) 3.3(L) 3.3(L)  Hemoglobin 13.0 - 17.0 g/dL 13.7 14.5 14.1  Hematocrit 39.0 - 52.0 % 41.4 44.4 44.2  Platelets 150.0 - 400.0 K/uL 119.0(L) 105(L) 108(L)   CMP Latest Ref Rng & Units 10/18/2017 10/17/2017 10/16/2017  Glucose 65 - 99 mg/dL 89 86 85  BUN 6 - 20 mg/dL 17 15 13   Creatinine 0.61 - 1.24 mg/dL 1.26(H) 1.31(H) 1.25(H)  Sodium 135 - 145 mmol/L 138 141 141  Potassium 3.5 - 5.1 mmol/L 3.9 3.8 3.4(L)  Chloride 101 - 111 mmol/L 103 99(L) 102  CO2 22 - 32 mmol/L 28 34(H) 31  Calcium 8.9 - 10.3 mg/dL 8.8(L) 8.8(L) 8.7(L)  Total Protein 6.5 - 8.1 g/dL - - -  Total Bilirubin 0.3 - 1.2 mg/dL - - -  Alkaline Phos 38 - 126 U/L - - -  AST 15 - 41 U/L - - -  ALT 17 - 63 U/L - - -   Lipid Panel     Component Value Date/Time   CHOL 86 03/25/2014 0235   TRIG 60 03/25/2014 0235   HDL 35 (L) 03/25/2014 0235   CHOLHDL 2.5 03/25/2014 0235   VLDL 12 03/25/2014 0235   LDLCALC 39 03/25/2014 0235     Recommendation:  Patient remains stable symptomatically. CHF appears compensated clinically. He was advised to continue present medications. Patient has not been checking blood pressure at home, I have advised him to start doing that. Ventricular response with atrial fibrillation seems controlled. Continue Xarelto. Bleeding complications of Xarelto therapy were again explained. He was advised to observe and if there  is any abnormal or excessive bleeding, he should stop Xarelto and call us. He was also advised to avoid aspirin, ibuprofen, Naprosyn or other NSAIDs. Patient verbalized understanding.  Patient was advised to follow low-salt diet and continue fluid restriction to 1.5 L daily.  Return for cardiac follow-up after 3 months but call us earlier if there is any worsening of dyspnea, edema or weight gain of 3 pounds or more in 24 hours period.   Despina Hick, MD, [facility] 03/06/2019, 9:41 AM [facility]. [place] Pager: [phone] Office: [phone] If no answer Cell [phone]

## 2019-03-27 ENCOUNTER — Other Ambulatory Visit: Payer: Self-pay

## 2019-03-27 ENCOUNTER — Encounter (INDEPENDENT_AMBULATORY_CARE_PROVIDER_SITE_OTHER): Payer: Medicare HMO | Admitting: Ophthalmology

## 2019-03-27 DIAGNOSIS — H35033 Hypertensive retinopathy, bilateral: Secondary | ICD-10-CM | POA: Diagnosis not present

## 2019-03-27 DIAGNOSIS — I10 Essential (primary) hypertension: Secondary | ICD-10-CM | POA: Diagnosis not present

## 2019-03-27 DIAGNOSIS — H43813 Vitreous degeneration, bilateral: Secondary | ICD-10-CM

## 2019-03-27 DIAGNOSIS — H353122 Nonexudative age-related macular degeneration, left eye, intermediate dry stage: Secondary | ICD-10-CM

## 2019-03-27 DIAGNOSIS — D3131 Benign neoplasm of right choroid: Secondary | ICD-10-CM

## 2019-03-27 DIAGNOSIS — H59031 Cystoid macular edema following cataract surgery, right eye: Secondary | ICD-10-CM | POA: Diagnosis not present

## 2019-04-20 ENCOUNTER — Telehealth: Payer: Self-pay

## 2019-04-20 NOTE — Telephone Encounter (Signed)
PA from [place] called wanted to make you aware of office vist today with pt. Ronald [name] she will fax ov over to [place] but per [place], records are in care everywhere. Please review and/or contact B. Jimmye Norman PA to discuss if questions @ [phone]

## 2019-06-08 DIAGNOSIS — I428 Other cardiomyopathies: Secondary | ICD-10-CM | POA: Insufficient documentation

## 2019-06-08 DIAGNOSIS — I2510 Atherosclerotic heart disease of native coronary artery without angina pectoris: Secondary | ICD-10-CM | POA: Insufficient documentation

## 2019-06-12 ENCOUNTER — Encounter: Payer: Self-pay | Admitting: Cardiology

## 2019-06-12 ENCOUNTER — Ambulatory Visit: Payer: Medicare HMO | Admitting: Cardiology

## 2019-06-12 ENCOUNTER — Other Ambulatory Visit: Payer: Self-pay

## 2019-06-12 VITALS — BP 133/72 | HR 76 | Ht 71.0 in | Wt 180.7 lb

## 2019-06-12 DIAGNOSIS — I2510 Atherosclerotic heart disease of native coronary artery without angina pectoris: Secondary | ICD-10-CM

## 2019-06-12 DIAGNOSIS — I428 Other cardiomyopathies: Secondary | ICD-10-CM

## 2019-06-12 DIAGNOSIS — I119 Hypertensive heart disease without heart failure: Secondary | ICD-10-CM

## 2019-06-12 DIAGNOSIS — I4821 Permanent atrial fibrillation: Secondary | ICD-10-CM

## 2019-06-12 DIAGNOSIS — I5022 Chronic systolic (congestive) heart failure: Secondary | ICD-10-CM

## 2019-06-12 MED ORDER — FUROSEMIDE 20 MG PO TABS: 20.0000 mg | ORAL_TABLET | Freq: Every day | ORAL | 3 refills | Status: DC

## 2019-06-12 NOTE — Progress Notes (Signed)
Subjective:  Primary Physician:  Aletha Halim., PA-C  Patient ID: Ronald [name], male    DOB: 07/05/1934, 85 y.o.   MRN: [id number]   Chief Complaint  Patient presents with  . Congestive Heart Failure  . Atrial Fibrillation  . Follow-up   HPI: Ronald [name]  is a 85 y.o. male.  He has dilated, nonischemic cardiomyopathy, refractory CHF, atrial fibrillation. RHC/LHC 10/17/2017: Nonobstructive coronary arry artery disease- 40% stenosis mid LAD, 60% second diagonal, 50% stenosis of first obtuse marginal.  Patient was also seen by EP specialist Dr. Jolyn Nap in Dec. 2018 for possible ICD implant. Dr. Caryl Comes felt that in view of patient's age and atrial fibrillation, benefits of ICD may not outweigh the risks and he did not recommend ICD implant.  His EF by echocardiogram on 11/07/2018 was 35-40 percent.  Patient is feeling fair. He is able to walk more than 200 yards. He has been Psychiatrist work. He has been able to go up one flight of stairs but feels tired on doing that. No complaints of orthopnea or PND. No c/o leg swelling presently.No chest pain, tightness or pressure. He is complaining of feeling tired and low energy, unchanged from before.  No complaints of feeling palpitation or rapid heartbeat. He has occasional dizziness, denies feeling any spells of rapid heartbeat. No history of near-syncope or syncope.  Patient has hypertension, no history of diabetes or hypercholesterolemia. He does not smoke.  He also has h/o GERD and hiatal hernia. No history of thyroid problems. No history of TIA or CVA.  No history of hematuria, GI bleed or bleeding from any site. No history of excessive bruising. Patient has history of chronic mild leukopenia and thrombocytopenia, he said he will make appointment to see hematologist.  Past Medical History:  Diagnosis Date  . Allergic rhinitis, cause unspecified   . BPH (benign prostatic hypertrophy)   . Essential  hypertension, benign   . GERD (gastroesophageal reflux disease)   . Glaucoma   . Hepatic cyst   . Hiatal hernia 2014  . History of gout   . Hyperlipidemia   . Irritable bowel syndrome   . Lactose intolerance   . Personal history of colonic polyps 03/04/1992   adenomatous polyp  . Pneumonia    "~ 5 times in my lifetime; last time was 03/2014" (10/14/2017)  . Reflux esophagitis   . Schatzki's ring 2014  . Sleep apnea   . Vitamin B12 deficiency     Past Surgical History:  Procedure Laterality Date  . CATARACT EXTRACTION W/ INTRAOCULAR LENS IMPLANT Left   . HYDROCELE EXCISION Bilateral 10/2016   Archie Endo 10/11/2016  . INTRAVASCULAR PRESSURE WIRE/FFR STUDY N/A 10/17/2017   Procedure: INTRAVASCULAR PRESSURE WIRE/FFR STUDY;  Surgeon: Nigel Mormon, MD;  Location: [facility];  Service: Cardiovascular;  Laterality: N/A;  . NASAL RECONSTRUCTION     "tried to fix my nose so I can smell; still can't smell" (10/14/2017)  . RIGHT/LEFT HEART CATH AND CORONARY ANGIOGRAPHY N/A 10/17/2017   Procedure: RIGHT/LEFT HEART CATH AND CORONARY ANGIOGRAPHY;  Surgeon: Nigel Mormon, MD;  Location: [facility];  Service: Cardiovascular;  Laterality: N/A;  . TRANSURETHRAL RESECTION OF PROSTATE  10/2007   Archie Endo 08/22/2007  . ULTRASOUND GUIDANCE FOR VASCULAR ACCESS  10/17/2017   Procedure: Ultrasound Guidance For Vascular Access;  Surgeon: Nigel Mormon, MD;  Location: [facility];  Service: Cardiovascular;;    Social History   Socioeconomic History  .  Marital status: Married    Spouse name: Not on file  . Number of children: 2  . Years of education: Not on file  . Highest education level: Not on file  Occupational History  . Not on file  Social Needs  . Financial resource strain: Not on file  . Food insecurity    Worry: Not on file    Inability: Not on file  . Transportation needs    Medical: Not on file    Non-medical: Not on file  Tobacco Use  .  Smoking status: Former Smoker    Packs/day: 1.00    Years: 30.00    Pack years: 30.00    Types: Cigarettes    Quit date: 11/02/1983    Years since quitting: 35.6  . Smokeless tobacco: Never Used  Substance and Sexual Activity  . Alcohol use: No    Frequency: Never  . Drug use: No  . Sexual activity: Not on file  Lifestyle  . Physical activity    Days per week: Not on file    Minutes per session: Not on file  . Stress: Not on file  Relationships  . Social Herbalist on phone: Not on file    Gets together: Not on file    Attends religious service: Not on file    Active member of club or organization: Not on file    Attends meetings of clubs or organizations: Not on file    Relationship status: Not on file  . Intimate partner violence    Fear of current or ex partner: Not on file    Emotionally abused: Not on file    Physically abused: Not on file    Forced sexual activity: Not on file  Other Topics Concern  . Not on file  Social History Narrative  . Not on file    Current Outpatient Medications on File Prior to Visit  Medication Sig Dispense Refill  . azelastine (ASTELIN) 0.1 % nasal spray Place into the nose as needed.    . cetirizine (ZYRTEC) 10 MG tablet TAKE 1 TABLET BY MOUTH EVERY DAY    . fluticasone (FLONASE) 50 MCG/ACT nasal spray 1 spray each nostril 1-2 times daily    . metoprolol succinate (TOPROL-XL) 200 MG 24 hr tablet Take 1 tablet (200 mg total) by mouth daily. Take with or immediately following a meal. (Patient taking differently: Take 100 mg by mouth 2 (two) times daily. ) 60 tablet 3  . montelukast (SINGULAIR) 10 MG tablet Take by mouth daily.    . prednisoLONE acetate (PRED FORTE) 1 % ophthalmic suspension INSTILL ONE DROP INTO THE RIGHT EYE 4 TIMES A DAY    . PROLENSA 0.07 % SOLN INSTILL 1 DROP IN THE RIGHT EYE AT BEDTIME    . rivaroxaban (XARELTO) 20 MG TABS tablet Take 20 mg by mouth daily with supper.    . sacubitril-valsartan (ENTRESTO)  97-103 MG Take 1 tablet by mouth 2 (two) times daily.    [name] spironolactone (ALDACTONE) 50 MG tablet Take 50 mg by mouth daily.     Current Facility-Administered Medications on File Prior to Visit        Last Dose         1,000 mcg at 12/04/12 3419   Review of Systems  Constitutional: Negative for fever.  HENT: Negative for nosebleeds.   Eyes: Negative for blurred vision.  Respiratory: Positive for shortness of breath (on exertion). Negative for cough.   Cardiovascular: Negative  for leg swelling.  Gastrointestinal: Negative for abdominal pain, nausea and vomiting.  Genitourinary: Negative for dysuria.  Musculoskeletal: Negative for myalgias.  Skin: Negative for itching and rash.  Neurological: Negative for dizziness, seizures and loss of consciousness.  Psychiatric/Behavioral: The patient is not nervous/anxious.       Objective:  Blood pressure 133/72, pulse 76, height 5\' 11"  (1.803 m), weight 180 lb 11.2 oz (82 kg), SpO2 98 %. Body mass index is 25.2 kg/m.  Physical Exam  Constitutional: He is oriented to person, place, and time. He appears well-developed and well-nourished.  HENT:  Head: Normocephalic and atraumatic.  Eyes: Conjunctivae are normal.  Neck: JVD (4 cm above clavicle.) present. No thyromegaly present.  Cardiovascular: Normal rate. Exam reveals no gallop.  Murmur (3/6 MSM at apex, 2/6 ESM in aortic and LPSA) heard. Pulses:      Carotid pulses are 2+ on the right side and 2+ on the left side.      Femoral pulses are 2+ on the right side and 2+ on the left side.      Dorsalis pedis pulses are 2+ on the right side and 2+ on the left side.       Posterior tibial pulses are 2+ on the right side and 2+ on the left side.  Irregular rhythm. Heart sounds variable intensity.  Pulmonary/Chest: He has no wheezes. He has no rales.  Abdominal: He exhibits mass (Liver is palpable 3 fingers  below costal margin, nontender). There is no abdominal tenderness.  Musculoskeletal:         General: No edema.     Comments: Varicose veins and spider veins seen in the legs.  Lymphadenopathy:    He has no cervical adenopathy.  Neurological: He is alert and oriented to person, place, and time.  Skin: Skin is warm.    CARDIAC STUDIES:  RHC/LHC 10/17/2017: Nonobstructive coronary artery disease- 40% stenosis mid LAD, 60% second diagonal, 50% stenosis of first obtuse marginal. Nonischemic cardiomyopathy WHO Grp II pulmonary hypertension mPAP 30 mmhg Echocardiogram 11/07/2018: Left ventricle cavity is normal in size. Mild concentric hypertrophy of the left ventricle. Visual EF is 35-40%. Systolic septal flattening due to right ventricular pressure overload. Calculated EF 41%. Unable to evaluate diastolic function due to A. Fibrillation. Left atrial cavity is moderately dilated. Right atrial cavity is severely dilated. Right ventricle cavity is mildly dilated. Low normal right ventricular function. Mild (Grade I) mitral regurgitation. Moderate to severe tricuspid regurgitation. Estimated pulmonary artery systolic pressure 31 mmHg. Compared to previous study on 10/04/2017, LVEF is significantly improved. Mitral regurgitation, pulmonary hypertension, and RV dilatation severity decreased. Lexiscan myoview stress test 08/08/2017: 1. Low risk of hemodynamically significant coronary artery disease. Prognostically, this is a high risk study. The LV is dilated both in rest and stress images with severe global hypokinesis. 2. Rest and stress EKG show A. FIb and occasional PVC. Non diagnostic due to pharmacologic stress. 3. Fixed perfusion defect due to diaphragmatic attenuation. 4. The gated study showed the ejection fraction was <10%.  Assessment & Recommendations:   1. Chronic systolic heart failure ([place])  2. Permanent atrial fibrillation Plan- EKG- Atrial fibrillation with moderate ventricular response. Voltage criteria for LVH  (S(V1)+R(V6) exceeds 3.50 mV).   3. Nonobstructive  atherosclerosis of coronary artery  4. Hypertension with heart disease  5. Nonischemic cardiomyopathy   Laboratory Exam:  10/23/2018-WBC-2.3, hemoglobin-14.6, hematocrit-43.7, platelets-144  10/23/2018-Glucose-94, BUN-12, creatinine-1.23, ZSW-10.  Sodium-145, potassium-4.0  CBC Latest Ref Rng & Units 01/17/2018 10/18/2017 10/17/2017  WBC  4.0 - 10.5 K/uL 2.8(L) 3.3(L) 3.3(L)  Hemoglobin 13.0 - 17.0 g/dL 13.7 14.5 14.1  Hematocrit 39.0 - 52.0 % 41.4 44.4 44.2  Platelets 150.0 - 400.0 K/uL 119.0(L) 105(L) 108(L)   CMP Latest Ref Rng & Units 10/18/2017 10/17/2017 10/16/2017  Glucose 65 - 99 mg/dL 89 86 85  BUN 6 - 20 mg/dL 17 15 13   Creatinine 0.61 - 1.24 mg/dL 1.26(H) 1.31(H) 1.25(H)  Sodium 135 - 145 mmol/L 138 141 141  Potassium 3.5 - 5.1 mmol/L 3.9 3.8 3.4(L)  Chloride 101 - 111 mmol/L 103 99(L) 102  CO2 22 - 32 mmol/L 28 34(H) 31  Calcium 8.9 - 10.3 mg/dL 8.8(L) 8.8(L) 8.7(L)  Total Protein 6.5 - 8.1 g/dL - - -  Total Bilirubin 0.3 - 1.2 mg/dL - - -  Alkaline Phos 38 - 126 U/L - - -  AST 15 - 41 U/L - - -  ALT 17 - 63 U/L - - -   Lipid Panel     Component Value Date/Time   CHOL 86 03/25/2014 0235   TRIG 60 03/25/2014 0235   HDL 35 (L) 03/25/2014 0235   CHOLHDL 2.5 03/25/2014 0235   VLDL 12 03/25/2014 0235   LDLCALC 39 03/25/2014 0235     Recommendation:  Patient remains stable symptomatically. CHF appears compensated clinically. He was advised to continue present medications. Checking his medicines, it was revealed that he has run out of furosemide and has not been taking for few months.  I have sent a new prescription for lower dose of 20 mg every morning. Blood pressure is controlled with therapy. Ventricular response with atrial fibrillation is controlled. Continue Xarelto. Bleeding complications of Xarelto therapy were again explained. He was advised to observe and if there is any abnormal or excessive bleeding, he should stop Xarelto and call us. He was also  advised to avoid aspirin, ibuprofen, Naprosyn or other NSAIDs. Patient verbalized understanding.  Patient was advised to follow low-salt diet and continue fluid restriction to 1.5 L daily.  Return for cardiac follow-up after 3 months but call us earlier if there is any worsening of dyspnea, edema or weight gain of 3 pounds or more in 24 hours period. Patient has been scheduled for a BMP to check electrolytes and renal function.  Despina Hick, MD, [facility] 06/12/2019, 10:28 AM [facility] [place] Pager: [phone] Office: [phone] If no answer Cell [phone]

## 2019-06-20 ENCOUNTER — Other Ambulatory Visit: Payer: Self-pay

## 2019-06-20 MED ORDER — METOPROLOL SUCCINATE ER 200 MG PO TB24: 100.0000 mg | ORAL_TABLET | Freq: Two times a day (BID) | ORAL | 0 refills | Status: DC

## 2019-06-27 ENCOUNTER — Other Ambulatory Visit: Payer: Self-pay

## 2019-06-27 ENCOUNTER — Encounter (INDEPENDENT_AMBULATORY_CARE_PROVIDER_SITE_OTHER): Payer: Medicare HMO | Admitting: Ophthalmology

## 2019-06-27 DIAGNOSIS — H353112 Nonexudative age-related macular degeneration, right eye, intermediate dry stage: Secondary | ICD-10-CM | POA: Diagnosis not present

## 2019-06-27 DIAGNOSIS — I10 Essential (primary) hypertension: Secondary | ICD-10-CM

## 2019-06-27 DIAGNOSIS — D3131 Benign neoplasm of right choroid: Secondary | ICD-10-CM

## 2019-06-27 DIAGNOSIS — H35033 Hypertensive retinopathy, bilateral: Secondary | ICD-10-CM | POA: Diagnosis not present

## 2019-06-27 DIAGNOSIS — H59031 Cystoid macular edema following cataract surgery, right eye: Secondary | ICD-10-CM

## 2019-06-27 DIAGNOSIS — H43813 Vitreous degeneration, bilateral: Secondary | ICD-10-CM

## 2019-08-20 ENCOUNTER — Telehealth: Payer: Self-pay | Admitting: Physician Assistant

## 2019-08-20 NOTE — Telephone Encounter (Signed)
Received a call form Mr. Ronald [name]'s wife to schedule a hem appt. Pt has been scheduled to see Ronald [name] on 11/2 at 1:30pm. Mrs. Ronald [name] is aware her husband should arrive 15 minutes early.

## 2019-08-31 ENCOUNTER — Other Ambulatory Visit: Payer: Self-pay | Admitting: Physician Assistant

## 2019-08-31 DIAGNOSIS — D72819 Decreased white blood cell count, unspecified: Secondary | ICD-10-CM

## 2019-09-03 ENCOUNTER — Encounter: Payer: Self-pay | Admitting: Physician Assistant

## 2019-09-03 ENCOUNTER — Inpatient Hospital Stay: Payer: Medicare HMO

## 2019-09-03 ENCOUNTER — Inpatient Hospital Stay: Payer: Medicare HMO | Attending: Physician Assistant | Admitting: Physician Assistant

## 2019-09-03 ENCOUNTER — Telehealth: Payer: Self-pay | Admitting: Physician Assistant

## 2019-09-03 ENCOUNTER — Other Ambulatory Visit: Payer: Self-pay

## 2019-09-03 VITALS — BP 149/93 | HR 87 | Temp 98.3°F | Resp 18 | Ht 71.0 in | Wt 177.6 lb

## 2019-09-03 DIAGNOSIS — K2100 Gastro-esophageal reflux disease with esophagitis, without bleeding: Secondary | ICD-10-CM | POA: Insufficient documentation

## 2019-09-03 DIAGNOSIS — E785 Hyperlipidemia, unspecified: Secondary | ICD-10-CM | POA: Insufficient documentation

## 2019-09-03 DIAGNOSIS — I509 Heart failure, unspecified: Secondary | ICD-10-CM | POA: Diagnosis not present

## 2019-09-03 DIAGNOSIS — D709 Neutropenia, unspecified: Secondary | ICD-10-CM | POA: Diagnosis present

## 2019-09-03 DIAGNOSIS — I4891 Unspecified atrial fibrillation: Secondary | ICD-10-CM | POA: Insufficient documentation

## 2019-09-03 DIAGNOSIS — Z87891 Personal history of nicotine dependence: Secondary | ICD-10-CM | POA: Insufficient documentation

## 2019-09-03 DIAGNOSIS — I110 Hypertensive heart disease with heart failure: Secondary | ICD-10-CM | POA: Diagnosis not present

## 2019-09-03 DIAGNOSIS — Z7901 Long term (current) use of anticoagulants: Secondary | ICD-10-CM | POA: Insufficient documentation

## 2019-09-03 DIAGNOSIS — N400 Enlarged prostate without lower urinary tract symptoms: Secondary | ICD-10-CM | POA: Insufficient documentation

## 2019-09-03 DIAGNOSIS — D696 Thrombocytopenia, unspecified: Secondary | ICD-10-CM | POA: Insufficient documentation

## 2019-09-03 DIAGNOSIS — Z79899 Other long term (current) drug therapy: Secondary | ICD-10-CM | POA: Diagnosis not present

## 2019-09-03 DIAGNOSIS — D72819 Decreased white blood cell count, unspecified: Secondary | ICD-10-CM

## 2019-09-03 DIAGNOSIS — R0989 Other specified symptoms and signs involving the circulatory and respiratory systems: Secondary | ICD-10-CM | POA: Diagnosis not present

## 2019-09-03 LAB — CMP (CANCER CENTER ONLY)
ALT: 8 U/L (ref 0–44)
AST: 13 U/L — ABNORMAL LOW (ref 15–41)
Albumin: 3.8 g/dL (ref 3.5–5.0)
Alkaline Phosphatase: 62 U/L (ref 38–126)
Anion gap: 7 (ref 5–15)
BUN: 16 mg/dL (ref 8–23)
CO2: 31 mmol/L (ref 22–32)
Calcium: 9 mg/dL (ref 8.9–10.3)
Chloride: 107 mmol/L (ref 98–111)
Creatinine: 1.15 mg/dL (ref 0.61–1.24)
GFR, Est AFR Am: >60 mL/min (ref >60)
GFR, Estimated: 59 mL/min — ABNORMAL LOW (ref >60)
Glucose, Bld: 87 mg/dL (ref 70–99)
Potassium: 4.2 mmol/L (ref 3.5–5.1)
Sodium: 145 mmol/L (ref 135–145)
Total Bilirubin: 1.1 mg/dL (ref 0.3–1.2)
Total Protein: 6.6 g/dL (ref 6.5–8.1)

## 2019-09-03 LAB — HEPATITIS PANEL, ACUTE
HCV Ab: NONREACTIVE
Hep A IgM: NONREACTIVE
Hep B C IgM: NONREACTIVE
Hepatitis B Surface Ag: NONREACTIVE

## 2019-09-03 LAB — CBC WITH DIFFERENTIAL (CANCER CENTER ONLY)
Abs Immature Granulocytes: 0 10*3/uL (ref 0.00–0.07)
Basophils Absolute: 0 10*3/uL (ref 0.0–0.1)
Basophils Relative: 0 %
Eosinophils Absolute: 0.1 10*3/uL (ref 0.0–0.5)
Eosinophils Relative: 2 %
HCT: 45.9 % (ref 39.0–52.0)
Hemoglobin: 14.4 g/dL (ref 13.0–17.0)
Immature Granulocytes: 0 %
Lymphocytes Relative: 40 %
Lymphs Abs: 1 10*3/uL (ref 0.7–4.0)
MCH: 29.8 pg (ref 26.0–34.0)
MCHC: 31.4 g/dL (ref 30.0–36.0)
MCV: 95 fL (ref 80.0–100.0)
Monocytes Absolute: 0.3 10*3/uL (ref 0.1–1.0)
Monocytes Relative: 10 %
Neutro Abs: 1.2 10*3/uL — ABNORMAL LOW (ref 1.7–7.7)
Neutrophils Relative %: 48 %
Platelet Count: 124 10*3/uL — ABNORMAL LOW (ref 150–400)
RBC: 4.83 MIL/uL (ref 4.22–5.81)
RDW: 11.9 % (ref 11.5–15.5)
WBC Count: 2.6 10*3/uL — ABNORMAL LOW (ref 4.0–10.5)
nRBC: 0 % (ref 0.0–0.2)

## 2019-09-03 LAB — LACTATE DEHYDROGENASE: LDH: 105 U/L (ref 98–192)

## 2019-09-03 LAB — HIV ANTIBODY (ROUTINE TESTING W REFLEX): HIV Screen 4th Generation wRfx: NONREACTIVE

## 2019-09-03 LAB — FOLATE: Folate: 10.7 ng/mL (ref >5.9)

## 2019-09-03 LAB — VITAMIN B12: Vitamin B-12: 167 pg/mL — ABNORMAL LOW (ref 180–914)

## 2019-09-03 NOTE — Telephone Encounter (Signed)
Scheduled per los. Called and spoke with patient. Confirmed appt 

## 2019-09-03 NOTE — Progress Notes (Signed)
[facility] [phone]   [phone]  CONSULT NOTE  REFERRING PHYSICIAN: Lorenda Cahill PA-C  REASON FOR CONSULTATION:  Leukopenia and Thrombocytopenia  HPI Ronald [name] is a 85 y.o. male with a past medical history significant for B12 deficiency, allergic rhinitis, BPH, hypertension, GERD, hyperlipidemia, esophagitis, atrial fibrillation, and congestive heart failure presents the clinic for evaluation of leukopenia.  The patient was referred to the clinic by his primary care provider after routine labs in July 2020 demonstrated persistent leukopenia with a white blood cell count of 2.4 and thrombocytopenia with a platelet count of 133.  Per chart review, it appears that the patient has had a history of leukopenia and thrombocytopenia dating back to is long as 10 years ago. The patient is here today for evaluation regarding this concern and further work-up.  The patient is feeling fairly well today without any concerning complaints except for persistent sinus/allergic rhinitis.  The patient works as a Development worker, international aid and this has been occurring for approximately 1 year.  He notes some associated runny nose and itchy/watery eyes.  He uses Flonase and levocetirizine for this concern.  He denies any new medications.  He denies any recent infections and denies any worsening, sore throat, skin infections, diarrhea, nausea, vomiting, abdominal pain, or dysuria.  He denies any fever, lymphadenopathy, or weight loss. The patient notes possible night sweats. He states about 1-2 months ago, he woke up several times in the night to use the rest room and noted that his shirt was damp. He denies any history of rheumatological disease or joint pain.  Per chart review, it appears that the patient has a history of vitamin B12 deficiency; however, the patient states that he is not taking anything for this and is unsure if he has a history of vitamin B12 deficiency.   The patient notes that he has a baseline cough which is worse at nighttime.  He denies any shortness of breath or chest pain.  He denies any NSAID use.   Patient's family history is unremarkable/unknown.  The patient states that he does not know of any medical problems that run in his family and states that both of his parents died of "old age". And he denies any family history of hematological disorders.  The patient works as a Development worker, international aid.  He is married and has 3 children.  He denies any drug or alcohol use.  The patient has a smoking history approximately 40 years ago.  He states that he smoked for about a half of a pack per day for about 40 years.  HPI  Past Medical History:  Diagnosis Date   Allergic rhinitis, cause unspecified    BPH (benign prostatic hypertrophy)    Essential hypertension, benign    GERD (gastroesophageal reflux disease)    Glaucoma    Hepatic cyst    Hiatal hernia 2014   History of gout    Hyperlipidemia    Irritable bowel syndrome    Lactose intolerance    Personal history of colonic polyps 03/04/1992   adenomatous polyp   Pneumonia    "~ 5 times in my lifetime; last time was 03/2014" (10/14/2017)   Reflux esophagitis    Schatzki's ring 2014   Sleep apnea    Vitamin B12 deficiency     Past Surgical History:  Procedure Laterality Date   CATARACT EXTRACTION W/ INTRAOCULAR LENS IMPLANT Left    HYDROCELE EXCISION Bilateral 10/2016   Archie Endo 10/11/2016   INTRAVASCULAR  PRESSURE WIRE/FFR STUDY N/A 10/17/2017   Procedure: INTRAVASCULAR PRESSURE WIRE/FFR STUDY;  Surgeon: Nigel Mormon, MD;  Location: [facility];  Service: Cardiovascular;  Laterality: N/A;   NASAL RECONSTRUCTION     "tried to fix my nose so I can smell; still can't smell" (10/14/2017)   RIGHT/LEFT HEART CATH AND CORONARY ANGIOGRAPHY N/A 10/17/2017   Procedure: RIGHT/LEFT HEART CATH AND CORONARY ANGIOGRAPHY;  Surgeon: Nigel Mormon, MD;  Location: [facility];  Service: Cardiovascular;  Laterality: N/A;   TRANSURETHRAL RESECTION OF PROSTATE  10/2007   Archie Endo 08/22/2007   ULTRASOUND GUIDANCE FOR VASCULAR ACCESS  10/17/2017   Procedure: Ultrasound Guidance For Vascular Access;  Surgeon: Nigel Mormon, MD;  Location: [facility];  Service: Cardiovascular;;    Family History  Problem Relation Age of Onset   Heart disease Sister    Colon cancer Neg Hx    Esophageal cancer Neg Hx    Diabetes Neg Hx     Social History Social History   Tobacco Use   Smoking status: Former Smoker    Packs/day: 1.00    Years: 30.00    Pack years: 30.00    Types: Cigarettes    Quit date: 11/02/1983    Years since quitting: 35.8   Smokeless tobacco: Never Used  Substance Use Topics   Alcohol use: No    Frequency: Never   Drug use: No    Allergies  Allergen Reactions   Lactose Intolerance (Gi) Other (See Comments)    GI upset    Current Outpatient Medications  Medication Sig Dispense Refill   fluticasone (FLONASE) 50 MCG/ACT nasal spray 1 spray each nostril 1-2 times daily     furosemide (LASIX) 20 MG tablet Take 1 tablet (20 mg total) by mouth daily. 90 tablet 3   levocetirizine (XYZAL) 5 MG tablet Take 5 mg by mouth at bedtime.     metoprolol (TOPROL-XL) 200 MG 24 hr tablet Take 0.5 tablets (100 mg total) by mouth 2 (two) times daily. 180 tablet 0   montelukast (SINGULAIR) 10 MG tablet Take by mouth daily.     Polyethyl Glyc-Propyl Glyc PF (SYSTANE HYDRATION PF) 0.4-0.3 % SOLN Apply to eye.     potassium chloride (KLOR-CON) 10 MEQ tablet Take 10 mEq by mouth once.     prednisoLONE acetate (PRED FORTE) 1 % ophthalmic suspension INSTILL ONE DROP INTO THE RIGHT EYE 4 TIMES A DAY     rivaroxaban (XARELTO) 20 MG TABS tablet Take 20 mg by mouth daily with supper.     sacubitril-valsartan (ENTRESTO) 97-103 MG Take 1 tablet by mouth 2 (two) times daily.     spironolactone (ALDACTONE) 50 MG tablet Take 50 mg  by mouth daily.     Current Facility-Administered Medications  Medication Dose Route Frequency Provider Last Rate Last Dose   cyanocobalamin ((VITAMIN B-12)) injection 1,000 mcg  1,000 mcg Intramuscular Q30 days Sable Feil, MD   1,000 mcg at 12/04/12 7001    Review of Systems Review of Systems  Constitutional: Negative for fever, unexpected weight loss, chills, fatigue, and fever HENT: Positive for nasal congestion. Negative for mouth sores, nosebleeds, sore throat and trouble swallowing.   Eyes: Positive for itchy and watery eyes. Negative for icterus.  Respiratory: Negative for hemoptysis, shortness of breath with exertion, cough, and wheezing.   Cardiovascular: Negative for chest pain and leg swelling.  Gastrointestinal: Negative for abdominal pain, nausea, constipation, diarrhea, and vomiting.  Genitourinary: Negative for bladder  incontinence, difficulty urinating, dysuria, frequency and hematuria.   Musculoskeletal: Negative for back pain, gait problem, neck pain and neck stiffness.  Skin: Negative for itching and rash.  Neurological: Negative for extremity weakness, gait problem,  light-headedness and seizures.  Hematological: Negative for adenopathy. Does not bruise/bleed easily.  Psychiatric/Behavioral: Negative for confusion, depression and sleep disturbance. The patient is not nervous/anxious.    Physical Exam Constitutional: Oriented to person, place, and time and well-developed, well-nourished, and in no distress.  HENT:  Head: Normocephalic and atraumatic.  Mouth/Throat: Oropharynx is clear and moist. No oropharyngeal exudate.  Eyes: Conjunctivae are normal. Right eye exhibits no discharge. Left eye exhibits no discharge. No scleral icterus.  Neck: Normal range of motion. Neck supple.  Cardiovascular: Normal rate, regular rhythm, normal heart sounds and intact distal pulses.   Pulmonary/Chest: Effort normal and breath sounds normal. No respiratory distress. No  wheezes. No rales Abdominal: Soft. Bowel sounds are normal. Exhibits no distension and no mass. There is no tenderness.  Musculoskeletal: Normal range of motion. Exhibits no edema.  Lymphadenopathy:    No cervical adenopathy.  Neurological: Alert and oriented to person, place, and time. Exhibits normal muscle tone. Gait normal. Coordination normal.  Skin: Skin is warm and dry. No rash noted. Not diaphoretic. No erythema. No pallor.  Psychiatric: Mood, memory and judgment normal.  Vitals reviewed.  PERFORMANCE STATUS: ECOG 0  LABORATORY DATA: Lab Results  Component Value Date   WBC 2.6 (L) 09/03/2019   HGB 14.4 09/03/2019   HCT 45.9 09/03/2019   MCV 95.0 09/03/2019   PLT 124 (L) 09/03/2019      Chemistry      Component Value Date/Time   NA 145 09/03/2019 1319   K 4.2 09/03/2019 1319   CL 107 09/03/2019 1319   CO2 31 09/03/2019 1319   BUN 16 09/03/2019 1319   CREATININE 1.15 09/03/2019 1319      Component Value Date/Time   CALCIUM 9.0 09/03/2019 1319   ALKPHOS 62 09/03/2019 1319   AST 13 (L) 09/03/2019 1319   ALT 8 09/03/2019 1319   BILITOT 1.1 09/03/2019 1319       RADIOGRAPHIC STUDIES: No results found.  ASSESSMENT: This is a very pleasant 85 year old African-American male referred to the clinic for evaluation of thrombocytopenia and leukopenia dating back at least 10 years.   PLAN: The patient was seen with Dr. Julien Nordmann today.  Labs were reviewed which showed persistent leukopenia with a white blood cell count of 2.6 as well as thrombocytopenia with a platelet count of 124,000.  Dr. Julien Nordmann recommends further lab studies to further evaluate his condition.  We will arrange for the patient to have a hepatitis panel performed, rheumatoid factor, ANA with reflex testing, ferritin, iron studies, folate, B12, and HIV testing.  Given that the patient has thrombocytopenia as well as neutropenia, Dr. Julien Nordmann recommends that we evaluate the patient for a underlying bone  marrow disorder.  We will arrange for the patient to have a bone marrow biopsy and aspirate performed to further assess his condition.  We will see the patient back for follow-up visit in 2 weeks for evaluation, to review his lab work, and to review his bone marrow biopsy results.  The patient voices understanding of current disease status and treatment options and is in agreement with the current care plan.  All questions were answered. The patient knows to call the clinic with any problems, questions or concerns. We can certainly see the patient much sooner if necessary.  Thank you so much for allowing me to participate in the care of Ronald [name]. I will continue to follow up the patient with you and assist in his care.  I spent 40 minutes counseling the patient face to face. The total time spent in the appointment was 60 minutes.  Disclaimer: This note was dictated with voice recognition software. Similar sounding words can inadvertently be transcribed and may not be corrected upon review.   Conlee Sliter L Brynden Thune September 03, 2019, 4:36 PM  ADDENDUM: Hematology/Oncology Attending: I had a face-to-face encounter with the patient today.  I recommended his care plan.  This is a very pleasant 85 years old African-American male who was referred to [place] by his primary care physician for evaluation of leukocytopenia and thrombocytopenia.  The patient was noticed on recent blood work to have total white blood count of 2.4 and platelets count of 133,000. Reviewing his previous record, the patient had similar finding for at least the last 5-10 years.  He has been feeling fine with no concerning issues and no bleeding problems.  He has no recurrent infection.  He has no current medications that could contribute to his leukocytopenia and thrombocytopenia. I had a lengthy discussion with the patient today about his condition and further investigation to identify the etiology of his disease. I  recommended for the patient to have few studies done today including repeat CBC, comprehensive metabolic panel, LDH, iron study and ferritin as well as serum folate and serum vitamin B12 level in addition to ANA, rheumatoid factor, hepatitis panel as well as HIV. Because of the bicytopenia, will arrange for the patient to have a bone marrow biopsy and aspirate to rule out any underlying myelodysplasia. The patient will come back for follow-up visit in 2 weeks for evaluation and discussion of his lab results and further recommendation regarding his condition. He was advised to call immediately if he has any concerning symptoms in the interval.  Disclaimer: This note was dictated with voice recognition software. Similar sounding words can inadvertently be transcribed and may be missed upon review. Eilleen Kempf, MD 09/03/19

## 2019-09-04 LAB — IRON AND TIBC
Iron: 85 ug/dL (ref 42–163)
Saturation Ratios: 33 % (ref 20–55)
TIBC: 255 ug/dL (ref 202–409)
UIBC: 170 ug/dL (ref 117–376)

## 2019-09-04 LAB — FERRITIN: Ferritin: 122 ng/mL (ref 24–336)

## 2019-09-04 LAB — RHEUMATOID FACTOR: Rheumatoid fact SerPl-aCnc: <10 IU/mL (ref 0.0–13.9)

## 2019-09-05 LAB — ANTINUCLEAR ANTIBODIES, IFA: ANA Ab, IFA: NEGATIVE

## 2019-09-11 ENCOUNTER — Telehealth: Payer: Self-pay | Admitting: Physician Assistant

## 2019-09-11 ENCOUNTER — Telehealth: Payer: Self-pay

## 2019-09-11 NOTE — Telephone Encounter (Signed)
Schedule message sent to add patient on for a BMBX on 11/17 at 7:30 am.  Spoke with Estill Bamberg in cytometry for 8 am lab appt.  LCC/NP schedule blocked. Patient is aware of date/time.

## 2019-09-11 NOTE — Telephone Encounter (Signed)
Called pt per 11/10 sch message - left message for patient that appt was rescheduled from 11/16 to 11/23

## 2019-09-13 ENCOUNTER — Other Ambulatory Visit: Payer: Medicare HMO

## 2019-09-17 ENCOUNTER — Ambulatory Visit: Payer: Medicare HMO | Admitting: Physician Assistant

## 2019-09-17 ENCOUNTER — Ambulatory Visit: Payer: Medicare HMO | Admitting: Cardiology

## 2019-09-17 ENCOUNTER — Other Ambulatory Visit: Payer: Medicare HMO

## 2019-09-17 ENCOUNTER — Other Ambulatory Visit: Payer: Self-pay | Admitting: Physician Assistant

## 2019-09-17 DIAGNOSIS — D696 Thrombocytopenia, unspecified: Secondary | ICD-10-CM

## 2019-09-17 DIAGNOSIS — D709 Neutropenia, unspecified: Secondary | ICD-10-CM

## 2019-09-18 ENCOUNTER — Inpatient Hospital Stay (HOSPITAL_BASED_OUTPATIENT_CLINIC_OR_DEPARTMENT_OTHER): Payer: Medicare HMO | Admitting: Adult Health

## 2019-09-18 ENCOUNTER — Inpatient Hospital Stay: Payer: Medicare HMO

## 2019-09-18 ENCOUNTER — Other Ambulatory Visit: Payer: Self-pay

## 2019-09-18 VITALS — BP 153/73 | HR 70 | Temp 98.9°F | Resp 16 | Ht 71.0 in | Wt 177.6 lb

## 2019-09-18 DIAGNOSIS — D708 Other neutropenia: Secondary | ICD-10-CM

## 2019-09-18 DIAGNOSIS — D709 Neutropenia, unspecified: Secondary | ICD-10-CM | POA: Diagnosis not present

## 2019-09-18 DIAGNOSIS — D696 Thrombocytopenia, unspecified: Secondary | ICD-10-CM

## 2019-09-18 LAB — CBC WITH DIFFERENTIAL (CANCER CENTER ONLY)
Abs Immature Granulocytes: 0.01 10*3/uL (ref 0.00–0.07)
Basophils Absolute: 0 10*3/uL (ref 0.0–0.1)
Basophils Relative: 1 %
Eosinophils Absolute: 0.1 10*3/uL (ref 0.0–0.5)
Eosinophils Relative: 2 %
HCT: 42.8 % (ref 39.0–52.0)
Hemoglobin: 13.7 g/dL (ref 13.0–17.0)
Immature Granulocytes: 1 %
Lymphocytes Relative: 39 %
Lymphs Abs: 0.9 10*3/uL (ref 0.7–4.0)
MCH: 30.3 pg (ref 26.0–34.0)
MCHC: 32 g/dL (ref 30.0–36.0)
MCV: 94.7 fL (ref 80.0–100.0)
Monocytes Absolute: 0.2 10*3/uL (ref 0.1–1.0)
Monocytes Relative: 10 %
Neutro Abs: 1.1 10*3/uL — ABNORMAL LOW (ref 1.7–7.7)
Neutrophils Relative %: 47 %
Platelet Count: 113 10*3/uL — ABNORMAL LOW (ref 150–400)
RBC: 4.52 MIL/uL (ref 4.22–5.81)
RDW: 11.9 % (ref 11.5–15.5)
WBC Count: 2.2 10*3/uL — ABNORMAL LOW (ref 4.0–10.5)
nRBC: 0 % (ref 0.0–0.2)

## 2019-09-18 NOTE — Progress Notes (Signed)
INDICATION:neutropenia and thrombocytopenia   Bone Marrow Biopsy and Aspiration Procedure Note   Informed consent was obtained and potential risks including bleeding, infection and pain were reviewed with the patient.  The patient's name, date of birth, identification, consent and allergies were verified prior to the start of procedure and time out was performed.  The left posterior iliac crest was chosen as the site of biopsy.  The skin was prepped with ChloraPrep.   8 cc of 2% lidocaine was used to provide local anaesthesia.   10 cc of bone marrow aspirate was obtained followed by 1cm biopsy.  Pressure was applied to the biopsy site and bandage was placed over the biopsy site. Patient was made to lie on the back for 30 mins prior to discharge.  The procedure was tolerated well. COMPLICATIONS: None BLOOD LOSS: none The patient was discharged home in stable condition with a 1 week follow up to review results.  Patient was provided with post bone marrow biopsy instructions and instructed to call if there was any bleeding or worsening pain.  Specimens sent for flow cytometry, cytogenetics and additional studies.  Signed Scot Dock, NP

## 2019-09-18 NOTE — Progress Notes (Signed)
Bone Marrow biopsy and aspiration completed. Pt. tolerated well. Denies complaints of pain and no shortness of breath noted. Dressing remains clean, dry, and intact. Discharge instructions given and reviewed with pt.

## 2019-09-18 NOTE — Patient Instructions (Signed)
Bone Marrow Aspiration and Bone Marrow Biopsy, Adult, Care After This sheet gives you information about how to care for yourself after your procedure. Your health care provider may also give you more specific instructions. If you have problems or questions, contact your health care provider. What can I expect after the procedure? After the procedure, it is common to have:  Mild pain and tenderness.  Swelling.  Bruising. Follow these instructions at home: Puncture site care      Follow instructions from your health care provider about how to take care of the puncture site. Make sure you: ? Wash your hands with soap and water before you change your bandage (dressing). If soap and water are not available, use hand sanitizer. ? Change your dressing as told by your health care provider.  Check your puncture siteevery day for signs of infection. Check for: ? More redness, swelling, or pain. ? More fluid or blood. ? Warmth. ? Pus or a bad smell. General instructions  Take over-the-counter and prescription medicines only as told by your health care provider.  Do not take baths, swim, or use a hot tub until your health care provider approves. Ask if you can take a shower or have a sponge bath.  Return to your normal activities as told by your health care provider. Ask your health care provider what activities are safe for you.  Do not drive for 24 hours if you were given a medicine to help you relax (sedative) during your procedure.  Keep all follow-up visits as told by your health care provider. This is important. Contact a health care provider if:  Your pain is not controlled with medicine. Get help right away if:  You have a fever.  You have more redness, swelling, or pain around the puncture site.  You have more fluid or blood coming from the puncture site.  Your puncture site feels warm to the touch.  You have pus or a bad smell coming from the puncture site. These  symptoms may represent a serious problem that is an emergency. Do not wait to see if the symptoms will go away. Get medical help right away. Call your local emergency services (911 in the [place] Do not drive yourself to the hospital. Summary  After the procedure, it is common to have mild pain, tenderness, swelling, and bruising.  Follow instructions from your health care provider about how to take care of the puncture site.  Get help right away if you have any symptoms of infection or if you have more blood or fluid coming from the puncture site. This information is not intended to replace advice given to you by your health care provider. Make sure you discuss any questions you have with your health care provider. Document Released: 05/07/2005 Document Revised: 01/31/2018 Document Reviewed: 03/31/2016 Elsevier Patient Education  2020 [employer]  Coronavirus (COVID-19) Are you at risk?  Are you at risk for the Coronavirus (COVID-19)?  To be considered HIGH RISK for Coronavirus (COVID-19), you have to meet the following criteria:  . Traveled to [place], [place], [place], [place] or [place]; or in the [place] to [place], [place], [place], or [place]; and have fever, cough, and shortness of breath within the last 2 weeks of travel OR . Been in close contact with a person diagnosed with COVID-19 within the last 2 weeks and have fever, cough, and shortness of breath . IF YOU DO NOT MEET THESE CRITERIA, YOU ARE CONSIDERED LOW RISK   FOR COVID-19.  What to do if you are HIGH RISK for COVID-19?  . If you are having a medical emergency, call 911. . Seek medical care right away. Before you go to a doctor's office, urgent care or emergency department, call ahead and tell them about your recent travel, contact with someone diagnosed with COVID-19, and your symptoms. You should receive instructions from your physician's office regarding next steps of care.  . When you arrive at healthcare  provider, tell the healthcare staff immediately you have returned from visiting [place], [place], [place], [place] or [place]; or traveled in the [place] to [place], [place], [place], or [place]; in the last two weeks or you have been in close contact with a person diagnosed with COVID-19 in the last 2 weeks.   . Tell the health care staff about your symptoms: fever, cough and shortness of breath. . After you have been seen by a medical provider, you will be either: o Tested for (COVID-19) and discharged home on quarantine except to seek medical care if symptoms worsen, and asked to  - Stay home and avoid contact with others until you get your results (4-5 days)  - Avoid travel on public transportation if possible (such as bus, train, or airplane) or o Sent to the Emergency Department by EMS for evaluation, COVID-19 testing, and possible admission depending on your condition and test results.  What to do if you are LOW RISK for COVID-19?  Reduce your risk of any infection by using the same precautions used for avoiding the common cold or flu:  . Wash your hands often with soap and warm water for at least 20 seconds.  If soap and water are not readily available, use an alcohol-based hand sanitizer with at least 60% alcohol.  . If coughing or sneezing, cover your mouth and nose by coughing or sneezing into the elbow areas of your shirt or coat, into a tissue or into your sleeve (not your hands). . Avoid shaking hands with others and consider head nods or verbal greetings only. . Avoid touching your eyes, nose, or mouth with unwashed hands.  . Avoid close contact with people who are sick. . Avoid places or events with large numbers of people in one location, like concerts or sporting events. . Carefully consider travel plans you have or are making. . If you are planning any travel outside or inside the [place], visit the CDC's Travelers' Health webpage for the latest health notices. . If you  have some symptoms but not all symptoms, continue to monitor at home and seek medical attention if your symptoms worsen. . If you are having a medical emergency, call 911.   ADDITIONAL HEALTHCARE OPTIONS FOR PATIENTS  [facility] / e-Visit: [email or web address]         [facility]: [phone]  [facility]: [phone]                   [facility]: [phone]   

## 2019-09-24 ENCOUNTER — Encounter: Payer: Self-pay | Admitting: Physician Assistant

## 2019-09-24 ENCOUNTER — Inpatient Hospital Stay: Payer: Medicare HMO | Admitting: Physician Assistant

## 2019-09-24 ENCOUNTER — Other Ambulatory Visit: Payer: Self-pay

## 2019-09-24 VITALS — BP 131/80 | HR 72 | Temp 98.2°F | Resp 18 | Ht 71.0 in | Wt 179.7 lb

## 2019-09-24 DIAGNOSIS — D696 Thrombocytopenia, unspecified: Secondary | ICD-10-CM

## 2019-09-24 DIAGNOSIS — D709 Neutropenia, unspecified: Secondary | ICD-10-CM | POA: Insufficient documentation

## 2019-09-24 NOTE — Progress Notes (Signed)
[facility] OFFICE PROGRESS NOTE  Aletha Halim., PA-C [address] [place] [address]  DIAGNOSIS: Neutropenia and Thrombocytopenia   PRIOR THERAPY: None  CURRENT THERAPY: Observation  INTERVAL HISTORY: Ronald [name] 85 y.o. male returns to clinic today for a follow-up visit.  The patient is feeling well today without any concerning complaints.  The patient denies any fever, chills, or weight loss.  He has not had any recent episodes of night sweats besides the episode of night sweats approximately 2 months ago.  He denies any signs and symptoms of infection including dysuria, skin infections, cough, sore throat, or nasal congestion.  He denies any nausea, vomiting, diarrhea, or constipation.  He denies any lymphadenopathy.  He denies any bleeding or bruising including melena, hematochezia, epistaxis, gingival bleeding, or hematuria.  The patient recently had a bone marrow biopsy performed to further evaluate his leukocytopenia and thrombocytopenia.  He is here today for evaluation and to review his bone marrow biopsy results.  MEDICAL HISTORY: Past Medical History:  Diagnosis Date  . Allergic rhinitis, cause unspecified   . BPH (benign prostatic hypertrophy)   . Essential hypertension, benign   . GERD (gastroesophageal reflux disease)   . Glaucoma   . Hepatic cyst   . Hiatal hernia 2014  . History of gout   . Hyperlipidemia   . Irritable bowel syndrome   . Lactose intolerance   . Personal history of colonic polyps 03/04/1992   adenomatous polyp  . Pneumonia    "~ 5 times in my lifetime; last time was 03/2014" (10/14/2017)  . Reflux esophagitis   . Schatzki's ring 2014  . Sleep apnea   . Vitamin B12 deficiency     ALLERGIES:  is allergic to lactose intolerance (gi).  MEDICATIONS:  Current Outpatient Medications  Medication Sig Dispense Refill  . fluticasone (FLONASE) 50 MCG/ACT nasal spray 1 spray each nostril 1-2 times daily    .  levocetirizine (XYZAL) 5 MG tablet Take 5 mg by mouth at bedtime.    . metoprolol (TOPROL-XL) 200 MG 24 hr tablet Take 0.5 tablets (100 mg total) by mouth 2 (two) times daily. 180 tablet 0  . montelukast (SINGULAIR) 10 MG tablet Take by mouth daily.    Vladimir Faster Glyc-Propyl Glyc PF (SYSTANE HYDRATION PF) 0.4-0.3 % SOLN Apply to eye.    . potassium chloride (KLOR-CON) 10 MEQ tablet Take 10 mEq by mouth once.    . prednisoLONE acetate (PRED FORTE) 1 % ophthalmic suspension INSTILL ONE DROP INTO THE RIGHT EYE 4 TIMES A DAY    . rivaroxaban (XARELTO) 20 MG TABS tablet Take 20 mg by mouth daily with supper.    . sacubitril-valsartan (ENTRESTO) 97-103 MG Take 1 tablet by mouth 2 (two) times daily.    [name] spironolactone (ALDACTONE) 50 MG tablet Take 50 mg by mouth daily.    . furosemide (LASIX) 20 MG tablet Take 1 tablet (20 mg total) by mouth daily. 90 tablet 3   Current Facility-Administered Medications  Medication Dose Route Frequency Provider Last Rate Last Dose  . cyanocobalamin ((VITAMIN B-12)) injection 1,000 mcg  1,000 mcg Intramuscular Q30 days Sable Feil, MD   1,000 mcg at 12/04/12 2800    SURGICAL HISTORY:  Past Surgical History:  Procedure Laterality Date  . CATARACT EXTRACTION W/ INTRAOCULAR LENS IMPLANT Left   . HYDROCELE EXCISION Bilateral 10/2016   Archie Endo 10/11/2016  . INTRAVASCULAR PRESSURE WIRE/FFR STUDY N/A 10/17/2017   Procedure: INTRAVASCULAR PRESSURE WIRE/FFR STUDY;  Surgeon: Nigel Mormon, MD;  Location: [facility];  Service: Cardiovascular;  Laterality: N/A;  . NASAL RECONSTRUCTION     "tried to fix my nose so I can smell; still can't smell" (10/14/2017)  . RIGHT/LEFT HEART CATH AND CORONARY ANGIOGRAPHY N/A 10/17/2017   Procedure: RIGHT/LEFT HEART CATH AND CORONARY ANGIOGRAPHY;  Surgeon: Nigel Mormon, MD;  Location: [facility];  Service: Cardiovascular;  Laterality: N/A;  . TRANSURETHRAL RESECTION OF PROSTATE  10/2007   Archie Endo  08/22/2007  . ULTRASOUND GUIDANCE FOR VASCULAR ACCESS  10/17/2017   Procedure: Ultrasound Guidance For Vascular Access;  Surgeon: Nigel Mormon, MD;  Location: [facility];  Service: Cardiovascular;;    REVIEW OF SYSTEMS:   Review of Systems  Constitutional: Negative for appetite change, chills, fatigue, fever and unexpected weight change.  HENT:   Negative for mouth sores, nosebleeds, sore throat and trouble swallowing.   Eyes: Negative for eye problems and icterus.  Respiratory: Negative for cough, hemoptysis, shortness of breath and wheezing.   Cardiovascular: Negative for chest pain and leg swelling.  Gastrointestinal: Negative for abdominal pain, constipation, diarrhea, nausea and vomiting.  Genitourinary: Negative for bladder incontinence, difficulty urinating, dysuria, frequency and hematuria.   Musculoskeletal: Negative for back pain, gait problem, neck pain and neck stiffness.  Skin: Negative for itching and rash.  Neurological: Negative for dizziness, extremity weakness, gait problem, headaches, light-headedness and seizures.  Hematological: Negative for adenopathy. Does not bruise/bleed easily.  Psychiatric/Behavioral: Negative for confusion, depression and sleep disturbance. The patient is not nervous/anxious.     PHYSICAL EXAMINATION:  Blood pressure 131/80, pulse 72, temperature 98.2 F (36.8 C), resp. rate 18, height 5' 11" (1.803 m), weight 179 lb 11.2 oz (81.5 kg), SpO2 98 %.  ECOG PERFORMANCE STATUS: 0 - Asymptomatic  Physical Exam  Constitutional: Oriented to person, place, and time and well-developed, well-nourished, and in no distress.  HENT:  Head: Normocephalic and atraumatic.  Mouth/Throat: Oropharynx is clear and moist. No oropharyngeal exudate.  Eyes: Conjunctivae are normal. Right eye exhibits no discharge. Left eye exhibits no discharge. No scleral icterus.  Neck: Normal range of motion. Neck supple.  Cardiovascular: Normal rate, irregular  rhythm (Hx of Afib), normal heart sounds and intact distal pulses.   Pulmonary/Chest: Effort normal and breath sounds normal. No respiratory distress. No wheezes. No rales.  Abdominal: Soft. Bowel sounds are normal. Exhibits no distension and no mass. There is no tenderness.  Musculoskeletal: Normal range of motion. Exhibits no edema.  Lymphadenopathy:    No cervical adenopathy.  Neurological: Alert and oriented to person, place, and time. Exhibits normal muscle tone. Gait normal. Coordination normal.  Skin: Skin is warm and dry. No rash noted. Not diaphoretic. No erythema. No pallor.  Psychiatric: Mood, memory and judgment normal.  Vitals reviewed.  LABORATORY DATA: Lab Results  Component Value Date   WBC 2.2 (L) 09/18/2019   HGB 13.7 09/18/2019   HCT 42.8 09/18/2019   MCV 94.7 09/18/2019   PLT 113 (L) 09/18/2019      Chemistry      Component Value Date/Time   NA 145 09/03/2019 1319   K 4.2 09/03/2019 1319   CL 107 09/03/2019 1319   CO2 31 09/03/2019 1319   BUN 16 09/03/2019 1319   CREATININE 1.15 09/03/2019 1319      Component Value Date/Time   CALCIUM 9.0 09/03/2019 1319   ALKPHOS 62 09/03/2019 1319   AST 13 (L) 09/03/2019 1319   ALT 8 09/03/2019  1319   BILITOT 1.1 09/03/2019 1319       RADIOGRAPHIC STUDIES:  No results found.   ASSESSMENT/PLAN:  This is a very pleasant 85 year old African-American male who was referred to the clinic for evaluation of leukocytopenia and thrombocytopenia.   The patient was seen with Dr. Julien Nordmann today.  The patient had a repeat CBC performed 1 week ago during his bone marrow biopsy which showed persistent leukocytopenia with a total white blood cell count of 2.2 and thrombocytopenia with a platelet count of 113,000.  The patient is asymptomatic without any signs of infections or bleeding.   The patient had bone marrow biopsy performed to rule out myelodysplastic syndrome given the bicytopenia.  The patient's bone marrow biopsy  was unremarkable.  Dr. Julien Nordmann recommends the patient continue on observation a repeat CBC and CMP in 3 months.  The patient was advised to call immediately if he has any concerning symptoms in the interval. The patient voices understanding of current disease status and treatment options and is in agreement with the current care plan. All questions were answered. The patient knows to call the clinic with any problems, questions or concerns. We can certainly see the patient much sooner if necessary   Orders Placed This Encounter  Procedures  . CBC with Differential ([facility] Center Only)    Standing Status:   Future    Standing Expiration Date:   09/23/2020  . CMP ([facility] only)    Standing Status:   Future    Standing Expiration Date:   09/23/2020     Tobe Sos , PA-C 09/24/19  ADDENDUM: Hematology/Oncology Attending: I had a face-to-face encounter with the patient today.  I recommended his care plan.  This is a very pleasant 85 years old African-American male with persistent leukocytopenia of unknown etiology, probably ethnic in origin versus medication induced.  The patient underwent several studies including a bone marrow biopsy and aspirate as well as serologic testing for rheumatoid factor, ANA, hepatitis panel, vitamin B12 level, serum folate as well as iron study and ferritin.  These tests were unremarkable for any abnormality. I discussed the lab results with the patient and recommended for him to continue on observation with repeat CBC and comprehensive metabolic panel in 3 months. The patient is asymptomatic and has no evidence for recurrent infection and continuous observation is reasonable in his condition. He was advised to call immediately if he has any concerning symptoms in the interval.  Disclaimer: This note was dictated with voice recognition software. Similar sounding words can inadvertently be transcribed and may be missed upon review. Eilleen Kempf, MD 09/24/19

## 2019-09-25 ENCOUNTER — Telehealth: Payer: Self-pay | Admitting: Internal Medicine

## 2019-09-25 ENCOUNTER — Encounter (HOSPITAL_COMMUNITY): Payer: Self-pay | Admitting: Internal Medicine

## 2019-09-25 NOTE — Telephone Encounter (Signed)
Scheduled per los. Called and left msg. Mailed printout  °

## 2019-10-01 LAB — SURGICAL PATHOLOGY

## 2019-11-05 ENCOUNTER — Encounter (INDEPENDENT_AMBULATORY_CARE_PROVIDER_SITE_OTHER): Payer: Medicare HMO | Admitting: Ophthalmology

## 2019-12-25 ENCOUNTER — Other Ambulatory Visit: Payer: Self-pay | Admitting: Cardiology

## 2019-12-26 ENCOUNTER — Inpatient Hospital Stay: Payer: Medicare HMO

## 2019-12-26 ENCOUNTER — Telehealth: Payer: Self-pay | Admitting: Internal Medicine

## 2019-12-26 ENCOUNTER — Inpatient Hospital Stay: Payer: Medicare HMO | Attending: Physician Assistant | Admitting: Internal Medicine

## 2019-12-26 ENCOUNTER — Encounter: Payer: Self-pay | Admitting: Internal Medicine

## 2019-12-26 ENCOUNTER — Other Ambulatory Visit: Payer: Self-pay

## 2019-12-26 VITALS — BP 132/91 | HR 64 | Temp 98.0°F | Resp 18 | Ht 71.0 in | Wt 181.3 lb

## 2019-12-26 DIAGNOSIS — E538 Deficiency of other specified B group vitamins: Secondary | ICD-10-CM | POA: Insufficient documentation

## 2019-12-26 DIAGNOSIS — R5383 Other fatigue: Secondary | ICD-10-CM | POA: Insufficient documentation

## 2019-12-26 DIAGNOSIS — R200 Anesthesia of skin: Secondary | ICD-10-CM | POA: Insufficient documentation

## 2019-12-26 DIAGNOSIS — D696 Thrombocytopenia, unspecified: Secondary | ICD-10-CM

## 2019-12-26 DIAGNOSIS — Z79899 Other long term (current) drug therapy: Secondary | ICD-10-CM | POA: Diagnosis not present

## 2019-12-26 DIAGNOSIS — Z8719 Personal history of other diseases of the digestive system: Secondary | ICD-10-CM | POA: Diagnosis not present

## 2019-12-26 DIAGNOSIS — Z7901 Long term (current) use of anticoagulants: Secondary | ICD-10-CM | POA: Insufficient documentation

## 2019-12-26 DIAGNOSIS — G4730 Sleep apnea, unspecified: Secondary | ICD-10-CM | POA: Insufficient documentation

## 2019-12-26 DIAGNOSIS — D72819 Decreased white blood cell count, unspecified: Secondary | ICD-10-CM | POA: Insufficient documentation

## 2019-12-26 DIAGNOSIS — K219 Gastro-esophageal reflux disease without esophagitis: Secondary | ICD-10-CM | POA: Insufficient documentation

## 2019-12-26 DIAGNOSIS — H409 Unspecified glaucoma: Secondary | ICD-10-CM | POA: Diagnosis not present

## 2019-12-26 DIAGNOSIS — N400 Enlarged prostate without lower urinary tract symptoms: Secondary | ICD-10-CM | POA: Diagnosis not present

## 2019-12-26 DIAGNOSIS — D709 Neutropenia, unspecified: Secondary | ICD-10-CM | POA: Diagnosis present

## 2019-12-26 DIAGNOSIS — I10 Essential (primary) hypertension: Secondary | ICD-10-CM | POA: Diagnosis not present

## 2019-12-26 DIAGNOSIS — E785 Hyperlipidemia, unspecified: Secondary | ICD-10-CM | POA: Insufficient documentation

## 2019-12-26 LAB — CBC WITH DIFFERENTIAL (CANCER CENTER ONLY)
Abs Immature Granulocytes: 0 10*3/uL (ref 0.00–0.07)
Basophils Absolute: 0 10*3/uL (ref 0.0–0.1)
Basophils Relative: 0 %
Eosinophils Absolute: 0.1 10*3/uL (ref 0.0–0.5)
Eosinophils Relative: 3 %
HCT: 46.7 % (ref 39.0–52.0)
Hemoglobin: 14.4 g/dL (ref 13.0–17.0)
Immature Granulocytes: 0 %
Lymphocytes Relative: 36 %
Lymphs Abs: 0.8 10*3/uL (ref 0.7–4.0)
MCH: 29.6 pg (ref 26.0–34.0)
MCHC: 30.8 g/dL (ref 30.0–36.0)
MCV: 95.9 fL (ref 80.0–100.0)
Monocytes Absolute: 0.2 10*3/uL (ref 0.1–1.0)
Monocytes Relative: 10 %
Neutro Abs: 1.2 10*3/uL — ABNORMAL LOW (ref 1.7–7.7)
Neutrophils Relative %: 51 %
Platelet Count: 125 10*3/uL — ABNORMAL LOW (ref 150–400)
RBC: 4.87 MIL/uL (ref 4.22–5.81)
RDW: 11.9 % (ref 11.5–15.5)
WBC Count: 2.4 10*3/uL — ABNORMAL LOW (ref 4.0–10.5)
nRBC: 0 % (ref 0.0–0.2)

## 2019-12-26 LAB — CMP (CANCER CENTER ONLY)
ALT: 7 U/L (ref 0–44)
AST: 14 U/L — ABNORMAL LOW (ref 15–41)
Albumin: 3.8 g/dL (ref 3.5–5.0)
Alkaline Phosphatase: 59 U/L (ref 38–126)
Anion gap: 5 (ref 5–15)
BUN: 13 mg/dL (ref 8–23)
CO2: 33 mmol/L — ABNORMAL HIGH (ref 22–32)
Calcium: 9 mg/dL (ref 8.9–10.3)
Chloride: 109 mmol/L (ref 98–111)
Creatinine: 1.17 mg/dL (ref 0.61–1.24)
GFR, Est AFR Am: >60 mL/min (ref >60)
GFR, Estimated: 57 mL/min — ABNORMAL LOW (ref >60)
Glucose, Bld: 75 mg/dL (ref 70–99)
Potassium: 4.1 mmol/L (ref 3.5–5.1)
Sodium: 147 mmol/L — ABNORMAL HIGH (ref 135–145)
Total Bilirubin: 1.2 mg/dL (ref 0.3–1.2)
Total Protein: 6.5 g/dL (ref 6.5–8.1)

## 2019-12-26 MED ORDER — CYANOCOBALAMIN 1000 MCG/ML IJ SOLN
INTRAMUSCULAR | Status: AC
  Filled 2019-12-26: qty 1

## 2019-12-26 MED ORDER — CYANOCOBALAMIN 1000 MCG/ML IJ SOLN
1000.0000 ug | Freq: Once | INTRAMUSCULAR | Status: AC
  Administered 2019-12-26: 11:00:00 1000 ug via INTRAMUSCULAR

## 2019-12-26 NOTE — Patient Instructions (Signed)

## 2019-12-26 NOTE — Addendum Note (Signed)
Addended by: Georgianne Fick on: 12/26/2019 03:38 PM   Modules accepted: Orders

## 2019-12-26 NOTE — Telephone Encounter (Signed)
Scheduled appt per 2/24 los - gave pt AVS and calender per los.

## 2019-12-26 NOTE — Progress Notes (Signed)
[facility] [phone]   [phone]  OFFICE PROGRESS NOTE  Aletha Halim., PA-C [address] [place] [address]  DIAGNOSIS: Bicytopenia likely secondary to vitamin B12 deficiency.  PRIOR THERAPY: None  CURRENT THERAPY: Vitamin B12 injection weekly for the next 4 weeks.  INTERVAL HISTORY: Ronald [name] 86 y.o. male returns to the clinic today for follow-up visit.  The patient is feeling fine today with no concerning complaints except for numbness in his left hand as well as cold extremities.  He denied having any current chest pain, shortness of breath, cough or hemoptysis.  He denied having any fever or chills.  He has no nausea, vomiting, diarrhea or constipation.  He has no headache or visual changes.  He is here today for evaluation and repeat blood work.  MEDICAL HISTORY: Past Medical History:  Diagnosis Date  . Allergic rhinitis, cause unspecified   . BPH (benign prostatic hypertrophy)   . Essential hypertension, benign   . GERD (gastroesophageal reflux disease)   . Glaucoma   . Hepatic cyst   . Hiatal hernia 2014  . History of gout   . Hyperlipidemia   . Irritable bowel syndrome   . Lactose intolerance   . Personal history of colonic polyps 03/04/1992   adenomatous polyp  . Pneumonia    "~ 5 times in my lifetime; last time was 03/2014" (10/14/2017)  . Reflux esophagitis   . Schatzki's ring 2014  . Sleep apnea   . Vitamin B12 deficiency     ALLERGIES:  is allergic to lactose intolerance (gi).  MEDICATIONS:  Current Outpatient Medications  Medication Sig Dispense Refill  . fluticasone (FLONASE) 50 MCG/ACT nasal spray 1 spray each nostril 1-2 times daily    . furosemide (LASIX) 20 MG tablet Take 1 tablet (20 mg total) by mouth daily. 90 tablet 3  . levocetirizine (XYZAL) 5 MG tablet Take 5 mg by mouth at bedtime.    . metoprolol (TOPROL-XL) 200 MG 24 hr tablet TAKE 0.5 TABLETS (100 MG TOTAL) BY MOUTH 2 (TWO)  TIMES DAILY. 90 tablet 1  . montelukast (SINGULAIR) 10 MG tablet Take by mouth daily.    Ronald [name] Glyc-Propyl Glyc PF (SYSTANE HYDRATION PF) 0.4-0.3 % SOLN Apply to eye.    . potassium chloride (KLOR-CON) 10 MEQ tablet Take 10 mEq by mouth once.    . prednisoLONE acetate (PRED FORTE) 1 % ophthalmic suspension INSTILL ONE DROP INTO THE RIGHT EYE 4 TIMES A DAY    . rivaroxaban (XARELTO) 20 MG TABS tablet Take 20 mg by mouth daily with supper.    . sacubitril-valsartan (ENTRESTO) 97-103 MG Take 1 tablet by mouth 2 (two) times daily.    [name] spironolactone (ALDACTONE) 50 MG tablet Take 50 mg by mouth daily.     Current Facility-Administered Medications  Medication Dose Route Frequency Provider Last Rate Last Admin  . cyanocobalamin ((VITAMIN B-12)) injection 1,000 mcg  1,000 mcg Intramuscular Q30 days Sable Feil, MD   1,000 mcg at 12/04/12 4431    SURGICAL HISTORY:  Past Surgical History:  Procedure Laterality Date  . CATARACT EXTRACTION W/ INTRAOCULAR LENS IMPLANT Left   . HYDROCELE EXCISION Bilateral 10/2016   Archie Endo 10/11/2016  . INTRAVASCULAR PRESSURE WIRE/FFR STUDY N/A 10/17/2017   Procedure: INTRAVASCULAR PRESSURE WIRE/FFR STUDY;  Surgeon: Nigel Mormon, MD;  Location: [facility];  Service: Cardiovascular;  Laterality: N/A;  . NASAL RECONSTRUCTION     "tried to  fix my nose so I can smell; still can't smell" (10/14/2017)  . RIGHT/LEFT HEART CATH AND CORONARY ANGIOGRAPHY N/A 10/17/2017   Procedure: RIGHT/LEFT HEART CATH AND CORONARY ANGIOGRAPHY;  Surgeon: Nigel Mormon, MD;  Location: [facility];  Service: Cardiovascular;  Laterality: N/A;  . TRANSURETHRAL RESECTION OF PROSTATE  10/2007   Archie Endo 08/22/2007  . ULTRASOUND GUIDANCE FOR VASCULAR ACCESS  10/17/2017   Procedure: Ultrasound Guidance For Vascular Access;  Surgeon: Nigel Mormon, MD;  Location: [facility];  Service: Cardiovascular;;    REVIEW OF SYSTEMS:  A comprehensive  review of systems was negative except for: Constitutional: positive for fatigue Neurological: positive for paresthesia   PHYSICAL EXAMINATION: General appearance: alert, cooperative, fatigued and no distress Head: Normocephalic, without obvious abnormality, atraumatic Neck: no adenopathy, no JVD, supple, symmetrical, trachea midline and thyroid not enlarged, symmetric, no tenderness/mass/nodules Lymph nodes: Cervical, supraclavicular, and axillary nodes normal. Resp: clear to auscultation bilaterally Back: symmetric, no curvature. ROM normal. No CVA tenderness. Cardio: regular rate and rhythm, S1, S2 normal, no murmur, click, rub or gallop GI: soft, non-tender; bowel sounds normal; no masses,  no organomegaly Extremities: extremities normal, atraumatic, no cyanosis or edema  ECOG PERFORMANCE STATUS: 1 - Symptomatic but completely ambulatory  Blood pressure (!) 132/91, pulse 64, temperature 98 F (36.7 C), temperature source Temporal, resp. rate 18, height _0  (1.803 m), weight 181 lb 4.8 oz (82.2 kg), SpO2 99 %.  LABORATORY DATA: Lab Results  Component Value Date   WBC 2.4 (L) 12/26/2019   HGB 14.4 12/26/2019   HCT 46.7 12/26/2019   MCV 95.9 12/26/2019   PLT 125 (L) 12/26/2019      Chemistry      Component Value Date/Time   NA 145 09/03/2019 1319   K 4.2 09/03/2019 1319   CL 107 09/03/2019 1319   CO2 31 09/03/2019 1319   BUN 16 09/03/2019 1319   CREATININE 1.15 09/03/2019 1319      Component Value Date/Time   CALCIUM 9.0 09/03/2019 1319   ALKPHOS 62 09/03/2019 1319   AST 13 (L) 09/03/2019 1319   ALT 8 09/03/2019 1319   BILITOT 1.1 09/03/2019 1319       RADIOGRAPHIC STUDIES: No results found.  ASSESSMENT AND PLAN: This is a very pleasant 86 years old African-American male with bicytopenia including leukocytopenia and thrombocytopenia.  This is likely secondary to vitamin B12 deficiency. He had a bone marrow biopsy and aspirate that was unremarkable. I  recommended for the patient to start vitamin B12 injection on weekly basis for the next 4 weeks. I will see him back for follow-up visit in 4 weeks for evaluation and repeat blood work. He was advised to call immediately if he has any other concerning symptoms in the interval. The patient voices understanding of current disease status and treatment options and is in agreement with the current care plan.  All questions were answered. The patient knows to call the clinic with any problems, questions or concerns. We can certainly see the patient much sooner if necessary.  Disclaimer: This note was dictated with voice recognition software. Similar sounding words can inadvertently be transcribed and may not be corrected upon review.

## 2019-12-27 ENCOUNTER — Other Ambulatory Visit: Payer: Self-pay

## 2019-12-27 DIAGNOSIS — I119 Hypertensive heart disease without heart failure: Secondary | ICD-10-CM

## 2019-12-27 NOTE — Addendum Note (Signed)
Addended by: Damita Lack on: 12/27/2019 11:19 AM   Modules accepted: Orders

## 2019-12-29 LAB — COMPREHENSIVE METABOLIC PANEL
ALT: 5 IU/L (ref 0–44)
AST: 12 IU/L (ref 0–40)
Albumin/Globulin Ratio: 1.9 (ref 1.2–2.2)
Albumin: 4.1 g/dL (ref 3.6–4.6)
Alkaline Phosphatase: 62 IU/L (ref 39–117)
BUN/Creatinine Ratio: 9 — ABNORMAL LOW (ref 10–24)
BUN: 11 mg/dL (ref 8–27)
Bilirubin Total: 0.9 mg/dL (ref 0.0–1.2)
CO2: 28 mmol/L (ref 20–29)
Calcium: 9 mg/dL (ref 8.6–10.2)
Chloride: 108 mmol/L — ABNORMAL HIGH (ref 96–106)
Creatinine, Ser: 1.2 mg/dL (ref 0.76–1.27)
GFR calc Af Amer: 64 mL/min/{1.73_m2} (ref >59)
GFR calc non Af Amer: 56 mL/min/{1.73_m2} — ABNORMAL LOW (ref >59)
Globulin, Total: 2.2 g/dL (ref 1.5–4.5)
Glucose: 94 mg/dL (ref 65–99)
Potassium: 4.5 mmol/L (ref 3.5–5.2)
Sodium: 147 mmol/L — ABNORMAL HIGH (ref 134–144)
Total Protein: 6.3 g/dL (ref 6.0–8.5)

## 2019-12-31 ENCOUNTER — Other Ambulatory Visit: Payer: Self-pay

## 2019-12-31 ENCOUNTER — Ambulatory Visit: Payer: Medicare HMO | Admitting: Cardiology

## 2019-12-31 ENCOUNTER — Other Ambulatory Visit: Payer: Self-pay | Admitting: Cardiology

## 2019-12-31 ENCOUNTER — Encounter: Payer: Self-pay | Admitting: Cardiology

## 2019-12-31 VITALS — BP 138/78 | HR 66 | Temp 97.0°F | Ht 71.0 in | Wt 184.0 lb

## 2019-12-31 DIAGNOSIS — Z7901 Long term (current) use of anticoagulants: Secondary | ICD-10-CM

## 2019-12-31 DIAGNOSIS — Z87891 Personal history of nicotine dependence: Secondary | ICD-10-CM

## 2019-12-31 DIAGNOSIS — I428 Other cardiomyopathies: Secondary | ICD-10-CM

## 2019-12-31 DIAGNOSIS — I2510 Atherosclerotic heart disease of native coronary artery without angina pectoris: Secondary | ICD-10-CM

## 2019-12-31 DIAGNOSIS — I119 Hypertensive heart disease without heart failure: Secondary | ICD-10-CM

## 2019-12-31 DIAGNOSIS — I5022 Chronic systolic (congestive) heart failure: Secondary | ICD-10-CM

## 2019-12-31 DIAGNOSIS — I4821 Permanent atrial fibrillation: Secondary | ICD-10-CM

## 2019-12-31 DIAGNOSIS — I110 Hypertensive heart disease with heart failure: Secondary | ICD-10-CM

## 2019-12-31 MED ORDER — RIVAROXABAN 20 MG PO TABS: 20.0000 mg | ORAL_TABLET | Freq: Every day | ORAL | 3 refills | Status: DC

## 2019-12-31 NOTE — Progress Notes (Signed)
Subjective:  Primary Physician:  Aletha Halim., PA-C  Patient ID: Ronald [name], male    DOB: 1934-01-14, 86 y.o.   MRN: [id number]   Chief Complaint  Patient presents with  . Congestive Heart Failure  . Hypertension  . Follow-up  . Results    labs   HPI: Ronald [name]  is a 86 y.o. male.  whose a past medical history and cardiac risk factors include but not limited to: dilated nonischemic cardiomyopathy, heart failure with reduced EF, permanent atrial fibrillation, nonobstructive coronary artery disease, hypertension, former smoker, advanced age.  Patient was formally seen and evaluated by Dr. Woody Seller; however upon his retirement patient has established care with myself.  I am seeing him for the first time.  Heart failure with reduced EF: Patient has no history of heart failure with reduced EF secondary to nonobstructive coronary artery disease and what appears to be permanent atrial fibrillation.  Since last office visit patient has not had any hospitalizations for heart failure exacerbations. Patient is tolerating his heart failure medications without any side effects or intolerances.  Patient states that his weight has been pretty much stable and does not complain of orthopnea, paroxysmal nocturnal dyspnea or lower extremity swelling.  His last echocardiogram and left heart catheterization reports reviewed and noted below for further reference.  According to Dr. Marcial Pacas last note he was referred to EP specialist Dr. Jolyn Nap in Dec. 2018 for possible ICD implant. Dr. Caryl Comes felt that in view of patient's age and atrial fibrillation, benefits of ICD may not outweigh the risks and he did not recommend ICD implant.  Permanent atrial fibrillation: Patient states that he has had atrial fibrillation for several years.  He is not undergone cardioversion according to his memory.  And according to last cardiology note he was to be considered to have permanent atrial fibrillation as per his  discussion with his prior cardiologist and himself.  He is currently managed by rate control and thromboembolic prophylaxis.  Patient is currently on Xarelto and does not endorse any evidence of bleeding.  Patient's been taking Xarelto in the morning; however, he has been instructed to take it with supper.  Past Medical History:  Diagnosis Date  . Allergic rhinitis, cause unspecified   . Atrial fibrillation ([place])   . BPH (benign prostatic hypertrophy)   . CHF (congestive heart failure) ([place])   . Essential hypertension, benign   . GERD (gastroesophageal reflux disease)   . Glaucoma   . Hepatic cyst   . Hiatal hernia 2014  . History of gout   . Hyperlipidemia   . Irritable bowel syndrome   . Lactose intolerance   . Personal history of colonic polyps 03/04/1992   adenomatous polyp  . Pneumonia    "~ 5 times in my lifetime; last time was 03/2014" (10/14/2017)  . Reflux esophagitis   . Schatzki's ring 2014  . Sleep apnea   . Vitamin B12 deficiency     Past Surgical History:  Procedure Laterality Date  . CATARACT EXTRACTION W/ INTRAOCULAR LENS IMPLANT Left   . HYDROCELE EXCISION Bilateral 10/2016   Archie Endo 10/11/2016  . INTRAVASCULAR PRESSURE WIRE/FFR STUDY N/A 10/17/2017   Procedure: INTRAVASCULAR PRESSURE WIRE/FFR STUDY;  Surgeon: Nigel Mormon, MD;  Location: [facility];  Service: Cardiovascular;  Laterality: N/A;  . NASAL RECONSTRUCTION     "tried to fix my nose so I can smell; still can't smell" (10/14/2017)  . RIGHT/LEFT HEART CATH AND CORONARY ANGIOGRAPHY  N/A 10/17/2017   Procedure: RIGHT/LEFT HEART CATH AND CORONARY ANGIOGRAPHY;  Surgeon: Nigel Mormon, MD;  Location: [facility];  Service: Cardiovascular;  Laterality: N/A;  . TRANSURETHRAL RESECTION OF PROSTATE  10/2007   Archie Endo 08/22/2007  . ULTRASOUND GUIDANCE FOR VASCULAR ACCESS  10/17/2017   Procedure: Ultrasound Guidance For Vascular Access;  Surgeon: Nigel Mormon, MD;  Location: [facility];  Service: Cardiovascular;;    Social History   Socioeconomic History  . Marital status: Married    Spouse name: Not on file  . Number of children: 2  . Years of education: Not on file  . Highest education level: Not on file  Occupational History  . Not on file  Tobacco Use  . Smoking status: Former Smoker    Packs/day: 1.00    Years: 30.00    Pack years: 30.00    Types: Cigarettes    Quit date: 11/02/1983    Years since quitting: 36.1  . Smokeless tobacco: Never Used  Substance and Sexual Activity  . Alcohol use: No  . Drug use: No  . Sexual activity: Not on file  Other Topics Concern  . Not on file  Social History Narrative  . Not on file   Social Determinants of Health   Financial Resource Strain:   . Difficulty of Paying Living Expenses: Not on file  Food Insecurity:   . Worried About Charity fundraiser in the Last Year: Not on file  . Ran Out of Food in the Last Year: Not on file  Transportation Needs:   . Lack of Transportation (Medical): Not on file  . Lack of Transportation (Non-Medical): Not on file  Physical Activity:   . Days of Exercise per Week: Not on file  . Minutes of Exercise per Session: Not on file  Stress:   . Feeling of Stress : Not on file  Social Connections:   . Frequency of Communication with Friends and Family: Not on file  . Frequency of Social Gatherings with Friends and Family: Not on file  . Attends Religious Services: Not on file  . Active Member of Clubs or Organizations: Not on file  . Attends Archivist Meetings: Not on file  . Marital Status: Not on file  Intimate Partner Violence:   . Fear of Current or Ex-Partner: Not on file  . Emotionally Abused: Not on file  . Physically Abused: Not on file  . Sexually Abused: Not on file    Current Outpatient Medications on File Prior to Visit  Medication Sig Dispense Refill  . azelastine (ASTELIN) 0.1 % nasal spray Place into the nose as needed.    .  cetirizine (ZYRTEC) 10 MG tablet TAKE 1 TABLET BY MOUTH EVERY DAY    . fluticasone (FLONASE) 50 MCG/ACT nasal spray 1 spray each nostril 1-2 times daily    . metoprolol succinate (TOPROL-XL) 200 MG 24 hr tablet Take 1 tablet (200 mg total) by mouth daily. Take with or immediately following a meal. (Patient taking differently: Take 100 mg by mouth 2 (two) times daily. ) 60 tablet 3  . montelukast (SINGULAIR) 10 MG tablet Take by mouth daily.    . prednisoLONE acetate (PRED FORTE) 1 % ophthalmic suspension INSTILL ONE DROP INTO THE RIGHT EYE 4 TIMES A DAY    . PROLENSA 0.07 % SOLN INSTILL 1 DROP IN THE RIGHT EYE AT BEDTIME    . rivaroxaban (XARELTO) 20 MG TABS tablet Take 20 mg  by mouth daily with supper.    . sacubitril-valsartan (ENTRESTO) 97-103 MG Take 1 tablet by mouth 2 (two) times daily.    [name] spironolactone (ALDACTONE) 50 MG tablet Take 50 mg by mouth daily.     Current Facility-Administered Medications on File Prior to Visit        Last Dose         1,000 mcg at 12/04/12 [id number]   Review of Systems  Constitutional: Negative for fever.  HENT: Negative for nosebleeds.   Eyes: Negative for blurred vision.  Respiratory: Negative for cough. Shortness of breath: on exertion.        Shortness of breath chronic and stable.  Cardiovascular: Negative for leg swelling.  Gastrointestinal: Negative for abdominal pain, nausea and vomiting.  Genitourinary: Negative for dysuria.  Musculoskeletal: Negative for myalgias.  Skin: Negative for itching and rash.  Neurological: Negative for dizziness, seizures and loss of consciousness.  Psychiatric/Behavioral: The patient is not nervous/anxious.       Objective:  Blood pressure 138/78, pulse 66, temperature (!) 97 F (36.1 C), height 5\' 11"  (1.803 m), weight 184 lb (83.5 kg), SpO2 97 %. Body mass index is 25.66 kg/m.  Physical Exam  Constitutional: He is oriented to person, place, and time. He appears well-developed and well-nourished.  HENT:   Head: Normocephalic and atraumatic.  Eyes: Conjunctivae are normal.  Neck: No JVD (4 cm above clavicle.) present.  Cardiovascular: Normal rate. Exam reveals no gallop.  Murmur (3/6 MSM at apex, 2/6 ESM in aortic) heard. Pulses:      Carotid pulses are 2+ on the right side and 2+ on the left side.      Femoral pulses are 2+ on the right side and 2+ on the left side.      Dorsalis pedis pulses are 2+ on the right side and 2+ on the left side.       Posterior tibial pulses are 2+ on the right side and 2+ on the left side.  Irregular rhythm. Heart sounds variable intensity.  Pulmonary/Chest: He has no wheezes. He has no rales.  Abdominal: He exhibits no distension. There is no abdominal tenderness. There is no rebound and no guarding.  Musculoskeletal:        General: No edema.     Comments: Varicose veins and spider veins seen in the legs.  Lymphadenopathy:    He has no cervical adenopathy.  Neurological: He is alert and oriented to person, place, and time.  Skin: Skin is warm.    CARDIAC STUDIES:  RHC/LHC 10/17/2017: Nonobstructive coronary artery disease- 40% stenosis mid LAD, 60% second diagonal, 50% stenosis of first obtuse marginal. Nonischemic cardiomyopathy WHO Grp II pulmonary hypertension mPAP 30 mmhg  Echocardiogram 11/07/2018: Left ventricle cavity is normal in size. Mild concentric hypertrophy of the left ventricle. Visual EF is 35-40%. Systolic septal flattening due to right ventricular pressure overload. Calculated EF 41%. Unable to evaluate diastolic function due to A. Fibrillation. Left atrial cavity is moderately dilated. Right atrial cavity is severely dilated. Right ventricle cavity is mildly dilated. Low normal right ventricular function. Mild (Grade I) mitral regurgitation. Moderate to severe tricuspid regurgitation. Estimated pulmonary artery systolic pressure 31 mmHg. Compared to previous study on 10/04/2017, LVEF is significantly improved. Mitral regurgitation,  pulmonary hypertension, and RV dilatation severity decreased.  Lexiscan myoview stress test 08/08/2017: 1. Low risk of hemodynamically significant coronary artery disease. Prognostically, this is a high risk study. The LV is dilated both in rest and stress images with  severe global hypokinesis. 2. Rest and stress EKG show A. FIb and occasional PVC. Non diagnostic due to pharmacologic stress. 3. Fixed perfusion defect due to diaphragmatic attenuation. 4. The gated study showed the ejection fraction was <10%.  CBC Latest Ref Rng & Units 12/26/2019 09/18/2019 09/03/2019  WBC 4.0 - 10.5 K/uL 2.4(L) 2.2(L) 2.6(L)  Hemoglobin 13.0 - 17.0 g/dL 14.4 13.7 14.4  Hematocrit 39.0 - 52.0 % 46.7 42.8 45.9  Platelets 150 - 400 K/uL 125(L) 113(L) 124(L)   CMP Latest Ref Rng & Units 12/28/2019 12/26/2019 09/03/2019  Glucose 65 - 99 mg/dL 94 75 87  BUN 8 - 27 mg/dL 11 13 16   Creatinine 0.76 - 1.27 mg/dL 1.20 1.17 1.15  Sodium 134 - 144 mmol/L 147(H) 147(H) 145  Potassium 3.5 - 5.2 mmol/L 4.5 4.1 4.2  Chloride 96 - 106 mmol/L 108(H) 109 107  CO2 20 - 29 mmol/L 28 33(H) 31  Calcium 8.6 - 10.2 mg/dL 9.0 9.0 9.0  Total Protein 6.0 - 8.5 g/dL 6.3 6.5 6.6  Total Bilirubin 0.0 - 1.2 mg/dL 0.9 1.2 1.1  Alkaline Phos 39 - 117 IU/L 62 59 62  AST 0 - 40 IU/L 12 14(L) 13(L)  ALT 0 - 44 IU/L 5 7 8    Lipid Panel     Component Value Date/Time   CHOL 86 03/25/2014 0235   TRIG 60 03/25/2014 0235   HDL 35 (L) 03/25/2014 0235   CHOLHDL 2.5 03/25/2014 0235   VLDL 12 03/25/2014 0235   LDLCALC 39 03/25/2014 0235    Assessment & Recommendations:     ICD-10-CM   1. Chronic HFrEF (heart failure with reduced ejection fraction) (HCC)  I50.22 Lipid Panel With LDL/HDL Ratio    Basic Metabolic Panel (BMET)    Magnesium    Pro b natriuretic peptide (BNP)[facility])  2. Hypertensive heart disease with heart failure (HCC)  I11.0 EKG 12-Lead  3. Hypertension with heart disease  I11.9   4. Permanent  atrial fibrillation (HCC)  I48.21 rivaroxaban (XARELTO) 20 MG TABS tablet  5. Nonischemic cardiomyopathy (HCC)  99991111 Basic Metabolic Panel (BMET)    Magnesium    Pro b natriuretic peptide (BNP)9[facility])  6. Nonobstructive atherosclerosis of coronary artery  I25.10   7. Former smoker  Z87.891   25. Long term (current) use of anticoagulants  Z79.01 rivaroxaban (XARELTO) 20 MG TABS tablet   Recommendation:  Ronald [name]  is a 86 y.o. male.  whose a past medical history and cardiac risk factors include but not limited to: dilated nonischemic cardiomyopathy, heart failure with reduced EF, permanent atrial fibrillation, nonobstructive coronary artery disease, hypertension, former smoker, advanced age.  Chronic heart failure with reduced EF, stage C, NYHA class II:  Currently on guideline directed medical therapy: Beta-blocker therapy, Entresto, Aldactone, Lasix and as needed basis.  Ejection Fraction noted on last 2D Echo  Recommend daily weight check, strict I/O's  Fluid restriction to <2L per day, Na restriction < 2g per day  Patient has been evaluated by electrophysiology in the past recommendations as noted above.  Patient also has a prescription for Lasix which he uses on as needed basis if he notices weight gain.  Check lipid profile.  Prior to the next office visit we will check a BMP, magnesium, NT proBNP.  Hypertension with heart failure with reduced EF:  Continue current medical therapy.  Reviewed most recent blood work.  Permanent atrial fibrillation:  CHA2DS2-VASc SCORE is 4 which correlates to 4 %  risk of stroke per year.  Rate control: Beta-blocker therapy.  Thromboembolic prophylaxis: Xarelto.  Patient is requested to take Xarelto with supper.  Long-term oral anticoagulation:  Indication: Atrial fibrillation.  Patient does not endorse any evidence of bleeding.  Xarelto refilled.  Risks, alternatives, and benefits discussed with  the patient in regards oral anticoagulation therapy.  Patient verbalizes understanding that he will seek medical attention to closest ER if he notices bleeding or sustains injury despite mechanism of action.  Janzen Sacks McDonald, DO, [facility] 12/31/2019, 11:11 AM [facility]. [place] Office: [phone]

## 2019-12-31 NOTE — Patient Instructions (Addendum)
Please remember to bring in your medication bottles in at the next visit.   New Medications that were added at today's visit:  None Xarelto (refilled)  Medications that were discontinued at today's visit: None  Office will call you to have the following tests scheduled:  None  Please get labs done in about 1 week and prior to the next office visit at the nearest [place] follow up with your PCP as scheduled.

## 2020-01-01 ENCOUNTER — Other Ambulatory Visit: Payer: Self-pay | Admitting: *Deleted

## 2020-01-01 DIAGNOSIS — R209 Unspecified disturbances of skin sensation: Secondary | ICD-10-CM

## 2020-01-01 DIAGNOSIS — R6889 Other general symptoms and signs: Secondary | ICD-10-CM

## 2020-01-02 ENCOUNTER — Other Ambulatory Visit: Payer: Self-pay

## 2020-01-02 ENCOUNTER — Inpatient Hospital Stay: Payer: Medicare HMO | Attending: Physician Assistant

## 2020-01-02 VITALS — BP 132/72 | HR 62 | Temp 98.2°F | Resp 18

## 2020-01-02 DIAGNOSIS — K589 Irritable bowel syndrome without diarrhea: Secondary | ICD-10-CM | POA: Diagnosis not present

## 2020-01-02 DIAGNOSIS — N400 Enlarged prostate without lower urinary tract symptoms: Secondary | ICD-10-CM | POA: Diagnosis not present

## 2020-01-02 DIAGNOSIS — I509 Heart failure, unspecified: Secondary | ICD-10-CM | POA: Insufficient documentation

## 2020-01-02 DIAGNOSIS — D72819 Decreased white blood cell count, unspecified: Secondary | ICD-10-CM | POA: Insufficient documentation

## 2020-01-02 DIAGNOSIS — Z79899 Other long term (current) drug therapy: Secondary | ICD-10-CM | POA: Diagnosis not present

## 2020-01-02 DIAGNOSIS — D709 Neutropenia, unspecified: Secondary | ICD-10-CM

## 2020-01-02 DIAGNOSIS — I110 Hypertensive heart disease with heart failure: Secondary | ICD-10-CM | POA: Insufficient documentation

## 2020-01-02 DIAGNOSIS — G4730 Sleep apnea, unspecified: Secondary | ICD-10-CM | POA: Insufficient documentation

## 2020-01-02 DIAGNOSIS — D696 Thrombocytopenia, unspecified: Secondary | ICD-10-CM | POA: Insufficient documentation

## 2020-01-02 DIAGNOSIS — E538 Deficiency of other specified B group vitamins: Secondary | ICD-10-CM | POA: Diagnosis present

## 2020-01-02 DIAGNOSIS — E785 Hyperlipidemia, unspecified: Secondary | ICD-10-CM | POA: Insufficient documentation

## 2020-01-02 DIAGNOSIS — Z7901 Long term (current) use of anticoagulants: Secondary | ICD-10-CM | POA: Diagnosis not present

## 2020-01-02 DIAGNOSIS — I4891 Unspecified atrial fibrillation: Secondary | ICD-10-CM | POA: Diagnosis not present

## 2020-01-02 MED ORDER — CYANOCOBALAMIN 1000 MCG/ML IJ SOLN
1000.0000 ug | Freq: Once | INTRAMUSCULAR | Status: AC
  Administered 2020-01-02: 1000 ug via INTRAMUSCULAR

## 2020-01-02 NOTE — Patient Instructions (Signed)

## 2020-01-04 ENCOUNTER — Encounter (HOSPITAL_COMMUNITY): Payer: Medicare HMO

## 2020-01-04 ENCOUNTER — Ambulatory Visit: Payer: Medicare HMO | Admitting: Neurology

## 2020-01-04 ENCOUNTER — Telehealth: Payer: Self-pay | Admitting: Neurology

## 2020-01-04 ENCOUNTER — Encounter: Payer: Medicare HMO | Admitting: Vascular Surgery

## 2020-01-04 NOTE — Telephone Encounter (Signed)
This patient did not show for a new patient appointment today. 

## 2020-01-07 ENCOUNTER — Encounter: Payer: Self-pay | Admitting: Neurology

## 2020-01-09 ENCOUNTER — Other Ambulatory Visit: Payer: Self-pay

## 2020-01-09 ENCOUNTER — Inpatient Hospital Stay: Payer: Medicare HMO

## 2020-01-09 VITALS — BP 138/88 | HR 70 | Temp 97.8°F | Resp 18

## 2020-01-09 DIAGNOSIS — E538 Deficiency of other specified B group vitamins: Secondary | ICD-10-CM | POA: Diagnosis not present

## 2020-01-09 DIAGNOSIS — D709 Neutropenia, unspecified: Secondary | ICD-10-CM

## 2020-01-09 MED ORDER — CYANOCOBALAMIN 1000 MCG/ML IJ SOLN
INTRAMUSCULAR | Status: AC
  Filled 2020-01-09: qty 1

## 2020-01-09 MED ORDER — CYANOCOBALAMIN 1000 MCG/ML IJ SOLN
1000.0000 ug | Freq: Once | INTRAMUSCULAR | Status: AC
  Administered 2020-01-09: 1000 ug via INTRAMUSCULAR

## 2020-01-16 ENCOUNTER — Inpatient Hospital Stay: Payer: Medicare HMO

## 2020-01-16 ENCOUNTER — Other Ambulatory Visit: Payer: Self-pay

## 2020-01-16 VITALS — BP 132/78 | HR 66 | Temp 98.1°F | Resp 18

## 2020-01-16 DIAGNOSIS — E538 Deficiency of other specified B group vitamins: Secondary | ICD-10-CM | POA: Diagnosis not present

## 2020-01-16 DIAGNOSIS — D709 Neutropenia, unspecified: Secondary | ICD-10-CM

## 2020-01-16 MED ORDER — CYANOCOBALAMIN 1000 MCG/ML IJ SOLN
1000.0000 ug | Freq: Once | INTRAMUSCULAR | Status: AC
  Administered 2020-01-16: 1000 ug via INTRAMUSCULAR

## 2020-01-16 MED ORDER — CYANOCOBALAMIN 1000 MCG/ML IJ SOLN
INTRAMUSCULAR | Status: AC
  Filled 2020-01-16: qty 1

## 2020-01-23 ENCOUNTER — Encounter: Payer: Self-pay | Admitting: Internal Medicine

## 2020-01-23 ENCOUNTER — Inpatient Hospital Stay: Payer: Medicare HMO

## 2020-01-23 ENCOUNTER — Other Ambulatory Visit: Payer: Self-pay

## 2020-01-23 ENCOUNTER — Inpatient Hospital Stay (HOSPITAL_BASED_OUTPATIENT_CLINIC_OR_DEPARTMENT_OTHER): Payer: Medicare HMO | Admitting: Internal Medicine

## 2020-01-23 VITALS — BP 152/91 | HR 100 | Temp 98.2°F | Resp 20 | Ht 71.0 in | Wt 183.7 lb

## 2020-01-23 DIAGNOSIS — I10 Essential (primary) hypertension: Secondary | ICD-10-CM | POA: Diagnosis not present

## 2020-01-23 DIAGNOSIS — E538 Deficiency of other specified B group vitamins: Secondary | ICD-10-CM | POA: Diagnosis not present

## 2020-01-23 DIAGNOSIS — D709 Neutropenia, unspecified: Secondary | ICD-10-CM

## 2020-01-23 LAB — CBC WITH DIFFERENTIAL (CANCER CENTER ONLY)
Abs Immature Granulocytes: 0.01 10*3/uL (ref 0.00–0.07)
Basophils Absolute: 0 10*3/uL (ref 0.0–0.1)
Basophils Relative: 0 %
Eosinophils Absolute: 0.1 10*3/uL (ref 0.0–0.5)
Eosinophils Relative: 3 %
HCT: 45.3 % (ref 39.0–52.0)
Hemoglobin: 14.2 g/dL (ref 13.0–17.0)
Immature Granulocytes: 0 %
Lymphocytes Relative: 34 %
Lymphs Abs: 0.8 10*3/uL (ref 0.7–4.0)
MCH: 30.2 pg (ref 26.0–34.0)
MCHC: 31.3 g/dL (ref 30.0–36.0)
MCV: 96.4 fL (ref 80.0–100.0)
Monocytes Absolute: 0.3 10*3/uL (ref 0.1–1.0)
Monocytes Relative: 10 %
Neutro Abs: 1.3 10*3/uL — ABNORMAL LOW (ref 1.7–7.7)
Neutrophils Relative %: 53 %
Platelet Count: 119 10*3/uL — ABNORMAL LOW (ref 150–400)
RBC: 4.7 MIL/uL (ref 4.22–5.81)
RDW: 12 % (ref 11.5–15.5)
WBC Count: 2.4 10*3/uL — ABNORMAL LOW (ref 4.0–10.5)
nRBC: 0 % (ref 0.0–0.2)

## 2020-01-23 LAB — VITAMIN B12: Vitamin B-12: 1017 pg/mL — ABNORMAL HIGH (ref 180–914)

## 2020-01-23 LAB — FOLATE: Folate: 12.6 ng/mL (ref >5.9)

## 2020-01-23 MED ORDER — CYANOCOBALAMIN 1000 MCG/ML IJ SOLN
1000.0000 ug | Freq: Once | INTRAMUSCULAR | Status: AC
  Administered 2020-01-23: 11:00:00 1000 ug via INTRAMUSCULAR

## 2020-01-23 NOTE — Patient Instructions (Signed)

## 2020-01-23 NOTE — Progress Notes (Signed)
[facility] [phone]   [phone]  OFFICE PROGRESS NOTE  Aletha Halim., PA-C [address] 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  PRIOR THERAPY: None  CURRENT THERAPY: Vitamin B12 injection monthly.  INTERVAL HISTORY: Ronald [name] 86 y.o. male returns to the clinic today for follow-up visit.  The patient is feeling fine today with no concerning complaints.  He denied having any chest pain, shortness of breath, cough or hemoptysis.  He was seen at the [facility] for bradycardia and he is expected to have pacemaker placement soon.  He denied having any nausea, vomiting, diarrhea or constipation.  He denied having any headache or visual changes.  He has been tolerating his vitamin B12 injection fairly well.  Is here today for evaluation and repeat blood work.  MEDICAL HISTORY: Past Medical History:  Diagnosis Date  . Allergic rhinitis, cause unspecified   . Atrial fibrillation ([place])   . BPH (benign prostatic hypertrophy)   . CHF (congestive heart failure) ([place])   . Essential hypertension, benign   . GERD (gastroesophageal reflux disease)   . Glaucoma   . Hepatic cyst   . Hiatal hernia 2014  . History of gout   . Hyperlipidemia   . Irritable bowel syndrome   . Lactose intolerance   . Personal history of colonic polyps 03/04/1992   adenomatous polyp  . Pneumonia    "~ 5 times in my lifetime; last time was 03/2014" (10/14/2017)  . Reflux esophagitis   . Schatzki's ring 2014  . Sleep apnea   . Vitamin B12 deficiency     ALLERGIES:  is allergic to lactose intolerance (gi).  MEDICATIONS:  Current Outpatient Medications  Medication Sig Dispense Refill  . dorzolamide (TRUSOPT) 2 % ophthalmic solution 1 drop 2 (two) times daily.    . dorzolamide-timolol (COSOPT) 22.3-6.8 MG/ML ophthalmic solution 1 drop 2 (two) times daily.    . fluticasone (FLONASE) 50 MCG/ACT nasal spray 1 spray  each nostril 1-2 times daily    . furosemide (LASIX) 20 MG tablet Take 20 mg by mouth as needed for fluid or edema (If he gains more than 2 pounds in 24hr or more than 5 pounds in a week.).     [name] GOODSENSE ALL DAY ALLERGY 10 MG tablet Take 10 mg by mouth daily.    [name] latanoprost (XALATAN) 0.005 % ophthalmic solution 1 drop at bedtime.    [name] levobunolol (BETAGAN) 0.5 % ophthalmic solution 1 drop every morning.    [name] levocetirizine (XYZAL) 5 MG tablet Take 5 mg by mouth at bedtime.    . metoprolol (TOPROL-XL) 200 MG 24 hr tablet TAKE 0.5 TABLETS (100 MG TOTAL) BY MOUTH 2 (TWO) TIMES DAILY. 90 tablet 1  . montelukast (SINGULAIR) 10 MG tablet Take by mouth daily.    Vladimir Faster Glyc-Propyl Glyc PF (SYSTANE HYDRATION PF) 0.4-0.3 % SOLN Apply to eye.    . potassium chloride (KLOR-CON) 10 MEQ tablet Take 10 mEq by mouth once.    . prednisoLONE acetate (PRED FORTE) 1 % ophthalmic suspension INSTILL ONE DROP INTO THE RIGHT EYE 4 TIMES A DAY    . rivaroxaban (XARELTO) 20 MG TABS tablet Take 1 tablet (20 mg total) by mouth daily with supper. 90 tablet 3  . sacubitril-valsartan (ENTRESTO) 97-103 MG Take 1 tablet by mouth 2 (two) times daily.    [name] spironolactone (ALDACTONE) 50 MG tablet Take 50 mg by mouth daily.    [name]  Travoprost, BAK Free, (TRAVATAN) 0.004 % SOLN ophthalmic solution Place 1 drop into both eyes nightly.     Current Facility-Administered Medications  Medication Dose Route Frequency Provider Last Rate Last Admin  . cyanocobalamin ((VITAMIN B-12)) injection 1,000 mcg  1,000 mcg Intramuscular Q30 days Sable Feil, MD   1,000 mcg at 12/04/12 8937    SURGICAL HISTORY:  Past Surgical History:  Procedure Laterality Date  . CATARACT EXTRACTION W/ INTRAOCULAR LENS IMPLANT Left   . HYDROCELE EXCISION Bilateral 10/2016   Archie Endo 10/11/2016  . INTRAVASCULAR PRESSURE WIRE/FFR STUDY N/A 10/17/2017   Procedure: INTRAVASCULAR PRESSURE WIRE/FFR STUDY;  Surgeon: Nigel Mormon, MD;  Location: [facility];  Service: Cardiovascular;  Laterality: N/A;  . NASAL RECONSTRUCTION     "tried to fix my nose so I can smell; still can't smell" (10/14/2017)  . RIGHT/LEFT HEART CATH AND CORONARY ANGIOGRAPHY N/A 10/17/2017   Procedure: RIGHT/LEFT HEART CATH AND CORONARY ANGIOGRAPHY;  Surgeon: Nigel Mormon, MD;  Location: [facility];  Service: Cardiovascular;  Laterality: N/A;  . TRANSURETHRAL RESECTION OF PROSTATE  10/2007   Archie Endo 08/22/2007  . ULTRASOUND GUIDANCE FOR VASCULAR ACCESS  10/17/2017   Procedure: Ultrasound Guidance For Vascular Access;  Surgeon: Nigel Mormon, MD;  Location: [facility];  Service: Cardiovascular;;    REVIEW OF SYSTEMS:  A comprehensive review of systems was negative except for: Constitutional: positive for fatigue   PHYSICAL EXAMINATION: General appearance: alert, cooperative, fatigued and no distress Head: Normocephalic, without obvious abnormality, atraumatic Neck: no adenopathy, no JVD, supple, symmetrical, trachea midline and thyroid not enlarged, symmetric, no tenderness/mass/nodules Lymph nodes: Cervical, supraclavicular, and axillary nodes normal. Resp: clear to auscultation bilaterally Back: symmetric, no curvature. ROM normal. No CVA tenderness. Cardio: regular rate and rhythm, S1, S2 normal, no murmur, click, rub or gallop GI: soft, non-tender; bowel sounds normal; no masses,  no organomegaly Extremities: extremities normal, atraumatic, no cyanosis or edema  ECOG PERFORMANCE STATUS: 1 - Symptomatic but completely ambulatory  Blood pressure (!) 152/91, pulse 100, temperature 98.2 F (36.8 C), temperature source Oral, resp. rate 20, height '5\' 11"'  (1.803 m), weight 183 lb 11.2 oz (83.3 kg), SpO2 99 %.  LABORATORY DATA: Lab Results  Component Value Date   WBC 2.4 (L) 01/23/2020   HGB 14.2 01/23/2020   HCT 45.3 01/23/2020   MCV 96.4 01/23/2020   PLT 119 (L) 01/23/2020      Chemistry      Component Value  Date/Time   NA 147 (H) 12/28/2019 0943   K 4.5 12/28/2019 0943   CL 108 (H) 12/28/2019 0943   CO2 28 12/28/2019 0943   BUN 11 12/28/2019 0943   CREATININE 1.20 12/28/2019 0943   CREATININE 1.17 12/26/2019 1026      Component Value Date/Time   CALCIUM 9.0 12/28/2019 0943   ALKPHOS 62 12/28/2019 0943   AST 12 12/28/2019 0943   AST 14 (L) 12/26/2019 1026   ALT 5 12/28/2019 0943   ALT 7 12/26/2019 1026   BILITOT 0.9 12/28/2019 0943   BILITOT 1.2 12/26/2019 1026       RADIOGRAPHIC STUDIES: No results found.  ASSESSMENT AND PLAN: This is a very pleasant 86 years old African-American male with bicytopenia including leukocytopenia and thrombocytopenia.  This is likely secondary to vitamin B12 deficiency. He had a bone marrow biopsy and aspirate that was unremarkable. The patient started treatment with B12 injection on weekly basis for the last 4 weeks.  He tolerated  the injection well with no concerning complaints. He had repeat CBC today that showed persistent leukocytopenia and thrombocytopenia. I recommended for the patient to continue his B12 injection but on monthly basis. I will see him back for follow-up visit in 3 months for evaluation and repeat blood work. He was advised to call if he has any concerning symptoms in the interval. The patient voices understanding of current disease status and treatment options and is in agreement with the current care plan.  All questions were answered. The patient knows to call the clinic with any problems, questions or concerns. We can certainly see the patient much sooner if necessary.  Disclaimer: This note was dictated with voice recognition software. Similar sounding words can inadvertently be transcribed and may not be corrected upon review.

## 2020-01-25 ENCOUNTER — Telehealth: Payer: Self-pay | Admitting: Internal Medicine

## 2020-01-25 NOTE — Telephone Encounter (Signed)
Scheduled per los. Called, not able to leave msg. Mailed printout  

## 2020-01-30 ENCOUNTER — Ambulatory Visit: Payer: Medicare HMO

## 2020-02-07 ENCOUNTER — Other Ambulatory Visit: Payer: Self-pay | Admitting: *Deleted

## 2020-02-07 DIAGNOSIS — R209 Unspecified disturbances of skin sensation: Secondary | ICD-10-CM

## 2020-02-07 DIAGNOSIS — R6889 Other general symptoms and signs: Secondary | ICD-10-CM

## 2020-02-14 ENCOUNTER — Ambulatory Visit (INDEPENDENT_AMBULATORY_CARE_PROVIDER_SITE_OTHER): Payer: Medicare HMO | Admitting: Vascular Surgery

## 2020-02-14 ENCOUNTER — Other Ambulatory Visit: Payer: Self-pay

## 2020-02-14 ENCOUNTER — Ambulatory Visit (HOSPITAL_COMMUNITY)
Admission: RE | Admit: 2020-02-14 | Discharge: 2020-02-14 | Disposition: A | Discharge status: Home / Self Care | Payer: Medicare HMO | Source: Ambulatory Visit | Attending: Surgery | Admitting: Surgery

## 2020-02-14 ENCOUNTER — Encounter: Payer: Self-pay | Admitting: Vascular Surgery

## 2020-02-14 VITALS — BP 158/78 | HR 63 | Temp 97.6°F | Resp 20 | Ht 71.0 in | Wt 183.0 lb

## 2020-02-14 DIAGNOSIS — R6889 Other general symptoms and signs: Secondary | ICD-10-CM | POA: Insufficient documentation

## 2020-02-14 DIAGNOSIS — R208 Other disturbances of skin sensation: Secondary | ICD-10-CM

## 2020-02-14 DIAGNOSIS — R209 Unspecified disturbances of skin sensation: Secondary | ICD-10-CM

## 2020-02-14 NOTE — Progress Notes (Signed)
Referring Physician: Clyde Lundborg PA  Patient name: Ronald [name] MRN: [id number] DOB: 09-26-1934 Sex: male  REASON FOR CONSULT: Cool sensation right foot left hand  HPI: Ronald [name] is a 86 y.o. male, with a several month history of a cool sensation that occurs in his right leg primarily at nighttime.  He does not really have any trouble during the day.  He does not have claudication.  He also complains of some coolness in his left hand and intermittent numbness and tingling to digits 1 and 2 in the left hand.  This is intermittent as well.  He denies any history of vibration trauma to the left hand.  He does have a history of atrial fibrillation but is on anticoagulation.  He is able to play a guitar with his left hand.  He has only bothersome symptoms.  He is a former smoker but quit over 30 years ago.  Other medical problems include hypertension has been stable.  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  Past Medical History:  Diagnosis Date  . Allergic rhinitis, cause unspecified   . Atrial fibrillation ([place])   . BPH (benign prostatic hypertrophy)   . CHF (congestive heart failure) ([place])   . Essential hypertension, benign   . GERD (gastroesophageal reflux disease)   . Glaucoma   . Hepatic cyst   . Hiatal hernia 2014  . History of gout   . Hyperlipidemia   . Irritable bowel syndrome   . Lactose intolerance   . Personal history of colonic polyps 03/04/1992   adenomatous polyp  . Pneumonia    "~ 5 times in my lifetime; last time was 03/2014" (10/14/2017)  . Reflux esophagitis   . Schatzki's ring 2014  . Sleep apnea   . Vitamin B12 deficiency    Past Surgical History:  Procedure Laterality Date  . CATARACT EXTRACTION W/ INTRAOCULAR LENS IMPLANT Left   . HYDROCELE EXCISION Bilateral 10/2016   Archie Endo 10/11/2016  . INTRAVASCULAR PRESSURE WIRE/FFR STUDY N/A 10/17/2017   Procedure: INTRAVASCULAR PRESSURE WIRE/FFR STUDY;  Surgeon: Nigel Mormon, MD;  Location: [facility];  Service: Cardiovascular;  Laterality: N/A;  . NASAL RECONSTRUCTION     "tried to fix my nose so I can smell; still can't smell" (10/14/2017)  . RIGHT/LEFT HEART CATH AND CORONARY ANGIOGRAPHY N/A 10/17/2017   Procedure: RIGHT/LEFT HEART CATH AND CORONARY ANGIOGRAPHY;  Surgeon: Nigel Mormon, MD;  Location: [facility];  Service: Cardiovascular;  Laterality: N/A;  . TRANSURETHRAL RESECTION OF PROSTATE  10/2007   Archie Endo 08/22/2007  . ULTRASOUND GUIDANCE FOR VASCULAR ACCESS  10/17/2017   Procedure: Ultrasound Guidance For Vascular Access;  Surgeon: Nigel Mormon, MD;  Location: [facility];  Service: Cardiovascular;;    Family History  Problem Relation Age of Onset  . Heart disease Sister   . Colon cancer Neg Hx   . Esophageal cancer Neg Hx   . Diabetes Neg Hx     SOCIAL HISTORY: Social History   Socioeconomic History  . Marital status: Married    Spouse name: Not on file  . Number of children: 2  . Years of education: Not on file  . Highest education level: Not on file  Occupational History  . Not on file  Tobacco Use  . Smoking status: Former Smoker    Packs/day: 1.00    Years: 30.00    Pack years: 30.00    Types: Cigarettes  Quit date: 11/02/1983    Years since quitting: 36.3  . Smokeless tobacco: Never Used  Substance and Sexual Activity  . Alcohol use: No  . Drug use: No  . Sexual activity: Not on file  Other Topics Concern  . Not on file  Social History Narrative  . Not on file   Social Determinants of Health   Financial Resource Strain:   . Difficulty of Paying Living Expenses:   Food Insecurity:   . Worried About Charity fundraiser in the Last Year:   . Arboriculturist in the Last Year:   Transportation Needs:   . Film/video editor (Medical):   [name] Lack of Transportation (Non-Medical):   Physical Activity:   . Days of Exercise per Week:   . Minutes of Exercise per Session:   Stress:    . Feeling of Stress :   Social Connections:   . Frequency of Communication with Friends and Family:   . Frequency of Social Gatherings with Friends and Family:   . Attends Religious Services:   . Active Member of Clubs or Organizations:   . Attends Archivist Meetings:   [name] Marital Status:   Intimate Partner Violence:   . Fear of Current or Ex-Partner:   . Emotionally Abused:   [name] Physically Abused:   . Sexually Abused:     Allergies  Allergen Reactions  . Lactose Intolerance (Gi) Other (See Comments)    GI upset    Current Outpatient Medications  Medication Sig Dispense Refill  . dorzolamide (TRUSOPT) 2 % ophthalmic solution 1 drop 2 (two) times daily.    . dorzolamide-timolol (COSOPT) 22.3-6.8 MG/ML ophthalmic solution 1 drop 2 (two) times daily.    . fluticasone (FLONASE) 50 MCG/ACT nasal spray 1 spray each nostril 1-2 times daily    . furosemide (LASIX) 20 MG tablet Take 20 mg by mouth as needed for fluid or edema (If he gains more than 2 pounds in 24hr or more than 5 pounds in a week.).     [name] GOODSENSE ALL DAY ALLERGY 10 MG tablet Take 10 mg by mouth daily.    [name] latanoprost (XALATAN) 0.005 % ophthalmic solution 1 drop at bedtime.    [name] levobunolol (BETAGAN) 0.5 % ophthalmic solution 1 drop every morning.    [name] levocetirizine (XYZAL) 5 MG tablet Take 5 mg by mouth at bedtime.    . metoprolol (TOPROL-XL) 200 MG 24 hr tablet TAKE 0.5 TABLETS (100 MG TOTAL) BY MOUTH 2 (TWO) TIMES DAILY. 90 tablet 1  . Polyethyl Glyc-Propyl Glyc PF (SYSTANE HYDRATION PF) 0.4-0.3 % SOLN Apply to eye.    . potassium chloride (KLOR-CON) 10 MEQ tablet Take 10 mEq by mouth once.    . prednisoLONE acetate (PRED FORTE) 1 % ophthalmic suspension INSTILL ONE DROP INTO THE RIGHT EYE 4 TIMES A DAY    . rivaroxaban (XARELTO) 20 MG TABS tablet Take 1 tablet (20 mg total) by mouth daily with supper. 90 tablet 3  . sacubitril-valsartan (ENTRESTO) 97-103 MG Take 1 tablet by mouth 2 (two) times daily.     [name] spironolactone (ALDACTONE) 50 MG tablet Take 50 mg by mouth daily.    . Travoprost, BAK Free, (TRAVATAN) 0.004 % SOLN ophthalmic solution Place 1 drop into both eyes nightly.    . montelukast (SINGULAIR) 10 MG tablet Take by mouth daily.     Current Facility-Administered Medications  Medication Dose Route Frequency Provider Last Rate Last Admin  . cyanocobalamin ((VITAMIN  B-12)) injection 1,000 mcg  1,000 mcg Intramuscular Q30 days Sable Feil, MD   1,000 mcg at 12/04/12 0817    ROS:   General:  No weight loss, Fever, chills  HEENT: No recent headaches, no nasal bleeding, no visual changes, no sore throat  Neurologic: No dizziness, blackouts, seizures. No recent symptoms of stroke or mini- stroke. No recent episodes of slurred speech, or temporary blindness.  Cardiac: No recent episodes of chest pain/pressure, no shortness of breath at rest.  No shortness of breath with exertion.  Denies history of atrial fibrillation or irregular heartbeat  Vascular: No history of rest pain in feet.  No history of claudication.  No history of non-healing ulcer, No history of DVT   Pulmonary: No home oxygen, no productive cough, no hemoptysis,  No asthma or wheezing  Musculoskeletal:  [ ]  Arthritis, [ ]  Low back pain,  [ ]  Joint pain  Hematologic:No history of hypercoagulable state.  No history of easy bleeding.  No history of anemia  Gastrointestinal: No hematochezia or melena,  No gastroesophageal reflux, no trouble swallowing  Urinary: [ ]  chronic Kidney disease, [ ]  on HD - [ ]  MWF or [ ]  TTHS, [ ]  Burning with urination, [ ]  Frequent urination, [ ]  Difficulty urinating;   Skin: No rashes  Psychological: No history of anxiety,  No history of depression   Physical Examination  Vitals:   02/14/20 0946  BP: (!) 158/78  Pulse: 63  Resp: 20  Temp: 97.6 F (36.4 C)  SpO2: 95%  Weight: 183 lb (83 kg)  Height: 5\' 11"  (1.803 m)    Body mass index is 25.52 kg/m.  General:   Alert and oriented, no acute distress HEENT: Normal Neck: No bruit or JVD Pulmonary: Clear to auscultation bilaterally Cardiac: Regular Rate and Rhythm  Abdomen: Soft, non-tender, non-distended, no mass Skin: No rash Extremity Pulses:  2+ left radial, brachial, absent right radial 2+ femoral, popliteal dorsalis pedis, posterior tibial pulses bilaterally Musculoskeletal: No deformity or edema  Neurologic: Upper and lower extremity motor 5/5 and symmetric  DATA:  Patient had bilateral ABIs today which showed vessel calcification.  ABI on the right side was 0.95 triphasic, left side 1.0 triphasic, right toe pressure 153, left toe pressure 170.  This was essentially a normal study.  ASSESSMENT:  1.  Left first and second digit numbness and tingling.  Does not seem to have a vascular origin.  Potentially could be related to some nerve compression or carpal tunnel.  Patient states he was not bothered by this enough to consider referral to a hand surgeon.  2.  No significant lower extremity arterial occlusive disease.  Coolness symptoms in his right leg may be a component of neuropathy.  I discussed with him the possibility of starting a medication for this.  However, this would have a side effect of some sedating effect.  He would prefer not to have any other medications added currently.  PLAN: No significant arterial occlusive disease to explain the patient's current symptoms.  He will follow up on an as-needed basis.   Ruta Hinds, MD [facility] Office: [phone] Pager: [phone]

## 2020-02-21 ENCOUNTER — Inpatient Hospital Stay: Payer: Medicare HMO | Attending: Physician Assistant

## 2020-03-20 ENCOUNTER — Inpatient Hospital Stay: Payer: Medicare HMO | Attending: Physician Assistant

## 2020-04-01 ENCOUNTER — Other Ambulatory Visit: Payer: Self-pay

## 2020-04-01 MED ORDER — ENTRESTO 97-103 MG PO TABS: 1.0000 | ORAL_TABLET | Freq: Two times a day (BID) | ORAL | 1 refills | Status: DC

## 2020-04-09 ENCOUNTER — Ambulatory Visit: Payer: Medicare HMO | Admitting: Cardiology

## 2020-04-09 ENCOUNTER — Other Ambulatory Visit: Payer: Self-pay

## 2020-04-09 ENCOUNTER — Encounter: Payer: Self-pay | Admitting: Cardiology

## 2020-04-09 VITALS — BP 163/91 | HR 68 | Resp 15 | Ht 71.0 in | Wt 180.8 lb

## 2020-04-09 DIAGNOSIS — I5022 Chronic systolic (congestive) heart failure: Secondary | ICD-10-CM

## 2020-04-09 DIAGNOSIS — I4821 Permanent atrial fibrillation: Secondary | ICD-10-CM

## 2020-04-09 DIAGNOSIS — Z87891 Personal history of nicotine dependence: Secondary | ICD-10-CM

## 2020-04-09 DIAGNOSIS — I110 Hypertensive heart disease with heart failure: Secondary | ICD-10-CM

## 2020-04-09 DIAGNOSIS — I2510 Atherosclerotic heart disease of native coronary artery without angina pectoris: Secondary | ICD-10-CM

## 2020-04-09 DIAGNOSIS — Z7901 Long term (current) use of anticoagulants: Secondary | ICD-10-CM

## 2020-04-09 DIAGNOSIS — I119 Hypertensive heart disease without heart failure: Secondary | ICD-10-CM

## 2020-04-09 DIAGNOSIS — I428 Other cardiomyopathies: Secondary | ICD-10-CM

## 2020-04-09 MED ORDER — ENTRESTO 97-103 MG PO TABS: 1.0000 | ORAL_TABLET | Freq: Two times a day (BID) | ORAL | 0 refills | Status: AC

## 2020-04-09 NOTE — Progress Notes (Signed)
[name] Date of Birth: [birth year] MRN: [id number] Primary Care Provider:Kaplan, Baldemar Friday., PA-C Former Cardiology Providers: Dr. Vear Clock Primary Cardiologist: Rex Kras, DO, [facility] (established care 12/31/2019)  Date: 04/09/20 Last Visit: 12/31/2019   Chief Complaint  Patient presents with  . Follow-up    Fatigue per patient     HPI  Ronald [name] is a 86 y.o.  male who presents to the office with a chief complaint of " heart failure management and fatigue." Patient's past medical history and cardiovascular risk factors include: dilated nonischemic cardiomyopathy, heart failure with reduced EF, permanent atrial fibrillation, nonobstructive coronary artery disease, hypertension, former smoker, advanced age.  Patient is accompanied by his wife at today's office visit.  In the past patient was under the care of Dr. Woody Seller and recently transition to his care to me back in March 2021.   Heart failure with reduced EF: Patient is noted to have nonobstructive coronary artery disease and permanent atrial fibrillation.  His most recent echocardiogram results noted reduced left ventricular systolic function and he is currently being treated for chronic heart failure with reduced EF.  Since last office visit patient states that he does not have any heart failure symptoms.  No recent hospitalizations or urgent care visits for cardiovascular symptoms.  No prior history of heart failure admissions.  Patient has lost 4 pounds since last office visit.  Patient states that he ran out of Entresto more than 1 week ago.  And for reasons unknown he started taking double dose of metoprolol.  His heart rate is currently well controlled.  But I believe his underlying fatigue could be secondary to increased beta-blocker destruction.   Permanent atrial fibrillation: Patient states that he has had atrial fibrillation for several years.  He has not undergone cardioversion according to his memory.  When was  seeing Dr. Woody Seller he was considered to have permanent atrial fibrillation. He is currently managed by rate control and thromboembolic prophylaxis.  Patient is currently on Xarelto and does not endorse any evidence of bleeding.  At last office visit he was encouraged to take his Xarelto with supper.  However, he continues to take his Xarelto in the morning.  ALLERGIES: Allergies  Allergen Reactions  . Lactose Intolerance (Gi) Other (See Comments)    GI upset     MEDICATION LIST PRIOR TO VISIT: Current Outpatient Medications on File Prior to Visit  Medication Sig Dispense Refill  . dorzolamide (TRUSOPT) 2 % ophthalmic solution 1 drop 2 (two) times daily.    . dorzolamide-timolol (COSOPT) 22.3-6.8 MG/ML ophthalmic solution 1 drop 2 (two) times daily.    . fluticasone (FLONASE) 50 MCG/ACT nasal spray 1 spray each nostril 1-2 times daily    . GOODSENSE ALL DAY ALLERGY 10 MG tablet Take 10 mg by mouth daily.    [name] latanoprost (XALATAN) 0.005 % ophthalmic solution 1 drop at bedtime.    [name] levobunolol (BETAGAN) 0.5 % ophthalmic solution 1 drop every morning.    [name] levocetirizine (XYZAL) 5 MG tablet Take 5 mg by mouth at bedtime.    . metoprolol (TOPROL-XL) 200 MG 24 hr tablet TAKE 0.5 TABLETS (100 MG TOTAL) BY MOUTH 2 (TWO) TIMES DAILY. (Patient taking differently: Take 200 mg by mouth daily. ) 90 tablet 1  . Polyethyl Glyc-Propyl Glyc PF (SYSTANE HYDRATION PF) 0.4-0.3 % SOLN Apply to eye.    . potassium chloride (KLOR-CON) 10 MEQ tablet Take 10 mEq by mouth once.    . prednisoLONE acetate (PRED  FORTE) 1 % ophthalmic suspension INSTILL ONE DROP INTO THE RIGHT EYE 4 TIMES A DAY    . rivaroxaban (XARELTO) 20 MG TABS tablet Take 1 tablet (20 mg total) by mouth daily with supper. 90 tablet 3  . spironolactone (ALDACTONE) 25 MG tablet Take 25 mg by mouth daily.     . Travoprost, BAK Free, (TRAVATAN) 0.004 % SOLN ophthalmic solution Place 1 drop into both eyes nightly.    . montelukast (SINGULAIR) 10 MG  tablet Take by mouth daily.     Current Facility-Administered Medications on File Prior to Visit  Medication Dose Route Frequency Provider Last Rate Last Admin  . cyanocobalamin ((VITAMIN B-12)) injection 1,000 mcg  1,000 mcg Intramuscular Q30 days Sable Feil, MD   1,000 mcg at 12/04/12 5366    PAST MEDICAL HISTORY: Past Medical History:  Diagnosis Date  . Allergic rhinitis, cause unspecified   . Atrial fibrillation ([place])   . BPH (benign prostatic hypertrophy)   . CHF (congestive heart failure) ([place])   . Essential hypertension, benign   . GERD (gastroesophageal reflux disease)   . Glaucoma   . Hepatic cyst   . Hiatal hernia 2014  . History of gout   . Hyperlipidemia   . Irritable bowel syndrome   . Lactose intolerance   . Personal history of colonic polyps 03/04/1992   adenomatous polyp  . Pneumonia    "~ 5 times in my lifetime; last time was 03/2014" (10/14/2017)  . Reflux esophagitis   . Schatzki's ring 2014  . Sleep apnea   . Vitamin B12 deficiency     PAST SURGICAL HISTORY: Past Surgical History:  Procedure Laterality Date  . CATARACT EXTRACTION W/ INTRAOCULAR LENS IMPLANT Left   . HYDROCELE EXCISION Bilateral 10/2016   Archie Endo 10/11/2016  . INTRAVASCULAR PRESSURE WIRE/FFR STUDY N/A 10/17/2017   Procedure: INTRAVASCULAR PRESSURE WIRE/FFR STUDY;  Surgeon: Nigel Mormon, MD;  Location: [place] [facility];  Service: Cardiovascular;  Laterality: N/A;  . NASAL RECONSTRUCTION     "tried to fix my nose so I can smell; still can't smell" (10/14/2017)  . RIGHT/LEFT HEART CATH AND CORONARY ANGIOGRAPHY N/A 10/17/2017   Procedure: RIGHT/LEFT HEART CATH AND CORONARY ANGIOGRAPHY;  Surgeon: Nigel Mormon, MD;  Location: [place] [facility];  Service: Cardiovascular;  Laterality: N/A;  . TRANSURETHRAL RESECTION OF PROSTATE  10/2007   Archie Endo 08/22/2007  . ULTRASOUND GUIDANCE FOR VASCULAR ACCESS  10/17/2017   Procedure: Ultrasound Guidance For Vascular Access;   Surgeon: Nigel Mormon, MD;  Location: [place] [facility];  Service: Cardiovascular;;    FAMILY HISTORY: The patient's family history includes Heart disease in his sister.   SOCIAL HISTORY:  The patient  reports that he quit smoking about 36 years ago. His smoking use included cigarettes. He has a 30.00 pack-year smoking history. He has never used smokeless tobacco. He reports that he does not drink alcohol or use drugs.  Review of Systems  Constitution: Negative for chills and fever.  HENT: Negative for hoarse voice and nosebleeds.   Eyes: Negative for discharge, double vision and pain.  Cardiovascular: Negative for chest pain, claudication, dyspnea on exertion, leg swelling, near-syncope, orthopnea, palpitations, paroxysmal nocturnal dyspnea and syncope.  Respiratory: Negative for hemoptysis and shortness of breath.   Musculoskeletal: Negative for muscle cramps and myalgias.  Gastrointestinal: Negative for abdominal pain, constipation, diarrhea, hematemesis, hematochezia, melena, nausea and vomiting.  Neurological: Negative for dizziness and light-headedness.    PHYSICAL EXAM: Vitals with BMI 04/09/2020  04/09/2020 02/14/2020  Height - 5\' 11"  5\' 11"   Weight - 180 lbs 13 oz 183 lbs  BMI - 96.04 54.09  Systolic 811 914 782  Diastolic 91 956 78  Pulse 68 64 63    CONSTITUTIONAL: Well-developed and well-nourished. No acute distress.  SKIN: Skin is warm and dry. No rash noted. No cyanosis. No pallor. No jaundice HEAD: Normocephalic and atraumatic.  EYES: No scleral icterus MOUTH/THROAT: Moist oral membranes.  NECK: No JVD present. No thyromegaly noted. No carotid bruits  LYMPHATIC: No visible cervical adenopathy.  CHEST Normal respiratory effort. No intercostal retractions  LUNGS: Clear to auscultation bilaterally.  No stridor. No wheezes. No rales.  CARDIOVASCULAR: Irregularly irregular, soft systolic ejection murmur heard at the right intercostal space, a 3 out of 6  holosystolic murmur heard at the apex, no gallops or rubs. ABDOMINAL: No apparent ascites.  EXTREMITIES: No peripheral edema  HEMATOLOGIC: No significant bruising NEUROLOGIC: Oriented to person, place, and time. Nonfocal. Normal muscle tone.  PSYCHIATRIC: Normal mood and affect. Normal behavior. Cooperative  CARDIAC DATABASE: EKG: 12/31/2019: Atrial fibrillation with controlled ventricular rate. Voltage criteria for LVH  (R(V6) exceeds 2.26 mV).  Echocardiogram: 11/07/2018: LVEF 35-40%, calculated LVEF 41%, mild LVH, biatrial dilation, RV mildly dilated, RV function low normal, mild MR, moderate to severe TR.  Stress Testing:  Lexiscan myoview stress test 08/08/2017: 1. Low risk of hemodynamically significant coronary artery disease. Prognostically, this is a high risk study. The LV is dilated both in rest and stress images with severe global hypokinesis. 2. Rest and stress EKG show A. FIb and occasional PVC. Non diagnostic due to pharmacologic stress. 3. Fixed perfusion defect due to diaphragmatic attenuation. 4. The gated study showed the ejection fraction was <10%.  Heart Catheterization: RHC/LHC 10/17/2017: Nonobstructive coronary artery disease- 40% stenosis mid LAD, 60% second diagonal, 50% stenosis of first obtuse marginal. Nonischemic cardiomyopathy WHO Grp II pulmonary hypertension mPAP 30 mmhg.  LABORATORY DATA: CBC Latest Ref Rng & Units 01/23/2020 12/26/2019 09/18/2019  WBC 4.0 - 10.5 K/uL 2.4(L) 2.4(L) 2.2(L)  Hemoglobin 13.0 - 17.0 g/dL 14.2 14.4 13.7  Hematocrit 39.0 - 52.0 % 45.3 46.7 42.8  Platelets 150 - 400 K/uL 119(L) 125(L) 113(L)    CMP Latest Ref Rng & Units 12/28/2019 12/26/2019 09/03/2019  Glucose 65 - 99 mg/dL 94 75 87  BUN 8 - 27 mg/dL 11 13 16   Creatinine 0.76 - 1.27 mg/dL 1.20 1.17 1.15  Sodium 134 - 144 mmol/L 147(H) 147(H) 145  Potassium 3.5 - 5.2 mmol/L 4.5 4.1 4.2  Chloride 96 - 106 mmol/L 108(H) 109 107  CO2 20 - 29 mmol/L 28 33(H) 31  Calcium 8.6 -  10.2 mg/dL 9.0 9.0 9.0  Total Protein 6.0 - 8.5 g/dL 6.3 6.5 6.6  Total Bilirubin 0.0 - 1.2 mg/dL 0.9 1.2 1.1  Alkaline Phos 39 - 117 IU/L 62 59 62  AST 0 - 40 IU/L 12 14(L) 13(L)  ALT 0 - 44 IU/L 5 7 8     Lipid Panel     Component Value Date/Time   CHOL 86 03/25/2014 0235   TRIG 60 03/25/2014 0235   HDL 35 (L) 03/25/2014 0235   CHOLHDL 2.5 03/25/2014 0235   VLDL 12 03/25/2014 0235   LDLCALC 39 03/25/2014 0235    Lab Results  Component Value Date   HGBA1C 5.8 (H) 03/26/2014   No components found for: NTPROBNP Lab Results  Component Value Date   TSH 0.84 08/10/2013   TSH 1.01 11/20/2010  Cardiac Panel (last 3 results) No results for input(s): CKTOTAL, CKMB, TROPONINIHS, RELINDX in the last 72 hours.  IMPRESSION:    ICD-10-CM   1. Chronic HFrEF (heart failure with reduced ejection fraction) (HCC)  I50.22 sacubitril-valsartan (ENTRESTO) 97-103 MG    Basic Metabolic Panel (BMET)    Magnesium  2. Nonischemic cardiomyopathy (HCC)  I42.8   3. Hypertensive heart disease with heart failure (HCC)  I11.0   4. Hypertension with heart disease  I11.9   5. Permanent atrial fibrillation (HCC)  I48.21   6. Nonobstructive atherosclerosis of coronary artery  I25.10   7. Former smoker  Z87.891   41. Long term (current) use of anticoagulants  Z79.01      RECOMMENDATIONS: Ronald [name] is a 86 y.o. male whose past medical history and cardiovascular risk factors include: dilated nonischemic cardiomyopathy, heart failure with reduced EF, permanent atrial fibrillation, nonobstructive coronary artery disease, hypertension, former smoker, advanced age.  Chronic heart failure with reduced EF, stage C, NYHA class II:  Currently on guideline directed medical therapy: Beta-blocker therapy, Entresto, Aldactone, Lasix and as needed basis.  Ejection Fraction noted on last 2D Echo  Recommend daily weight check, strict I/O's; patient lost 4 pounds since last office visit.  Fluid  restriction to <2L per day, Na restriction < 2g per day  Patient ran out of Entresto for at least 1 week or more for reasons unknown started doubling up on metoprolol.  Refill Entresto.  Continue metoprolol 200 mg p.o. daily.  Check BMP, magnesium.  Patient is asked to call the office for refill on prescriptions instead of doubling up on her other medications.  Hypertension with heart failure with reduced EF:  Continue current medical therapy.  Blood pressures are not in formal therapy secondary to not being on Entresto for more than 1 week.  Continue to monitor.  Permanent atrial fibrillation:  CHA2DS2-VASc SCORE is 4 which correlates to 4 % risk of stroke per year.  Rate control: Beta-blocker therapy.  Thromboembolic prophylaxis: Xarelto.  Patient is requested to take Xarelto with supper.  Long-term oral anticoagulation:  Indication: Atrial fibrillation.  Patient does not endorse any evidence of bleeding.  Xarelto refilled.  Risks, alternatives, and benefits discussed with the patient in regards oral anticoagulation therapy.  Patient verbalizes understanding that he will seek medical attention to closest ER if he notices bleeding or sustains injury despite mechanism of action.   FINAL MEDICATION LIST END OF ENCOUNTER: Meds ordered this encounter  Medications  . sacubitril-valsartan (ENTRESTO) 97-103 MG    Sig: Take 1 tablet by mouth 2 (two) times daily.    Dispense:  180 tablet    Refill:  0    Medications Discontinued During This Encounter  Medication Reason  . furosemide (LASIX) 20 MG tablet Patient Preference  . sacubitril-valsartan (ENTRESTO) 97-103 MG Reorder     Current Outpatient Medications:  .  dorzolamide (TRUSOPT) 2 % ophthalmic solution, 1 drop 2 (two) times daily., Disp: , Rfl:  .  dorzolamide-timolol (COSOPT) 22.3-6.8 MG/ML ophthalmic solution, 1 drop 2 (two) times daily., Disp: , Rfl:  .  fluticasone (FLONASE) 50 MCG/ACT nasal spray, 1 spray  each nostril 1-2 times daily, Disp: , Rfl:  .  GOODSENSE ALL DAY ALLERGY 10 MG tablet, Take 10 mg by mouth daily., Disp: , Rfl:  .  latanoprost (XALATAN) 0.005 % ophthalmic solution, 1 drop at bedtime., Disp: , Rfl:  .  levobunolol (BETAGAN) 0.5 % ophthalmic solution, 1 drop every morning., Disp: , Rfl:  .  levocetirizine (XYZAL) 5 MG tablet, Take 5 mg by mouth at bedtime., Disp: , Rfl:  .  metoprolol (TOPROL-XL) 200 MG 24 hr tablet, TAKE 0.5 TABLETS (100 MG TOTAL) BY MOUTH 2 (TWO) TIMES DAILY. (Patient taking differently: Take 200 mg by mouth daily. ), Disp: 90 tablet, Rfl: 1 .  Polyethyl Glyc-Propyl Glyc PF (SYSTANE HYDRATION PF) 0.4-0.3 % SOLN, Apply to eye., Disp: , Rfl:  .  potassium chloride (KLOR-CON) 10 MEQ tablet, Take 10 mEq by mouth once., Disp: , Rfl:  .  prednisoLONE acetate (PRED FORTE) 1 % ophthalmic suspension, INSTILL ONE DROP INTO THE RIGHT EYE 4 TIMES A DAY, Disp: , Rfl:  .  rivaroxaban (XARELTO) 20 MG TABS tablet, Take 1 tablet (20 mg total) by mouth daily with supper., Disp: 90 tablet, Rfl: 3 .  sacubitril-valsartan (ENTRESTO) 97-103 MG, Take 1 tablet by mouth 2 (two) times daily., Disp: 180 tablet, Rfl: 0 .  spironolactone (ALDACTONE) 25 MG tablet, Take 25 mg by mouth daily. , Disp: , Rfl:  .  Travoprost, BAK Free, (TRAVATAN) 0.004 % SOLN ophthalmic solution, Place 1 drop into both eyes nightly., Disp: , Rfl:  .  montelukast (SINGULAIR) 10 MG tablet, Take by mouth daily., Disp: , Rfl:   Current Facility-Administered Medications:  .  cyanocobalamin ((VITAMIN B-12)) injection 1,000 mcg, 1,000 mcg, Intramuscular, Q30 days, Sable Feil, MD, 1,000 mcg at 12/04/12 3818  Orders Placed This Encounter  Procedures  . Basic Metabolic Panel (BMET)  . Magnesium   --Continue cardiac medications as reconciled in final medication list. --Return in about 3 months (around 07/10/2020) for heart failure management.. Or sooner if needed. --Continue follow-up with your primary care  physician regarding the management of your other chronic comorbid conditions.  Patient's questions and concerns were addressed to his satisfaction. He voices understanding of the instructions provided during this encounter.   This note was created using a voice recognition software as a result there may be grammatical errors inadvertently enclosed that do not reflect the nature of this encounter. Every attempt is made to correct such errors.  Rex Kras, [place], [facility]  Pager: [phone] Office: [phone]

## 2020-04-10 ENCOUNTER — Telehealth: Payer: Self-pay

## 2020-04-10 LAB — BASIC METABOLIC PANEL
BUN/Creatinine Ratio: 13 (ref 10–24)
BUN: 17 mg/dL (ref 8–27)
CO2: 27 mmol/L (ref 20–29)
Calcium: 9.8 mg/dL (ref 8.6–10.2)
Chloride: 104 mmol/L (ref 96–106)
Creatinine, Ser: 1.27 mg/dL (ref 0.76–1.27)
GFR calc Af Amer: 60 mL/min/{1.73_m2} (ref >59)
GFR calc non Af Amer: 52 mL/min/{1.73_m2} — ABNORMAL LOW (ref >59)
Glucose: 82 mg/dL (ref 65–99)
Potassium: 4.8 mmol/L (ref 3.5–5.2)
Sodium: 145 mmol/L — ABNORMAL HIGH (ref 134–144)

## 2020-04-10 LAB — MAGNESIUM: Magnesium: 2.1 mg/dL (ref 1.6–2.3)

## 2020-04-10 NOTE — Telephone Encounter (Signed)
Gave results to patient, he verbalized understanding.

## 2020-04-10 NOTE — Telephone Encounter (Signed)
-----   Message from [place], [place] sent at 04/10/2020  8:54 AM EDT ----- Results reviewed. Serum potassium and magnesium levels are within normal limits. Kidney function is relatively at baseline. Continue current medical therapy. Call if questions arise

## 2020-04-24 ENCOUNTER — Inpatient Hospital Stay: Payer: Medicare HMO

## 2020-04-24 ENCOUNTER — Inpatient Hospital Stay: Payer: Medicare HMO | Attending: Physician Assistant | Admitting: Internal Medicine

## 2020-06-17 LAB — BASIC METABOLIC PANEL
BUN/Creatinine Ratio: 10 (ref 10–24)
BUN: 12 mg/dL (ref 8–27)
CO2: 30 mmol/L — ABNORMAL HIGH (ref 20–29)
Calcium: 9.2 mg/dL (ref 8.6–10.2)
Chloride: 103 mmol/L (ref 96–106)
Creatinine, Ser: 1.16 mg/dL (ref 0.76–1.27)
GFR calc Af Amer: 66 mL/min/{1.73_m2} (ref >59)
GFR calc non Af Amer: 58 mL/min/{1.73_m2} — ABNORMAL LOW (ref >59)
Glucose: 99 mg/dL (ref 65–99)
Potassium: 3.7 mmol/L (ref 3.5–5.2)
Sodium: 145 mmol/L — ABNORMAL HIGH (ref 134–144)

## 2020-06-17 LAB — LIPID PANEL WITH LDL/HDL RATIO
Cholesterol, Total: 124 mg/dL (ref 100–199)
HDL: 51 mg/dL (ref >39)
LDL Chol Calc (NIH): 59 mg/dL (ref 0–99)
LDL/HDL Ratio: 1.2 ratio (ref 0.0–3.6)
Triglycerides: 68 mg/dL (ref 0–149)
VLDL Cholesterol Cal: 14 mg/dL (ref 5–40)

## 2020-06-17 LAB — MAGNESIUM: Magnesium: 2 mg/dL (ref 1.6–2.3)

## 2020-06-17 LAB — PRO B NATRIURETIC PEPTIDE: NT-Pro BNP: 1655 pg/mL — ABNORMAL HIGH (ref 0–486)

## 2020-06-18 ENCOUNTER — Ambulatory Visit: Payer: Medicare HMO | Admitting: Cardiology

## 2020-06-19 ENCOUNTER — Other Ambulatory Visit: Payer: Self-pay

## 2020-06-19 ENCOUNTER — Encounter: Payer: Self-pay | Admitting: Cardiology

## 2020-06-19 ENCOUNTER — Ambulatory Visit: Payer: Medicare HMO | Admitting: Cardiology

## 2020-06-19 VITALS — BP 136/78 | HR 65 | Resp 16 | Ht 71.0 in | Wt 179.0 lb

## 2020-06-19 DIAGNOSIS — I2510 Atherosclerotic heart disease of native coronary artery without angina pectoris: Secondary | ICD-10-CM

## 2020-06-19 DIAGNOSIS — I4821 Permanent atrial fibrillation: Secondary | ICD-10-CM

## 2020-06-19 DIAGNOSIS — I119 Hypertensive heart disease without heart failure: Secondary | ICD-10-CM

## 2020-06-19 DIAGNOSIS — I428 Other cardiomyopathies: Secondary | ICD-10-CM

## 2020-06-19 DIAGNOSIS — I110 Hypertensive heart disease with heart failure: Secondary | ICD-10-CM

## 2020-06-19 DIAGNOSIS — Z7901 Long term (current) use of anticoagulants: Secondary | ICD-10-CM

## 2020-06-19 DIAGNOSIS — Z87891 Personal history of nicotine dependence: Secondary | ICD-10-CM

## 2020-06-19 DIAGNOSIS — I5022 Chronic systolic (congestive) heart failure: Secondary | ICD-10-CM

## 2020-06-19 MED ORDER — METOPROLOL SUCCINATE ER 200 MG PO TB24: 200.0000 mg | ORAL_TABLET | Freq: Every morning | ORAL | 0 refills | Status: DC

## 2020-06-19 NOTE — Progress Notes (Signed)
Ronald [name] Date of Birth: 06/17/1934 MRN: [id number] Primary Care Provider:Kaplan, Baldemar Friday., PA-C Former Cardiology Providers: Dr. Vear Clock Primary Cardiologist: Rex Kras, DO, [facility] (established care 12/31/2019)  Date: 06/19/2020 Last Office Visit: 04/09/2020  Chief Complaint  Patient presents with  . HFrEF  . Follow-up    3 month    HPI  Ronald [name] is a 86 y.o.  male who presents to the office with a chief complaint of " heart failure management and fatigue." Patient's past medical history and cardiovascular risk factors include: dilated nonischemic cardiomyopathy, heart failure with reduced EF, permanent atrial fibrillation, nonobstructive coronary artery disease, hypertension, former smoker, advanced age.  Patient is accompanied by his wife at today's office visit.  In the past patient was under the care of Dr. Woody Seller and recently transition to his care to me back in March 2021.   Heart failure with reduced EF: Patient is noted to have nonobstructive coronary artery disease and permanent atrial fibrillation.  His most recent echocardiogram results noted reduced left ventricular systolic function and he is currently being treated for chronic heart failure with reduced EF.  Since last office visit patient states that he does not have any heart failure symptoms.  No recent hospitalizations or urgent care visits for cardiovascular symptoms.  No prior history of heart failure admissions.  Patient has lost 1 pounds since last office visit.  Patient states that he is compliant with his medical therapy.  However, he forgot to bring all his cardiac medications with him at today's office visit for med reconciliation.  Patient states that he will call us back so that his medications are accurately reconciled once he reaches home.  Since last office visit patient had blood work which was independently reviewed with him at today's office visit.  Unfortunately, patient has been under some  stress at home as his wife is currently in a nursing home.  Permanent atrial fibrillation: Patient states that he has had atrial fibrillation for several years.  He has not undergone cardioversion according to his memory.  When was seeing Dr. Woody Seller he was considered to have permanent atrial fibrillation. He is currently managed by rate control and thromboembolic prophylaxis.  Patient is currently on Xarelto and does not endorse any evidence of bleeding.  At last office visit he was encouraged to take his Xarelto with supper.    ALLERGIES: Allergies  Allergen Reactions  . Lactose Intolerance (Gi) Other (See Comments)    GI upset     MEDICATION LIST PRIOR TO VISIT: Current Outpatient Medications on File Prior to Visit  Medication Sig Dispense Refill  . dorzolamide-timolol (COSOPT) 22.3-6.8 MG/ML ophthalmic solution 1 drop 2 (two) times daily.    . fluticasone (FLONASE) 50 MCG/ACT nasal spray 1 spray each nostril 1-2 times daily    . latanoprost (XALATAN) 0.005 % ophthalmic solution 1 drop at bedtime.    [name] levobunolol (BETAGAN) 0.5 % ophthalmic solution 1 drop every morning.    [name] levocetirizine (XYZAL) 5 MG tablet Take 5 mg by mouth at bedtime.    . montelukast (SINGULAIR) 10 MG tablet Take 1 tablet by mouth daily.    Vladimir Faster Glyc-Propyl Glyc PF (SYSTANE HYDRATION PF) 0.4-0.3 % SOLN Apply to eye.    . potassium chloride (KLOR-CON) 10 MEQ tablet Take 10 mEq by mouth once.    . prednisoLONE acetate (PRED FORTE) 1 % ophthalmic suspension INSTILL ONE DROP INTO THE RIGHT EYE 4 TIMES A DAY    . rivaroxaban (  XARELTO) 20 MG TABS tablet Take 1 tablet (20 mg total) by mouth daily with supper. 90 tablet 3  . sacubitril-valsartan (ENTRESTO) 97-103 MG Take 1 tablet by mouth 2 (two) times daily. 180 tablet 0  . Travoprost, BAK Free, (TRAVATAN) 0.004 % SOLN ophthalmic solution Place 1 drop into both eyes nightly.    [name] spironolactone (ALDACTONE) 25 MG tablet Take 25 mg by mouth daily.      Current  Facility-Administered Medications on File Prior to Visit  Medication Dose Route Frequency Provider Last Rate Last Admin  . cyanocobalamin ((VITAMIN B-12)) injection 1,000 mcg  1,000 mcg Intramuscular Q30 days Sable Feil, MD   1,000 mcg at 12/04/12 9892    PAST MEDICAL HISTORY: Past Medical History:  Diagnosis Date  . Allergic rhinitis, cause unspecified   . Atrial fibrillation ([place])   . BPH (benign prostatic hypertrophy)   . CHF (congestive heart failure) ([place])   . Essential hypertension, benign   . GERD (gastroesophageal reflux disease)   . Glaucoma   . Hepatic cyst   . Hiatal hernia 2014  . History of gout   . Hyperlipidemia   . Irritable bowel syndrome   . Lactose intolerance   . Personal history of colonic polyps 03/04/1992   adenomatous polyp  . Pneumonia    "~ 5 times in my lifetime; last time was 03/2014" (10/14/2017)  . Reflux esophagitis   . Schatzki's ring 2014  . Sleep apnea   . Vitamin B12 deficiency     PAST SURGICAL HISTORY: Past Surgical History:  Procedure Laterality Date  . CATARACT EXTRACTION W/ INTRAOCULAR LENS IMPLANT Left   . HYDROCELE EXCISION Bilateral 10/2016   Archie Endo 10/11/2016  . INTRAVASCULAR PRESSURE WIRE/FFR STUDY N/A 10/17/2017   Procedure: INTRAVASCULAR PRESSURE WIRE/FFR STUDY;  Surgeon: Nigel Mormon, MD;  Location: [facility];  Service: Cardiovascular;  Laterality: N/A;  . NASAL RECONSTRUCTION     "tried to fix my nose so I can smell; still can't smell" (10/14/2017)  . RIGHT/LEFT HEART CATH AND CORONARY ANGIOGRAPHY N/A 10/17/2017   Procedure: RIGHT/LEFT HEART CATH AND CORONARY ANGIOGRAPHY;  Surgeon: Nigel Mormon, MD;  Location: [facility];  Service: Cardiovascular;  Laterality: N/A;  . TRANSURETHRAL RESECTION OF PROSTATE  10/2007   Archie Endo 08/22/2007  . ULTRASOUND GUIDANCE FOR VASCULAR ACCESS  10/17/2017   Procedure: Ultrasound Guidance For Vascular Access;  Surgeon: Nigel Mormon, MD;   Location: [facility];  Service: Cardiovascular;;    FAMILY HISTORY: The patient's family history includes Heart disease in his sister.   SOCIAL HISTORY:  The patient  reports that he quit smoking about 36 years ago. His smoking use included cigarettes. He has a 30.00 pack-year smoking history. He has never used smokeless tobacco. He reports that he does not drink alcohol and does not use drugs.  Review of Systems  Constitutional: Negative for chills and fever.  HENT: Negative for hoarse voice and nosebleeds.   Eyes: Negative for discharge, double vision and pain.  Cardiovascular: Negative for chest pain, claudication, dyspnea on exertion, leg swelling, near-syncope, orthopnea, palpitations, paroxysmal nocturnal dyspnea and syncope.  Respiratory: Negative for hemoptysis and shortness of breath.   Musculoskeletal: Negative for muscle cramps and myalgias.  Gastrointestinal: Negative for abdominal pain, constipation, diarrhea, hematemesis, hematochezia, melena, nausea and vomiting.  Neurological: Negative for dizziness and light-headedness.   PHYSICAL EXAM: Vitals with BMI 06/19/2020 04/09/2020 04/09/2020  Height 5\' 11"  - 5\' 11"   Weight 179 lbs - 180  lbs 13 oz  BMI 00.86 - 76.19  Systolic 509 326 712  Diastolic 78 91 458  Pulse 65 68 64    CONSTITUTIONAL: Well-developed and well-nourished. No acute distress.  SKIN: Skin is warm and dry. No rash noted. No cyanosis. No pallor. No jaundice HEAD: Normocephalic and atraumatic.  EYES: No scleral icterus MOUTH/THROAT: Moist oral membranes.  NECK: No JVD present. No thyromegaly noted. No carotid bruits  LYMPHATIC: No visible cervical adenopathy.  CHEST Normal respiratory effort. No intercostal retractions  LUNGS: Clear to auscultation bilaterally.  No stridor. No wheezes. No rales.  CARDIOVASCULAR: Irregularly irregular, soft systolic ejection murmur heard at the right intercostal space, a 3 out of 6 holosystolic murmur heard at the  apex, no gallops or rubs. ABDOMINAL: No apparent ascites.  EXTREMITIES: No peripheral edema  HEMATOLOGIC: No significant bruising NEUROLOGIC: Oriented to person, place, and time. Nonfocal. Normal muscle tone.  PSYCHIATRIC: Normal mood and affect. Normal behavior. Cooperative  CARDIAC DATABASE: EKG: 12/31/2019: Atrial fibrillation with controlled ventricular rate. Voltage criteria for LVH  (R(V6) exceeds 2.26 mV).  Echocardiogram: 11/07/2018: LVEF 35-40%, calculated LVEF 41%, mild LVH, biatrial dilation, RV mildly dilated, RV function low normal, mild MR, moderate to severe TR.  Stress Testing:  Lexiscan myoview stress test 08/08/2017: 1. Low risk of hemodynamically significant coronary artery disease. Prognostically, this is a high risk study. The LV is dilated both in rest and stress images with severe global hypokinesis. 2. Rest and stress EKG show A. FIb and occasional PVC. Non diagnostic due to pharmacologic stress. 3. Fixed perfusion defect due to diaphragmatic attenuation. 4. The gated study showed the ejection fraction was <10%.  Heart Catheterization: RHC/LHC 10/17/2017: Nonobstructive coronary artery disease- 40% stenosis mid LAD, 60% second diagonal, 50% stenosis of first obtuse marginal. Nonischemic cardiomyopathy WHO Grp II pulmonary hypertension mPAP 30 mmhg.  LABORATORY DATA: CBC Latest Ref Rng & Units 01/23/2020 12/26/2019 09/18/2019  WBC 4.0 - 10.5 K/uL 2.4(L) 2.4(L) 2.2(L)  Hemoglobin 13.0 - 17.0 g/dL 14.2 14.4 13.7  Hematocrit 39 - 52 % 45.3 46.7 42.8  Platelets 150 - 400 K/uL 119(L) 125(L) 113(L)    CMP Latest Ref Rng & Units 06/16/2020 04/09/2020 12/28/2019  Glucose 65 - 99 mg/dL 99 82 94  BUN 8 - 27 mg/dL 12 17 11   Creatinine 0.76 - 1.27 mg/dL 1.16 1.27 1.20  Sodium 134 - 144 mmol/L 145(H) 145(H) 147(H)  Potassium 3.5 - 5.2 mmol/L 3.7 4.8 4.5  Chloride 96 - 106 mmol/L 103 104 108(H)  CO2 20 - 29 mmol/L 30(H) 27 28  Calcium 8.6 - 10.2 mg/dL 9.2 9.8 9.0  Total  Protein 6.0 - 8.5 g/dL - - 6.3  Total Bilirubin 0.0 - 1.2 mg/dL - - 0.9  Alkaline Phos 39 - 117 IU/L - - 62  AST 0 - 40 IU/L - - 12  ALT 0 - 44 IU/L - - 5    Lipid Panel     Component Value Date/Time   CHOL 124 06/16/2020 0943   TRIG 68 06/16/2020 0943   HDL 51 06/16/2020 0943   CHOLHDL 2.5 03/25/2014 0235   VLDL 12 03/25/2014 0235   LDLCALC 59 06/16/2020 0943   LABVLDL 14 06/16/2020 0943    Lab Results  Component Value Date   HGBA1C 5.8 (H) 03/26/2014   No components found for: NTPROBNP Lab Results  Component Value Date   TSH 0.84 08/10/2013   TSH 1.01 11/20/2010    Cardiac Panel (last 3 results) No results for input(s):  CKTOTAL, CKMB, TROPONINIHS, RELINDX in the last 72 hours.  IMPRESSION:    ICD-10-CM   1. Chronic HFrEF (heart failure with reduced ejection fraction) (HCC)  I50.22 metoprolol (TOPROL-XL) 200 MG 24 hr tablet    CANCELED: EKG 12-Lead  2. Nonischemic cardiomyopathy (HCC)  I42.8   3. Hypertensive heart disease with heart failure (HCC)  I11.0   4. Hypertension with heart disease  I11.9   5. Permanent atrial fibrillation (HCC)  I48.21   6. Long term (current) use of anticoagulants  Z79.01   7. Nonobstructive atherosclerosis of coronary artery  I25.10   8. Former smoker  Z87.891      RECOMMENDATIONS: Ronald [name] is a 86 y.o. male whose past medical history and cardiovascular risk factors include: dilated nonischemic cardiomyopathy, heart failure with reduced EF, permanent atrial fibrillation, nonobstructive coronary artery disease, hypertension, former smoker, advanced age.  Chronic heart failure with reduced EF, stage C, NYHA class II:  Currently on guideline directed medical therapy: Beta-blocker therapy, Entresto, Aldactone, and Lasix as needed basis.  Ejection Fraction noted on last 2D Echo  Recommend daily weight check, strict I/O's; patient lost another 1 pounds since last office visit. Continues to be in a negative balance.   Fluid  restriction to <2L per day, Na restriction < 2g per day  Most recent blood work independently reviewed the patient at today's office visit.    Patient is asked to call the office once he reaches home to reconcile his medications.    Hypertension with heart failure with reduced EF:  Continue current medical therapy.  Permanent atrial fibrillation:  CHA2DS2-VASc SCORE is 4 which correlates to 4 % risk of stroke per year.  Rate control: Beta-blocker therapy.  Thromboembolic prophylaxis: Xarelto.  Patient is requested to take Xarelto with supper.  Long-term oral anticoagulation:  Indication: Atrial fibrillation.  Patient does not endorse any evidence of bleeding.  Risks, alternatives, and benefits discussed with the patient in regards oral anticoagulation therapy.  Patient verbalizes understanding that he will seek medical attention to closest ER if he notices bleeding or sustains injury despite mechanism of action.   FINAL MEDICATION LIST END OF ENCOUNTER: Meds ordered this encounter  Medications  . metoprolol (TOPROL-XL) 200 MG 24 hr tablet    Sig: Take 1 tablet (200 mg total) by mouth in the morning.    Dispense:  90 tablet    Refill:  0    Medications Discontinued During This Encounter  Medication Reason  . dorzolamide (TRUSOPT) 2 % ophthalmic solution Patient Preference  . GOODSENSE ALL DAY ALLERGY 10 MG tablet Patient Preference  . metoprolol (TOPROL-XL) 200 MG 24 hr tablet Dose change     Current Outpatient Medications:  .  dorzolamide-timolol (COSOPT) 22.3-6.8 MG/ML ophthalmic solution, 1 drop 2 (two) times daily., Disp: , Rfl:  .  fluticasone (FLONASE) 50 MCG/ACT nasal spray, 1 spray each nostril 1-2 times daily, Disp: , Rfl:  .  latanoprost (XALATAN) 0.005 % ophthalmic solution, 1 drop at bedtime., Disp: , Rfl:  .  levobunolol (BETAGAN) 0.5 % ophthalmic solution, 1 drop every morning., Disp: , Rfl:  .  levocetirizine (XYZAL) 5 MG tablet, Take 5 mg by mouth at  bedtime., Disp: , Rfl:  .  montelukast (SINGULAIR) 10 MG tablet, Take 1 tablet by mouth daily., Disp: , Rfl:  .  Polyethyl Glyc-Propyl Glyc PF (SYSTANE HYDRATION PF) 0.4-0.3 % SOLN, Apply to eye., Disp: , Rfl:  .  potassium chloride (KLOR-CON) 10 MEQ tablet, Take 10 mEq by  mouth once., Disp: , Rfl:  .  prednisoLONE acetate (PRED FORTE) 1 % ophthalmic suspension, INSTILL ONE DROP INTO THE RIGHT EYE 4 TIMES A DAY, Disp: , Rfl:  .  rivaroxaban (XARELTO) 20 MG TABS tablet, Take 1 tablet (20 mg total) by mouth daily with supper., Disp: 90 tablet, Rfl: 3 .  sacubitril-valsartan (ENTRESTO) 97-103 MG, Take 1 tablet by mouth 2 (two) times daily., Disp: 180 tablet, Rfl: 0 .  Travoprost, BAK Free, (TRAVATAN) 0.004 % SOLN ophthalmic solution, Place 1 drop into both eyes nightly., Disp: , Rfl:  .  metoprolol (TOPROL-XL) 200 MG 24 hr tablet, Take 1 tablet (200 mg total) by mouth in the morning., Disp: 90 tablet, Rfl: 0 .  spironolactone (ALDACTONE) 25 MG tablet, Take 25 mg by mouth daily. , Disp: , Rfl:   Current Facility-Administered Medications:  .  cyanocobalamin ((VITAMIN B-12)) injection 1,000 mcg, 1,000 mcg, Intramuscular, Q30 days, Sable Feil, MD, 1,000 mcg at 12/04/12 1696  No orders of the defined types were placed in this encounter.  --Continue cardiac medications as reconciled in final medication list. --Return in about 6 months (around 12/20/2020) for heart failure management.. Or sooner if needed. --Continue follow-up with your primary care physician regarding the management of your other chronic comorbid conditions.  Patient's questions and concerns were addressed to his satisfaction. He voices understanding of the instructions provided during this encounter.   This note was created using a voice recognition software as a result there may be grammatical errors inadvertently enclosed that do not reflect the nature of this encounter. Every attempt is made to correct such errors.  Total  time spent: 20 minutes.  Rex Kras, [place], [facility]  Pager: [phone] Office: [phone]

## 2020-06-22 ENCOUNTER — Other Ambulatory Visit: Payer: Self-pay | Admitting: Cardiology

## 2020-06-22 DIAGNOSIS — I5022 Chronic systolic (congestive) heart failure: Secondary | ICD-10-CM

## 2020-07-02 ENCOUNTER — Ambulatory Visit: Payer: Medicare HMO | Admitting: Cardiology

## 2020-07-04 ENCOUNTER — Other Ambulatory Visit: Payer: Self-pay | Admitting: Cardiology

## 2020-07-04 DIAGNOSIS — I5022 Chronic systolic (congestive) heart failure: Secondary | ICD-10-CM

## 2020-07-10 ENCOUNTER — Ambulatory Visit: Payer: Medicare HMO | Admitting: Cardiology

## 2020-11-07 ENCOUNTER — Telehealth: Payer: Self-pay

## 2020-11-07 NOTE — Telephone Encounter (Signed)
  Request for cardiac clearance received via fax. However, pt does not appear to be an established patient at our office. Review of the pt's records show that he is under the care of Rex Kras, DO with [facility]. Call was placed to [facility] to inform them that surgical clearance requests should be directed to Dr. Brennan Bailey office. Receptionist verbalizes understanding.

## 2020-12-16 ENCOUNTER — Other Ambulatory Visit: Payer: Self-pay | Admitting: Cardiology

## 2020-12-16 DIAGNOSIS — I5022 Chronic systolic (congestive) heart failure: Secondary | ICD-10-CM

## 2020-12-22 ENCOUNTER — Ambulatory Visit: Payer: Medicare HMO | Admitting: Cardiology

## 2020-12-23 ENCOUNTER — Telehealth: Payer: Self-pay

## 2020-12-23 NOTE — Telephone Encounter (Signed)
Ronald [name] is not a patient of [facility]. He follows with Dr. Terri Skains of [facility] (phone [phone]). Cardiac clearance should be routed to that office.   Called [employer] and made them aware.   Loel Dubonnet, NP

## 2020-12-23 NOTE — Telephone Encounter (Signed)
   [facility] Pre-operative Risk Assessment    [facility] STAFF: - Please ensure there is not already an duplicate clearance open for this procedure. - Under Visit Info/Reason for Call, type in Other and utilize the format Clearance MM/DD/YY or Clearance TBD. Do not use dashes or single digits. - If request is for dental extraction, please clarify the # of teeth to be extracted.  Request for surgical clearance:  1. What type of surgery is being performed? Xen OD   2. When is this surgery scheduled? 12/25/2020   3. What type of clearance is required (medical clearance vs. Pharmacy clearance to hold med vs. Both)? BOTH  4. Are there any medications that need to be held prior to surgery and how long? Aspirin and how long is safe.   5. Practice name and name of physician performing surgery? [facility]   6. What is the office phone number? [phone]   7.   What is the office fax number? [phone]  8.   Anesthesia type (None, local, MAC, general) ? MAC   Ronald [name] 12/23/2020, 9:52 AM  _________________________________________________________________   (provider comments below)

## 2021-01-01 ENCOUNTER — Encounter: Payer: Self-pay | Admitting: Cardiology

## 2021-01-01 ENCOUNTER — Ambulatory Visit: Payer: Medicare Other | Admitting: Cardiology

## 2021-01-01 ENCOUNTER — Other Ambulatory Visit: Payer: Self-pay

## 2021-01-01 VITALS — BP 165/96 | HR 59 | Temp 98.4°F | Resp 16 | Ht 71.0 in | Wt 187.0 lb

## 2021-01-01 DIAGNOSIS — I110 Hypertensive heart disease with heart failure: Secondary | ICD-10-CM

## 2021-01-01 DIAGNOSIS — I428 Other cardiomyopathies: Secondary | ICD-10-CM

## 2021-01-01 DIAGNOSIS — I5022 Chronic systolic (congestive) heart failure: Secondary | ICD-10-CM

## 2021-01-01 DIAGNOSIS — Z7901 Long term (current) use of anticoagulants: Secondary | ICD-10-CM

## 2021-01-01 DIAGNOSIS — I4821 Permanent atrial fibrillation: Secondary | ICD-10-CM

## 2021-01-01 DIAGNOSIS — I2510 Atherosclerotic heart disease of native coronary artery without angina pectoris: Secondary | ICD-10-CM

## 2021-01-01 DIAGNOSIS — I119 Hypertensive heart disease without heart failure: Secondary | ICD-10-CM

## 2021-01-01 MED ORDER — SPIRONOLACTONE 25 MG PO TABS: 25.0000 mg | ORAL_TABLET | Freq: Every morning | ORAL | 0 refills | Status: DC

## 2021-01-01 MED ORDER — METOPROLOL SUCCINATE ER 200 MG PO TB24: 200.0000 mg | ORAL_TABLET | Freq: Every day | ORAL | 0 refills | Status: DC

## 2021-01-01 NOTE — Progress Notes (Signed)
Ronald [name] Date of Birth: 1934-10-19 MRN: [id number] Primary Care Provider:Kaplan, Baldemar Friday., PA-C Former Cardiology Providers: Dr. Vear Clock Primary Cardiologist: Rex Kras, DO, [facility] (established care 12/31/2019)  Date: 01/01/21 Last Office Visit: 06/19/2020.  Chief Complaint  Patient presents with  . Chronic HFrEF (heart failure with reduced ejection fraction  . Follow-up    6 month    HPI  Ronald [name] is a 87 y.o.  male who presents to the office with a chief complaint of " 26-month follow-up for management of heart failure and atrial fibrillation." Patient's past medical history and cardiovascular risk factors include: dilated nonischemic cardiomyopathy, heart failure with reduced EF, permanent atrial fibrillation, nonobstructive coronary artery disease, hypertension, former smoker, advanced age.  Patient is being followed by our practice for the management of his underlying congestive heart failure and atrial fibrillation.  Since last office visit patient has lost his wife and has had a difficult time adjusting to day-to-day activities.  He presents for 25-month follow-up and preoperative risk stratification.  Since last office visit patient has not been hospitalized for congestive heart failure or cardiovascular symptoms.  He has gained approximately 8 pounds since last office visit and this is most likely secondary to both fluid retention as well as dietary indiscretion.  Patient is also stopped taking his diuretic medications due to frequent urination.  Medications reconciled during today's encounter.  Atrial fibrillation: Patient has a known history of permanent atrial fibrillation prior to establishing care with myself.  He has been on beta-blockers for rate control strategy and Xarelto for thromboembolic prophylaxis.  Patient did not endorse any evidence of bleeding.  Patient states that he has an upcoming procedure for his underlying glaucoma and needs assistant  managing his pharmacological therapy.  ALLERGIES: Allergies  Allergen Reactions  . Lactose Intolerance (Gi) Other (See Comments)    GI upset     MEDICATION LIST PRIOR TO VISIT: Current Outpatient Medications on File Prior to Visit  Medication Sig Dispense Refill  . Chlorpheniramine Maleate (WAL-FINATE PO) Take by mouth.    . cyanocobalamin 2000 MCG tablet Take 2,000 mcg by mouth daily.    . dorzolamide-timolol (COSOPT) 22.3-6.8 MG/ML ophthalmic solution 1 drop 2 (two) times daily.    . fluticasone (FLONASE) 50 MCG/ACT nasal spray 1 spray each nostril 1-2 times daily    . latanoprost (XALATAN) 0.005 % ophthalmic solution 1 drop at bedtime.    [name] levobunolol (BETAGAN) 0.5 % ophthalmic solution 1 drop every morning.    . montelukast (SINGULAIR) 10 MG tablet Take 1 tablet by mouth daily.    Vladimir Faster Glyc-Propyl Glyc PF (SYSTANE HYDRATION PF) 0.4-0.3 % SOLN Apply to eye.    . prednisoLONE acetate (PRED FORTE) 1 % ophthalmic suspension INSTILL ONE DROP INTO THE RIGHT EYE 4 TIMES A DAY    . rivaroxaban (XARELTO) 20 MG TABS tablet Take 1 tablet (20 mg total) by mouth daily with supper. 90 tablet 3  . sacubitril-valsartan (ENTRESTO) 97-103 MG Take 1 tablet by mouth 2 (two) times daily.    . Travoprost, BAK Free, (TRAVATAN) 0.004 % SOLN ophthalmic solution Place 1 drop into both eyes nightly.    . levocetirizine (XYZAL) 5 MG tablet Take 5 mg by mouth at bedtime. (Patient not taking: Reported on 01/01/2021)     Current Facility-Administered Medications on File Prior to Visit  Medication Dose Route Frequency Provider Last Rate Last Admin  . cyanocobalamin ((VITAMIN B-12)) injection 1,000 mcg  1,000 mcg Intramuscular Q30 days Sharlett Iles,  Loralee Pacas, MD   1,000 mcg at 12/04/12 7510    PAST MEDICAL HISTORY: Past Medical History:  Diagnosis Date  . Allergic rhinitis, cause unspecified   . Atrial fibrillation ([place])   . BPH (benign prostatic hypertrophy)   . CHF (congestive heart failure) ([place])   .  Essential hypertension, benign   . GERD (gastroesophageal reflux disease)   . Glaucoma   . Hepatic cyst   . Hiatal hernia 2014  . History of gout   . Hyperlipidemia   . Irritable bowel syndrome   . Lactose intolerance   . Personal history of colonic polyps 03/04/1992   adenomatous polyp  . Pneumonia    "~ 5 times in my lifetime; last time was 03/2014" (10/14/2017)  . Reflux esophagitis   . Schatzki's ring 2014  . Sleep apnea   . Vitamin B12 deficiency     PAST SURGICAL HISTORY: Past Surgical History:  Procedure Laterality Date  . CATARACT EXTRACTION W/ INTRAOCULAR LENS IMPLANT Left   . HYDROCELE EXCISION Bilateral 10/2016   Archie Endo 10/11/2016  . INTRAVASCULAR PRESSURE WIRE/FFR STUDY N/A 10/17/2017   Procedure: INTRAVASCULAR PRESSURE WIRE/FFR STUDY;  Surgeon: Nigel Mormon, MD;  Location: [facility];  Service: Cardiovascular;  Laterality: N/A;  . NASAL RECONSTRUCTION     "tried to fix my nose so I can smell; still can't smell" (10/14/2017)  . RIGHT/LEFT HEART CATH AND CORONARY ANGIOGRAPHY N/A 10/17/2017   Procedure: RIGHT/LEFT HEART CATH AND CORONARY ANGIOGRAPHY;  Surgeon: Nigel Mormon, MD;  Location: [facility];  Service: Cardiovascular;  Laterality: N/A;  . TRANSURETHRAL RESECTION OF PROSTATE  10/2007   Archie Endo 08/22/2007  . ULTRASOUND GUIDANCE FOR VASCULAR ACCESS  10/17/2017   Procedure: Ultrasound Guidance For Vascular Access;  Surgeon: Nigel Mormon, MD;  Location: [place];  Service: Cardiovascular;;    FAMILY HISTORY: The patient's family history includes Heart disease in his sister.   SOCIAL HISTORY:  The patient  reports that he quit smoking about 37 years ago. His smoking use included cigarettes. He has a 30.00 pack-year smoking history. He has never used smokeless tobacco. He reports that he does not drink alcohol and does not use drugs.  Review of Systems  Constitutional: Negative for chills and fever.  HENT:  Negative for hoarse voice and nosebleeds.   Eyes: Negative for discharge, double vision and pain.  Cardiovascular: Negative for chest pain, claudication, dyspnea on exertion, leg swelling, near-syncope, orthopnea, palpitations, paroxysmal nocturnal dyspnea and syncope.  Respiratory: Negative for hemoptysis and shortness of breath.   Musculoskeletal: Negative for muscle cramps and myalgias.  Gastrointestinal: Negative for abdominal pain, constipation, diarrhea, hematemesis, hematochezia, melena, nausea and vomiting.  Neurological: Negative for dizziness and light-headedness.   PHYSICAL EXAM: Vitals with BMI 01/01/2021 01/01/2021 06/19/2020  Height - 5\' 11"  5\' 11"   Weight - 187 lbs 179 lbs  BMI - 25.85 27.78  Systolic 242 353 614  Diastolic 96 431 78  Pulse 59 54 65    CONSTITUTIONAL: Well-developed and well-nourished. No acute distress.  SKIN: Skin is warm and dry. No rash noted. No cyanosis. No pallor. No jaundice HEAD: Normocephalic and atraumatic.  EYES: No scleral icterus MOUTH/THROAT: Moist oral membranes.  NECK: No JVD present. No thyromegaly noted. No carotid bruits  LYMPHATIC: No visible cervical adenopathy.  CHEST Normal respiratory effort. No intercostal retractions  LUNGS: Clear to auscultation bilaterally.  No stridor. No wheezes. No rales.  CARDIOVASCULAR: Irregularly irregular, soft systolic ejection murmur heard at  the right intercostal space, a 3 out of 6 holosystolic murmur heard at the apex, no gallops or rubs. ABDOMINAL: No apparent ascites.  EXTREMITIES: No peripheral edema  HEMATOLOGIC: No significant bruising NEUROLOGIC: Oriented to person, place, and time. Nonfocal. Normal muscle tone.  PSYCHIATRIC: Normal mood and affect. Normal behavior. Cooperative  CARDIAC DATABASE: EKG: 01/01/2021: Atrial fibrillation, 64 bpm, LVH per voltage criteria, ST-T changes most likely secondary to repolarization.    Echocardiogram: 11/07/2018: LVEF 35-40%, calculated LVEF 41%, mild  LVH, biatrial dilation, RV mildly dilated, RV function low normal, mild MR, moderate to severe TR.  Stress Testing:  Lexiscan myoview stress test 08/08/2017: 1. Low risk of hemodynamically significant coronary artery disease. Prognostically, this is a high risk study. The LV is dilated both in rest and stress images with severe global hypokinesis. 2. Rest and stress EKG show A. FIb and occasional PVC. Non diagnostic due to pharmacologic stress. 3. Fixed perfusion defect due to diaphragmatic attenuation. 4. The gated study showed the ejection fraction was <10%.  Heart Catheterization: RHC/LHC 10/17/2017: Nonobstructive coronary artery disease- 40% stenosis mid LAD, 60% second diagonal, 50% stenosis of first obtuse marginal. Nonischemic cardiomyopathy WHO Grp II pulmonary hypertension mPAP 30 mmhg.  LABORATORY DATA: CBC Latest Ref Rng & Units 01/23/2020 12/26/2019 09/18/2019  WBC 4.0 - 10.5 K/uL 2.4(L) 2.4(L) 2.2(L)  Hemoglobin 13.0 - 17.0 g/dL 14.2 14.4 13.7  Hematocrit 39.0 - 52.0 % 45.3 46.7 42.8  Platelets 150 - 400 K/uL 119(L) 125(L) 113(L)    CMP Latest Ref Rng & Units 06/16/2020 04/09/2020 12/28/2019  Glucose 65 - 99 mg/dL 99 82 94  BUN 8 - 27 mg/dL 12 17 11   Creatinine 0.76 - 1.27 mg/dL 1.16 1.27 1.20  Sodium 134 - 144 mmol/L 145(H) 145(H) 147(H)  Potassium 3.5 - 5.2 mmol/L 3.7 4.8 4.5  Chloride 96 - 106 mmol/L 103 104 108(H)  CO2 20 - 29 mmol/L 30(H) 27 28  Calcium 8.6 - 10.2 mg/dL 9.2 9.8 9.0  Total Protein 6.0 - 8.5 g/dL - - 6.3  Total Bilirubin 0.0 - 1.2 mg/dL - - 0.9  Alkaline Phos 39 - 117 IU/L - - 62  AST 0 - 40 IU/L - - 12  ALT 0 - 44 IU/L - - 5    Lipid Panel     Component Value Date/Time   CHOL 124 06/16/2020 0943   TRIG 68 06/16/2020 0943   HDL 51 06/16/2020 0943   CHOLHDL 2.5 03/25/2014 0235   VLDL 12 03/25/2014 0235   LDLCALC 59 06/16/2020 0943   LABVLDL 14 06/16/2020 0943    Lab Results  Component Value Date   HGBA1C 5.8 (H) 03/26/2014   No components  found for: NTPROBNP Lab Results  Component Value Date   TSH 0.84 08/10/2013   TSH 1.01 11/20/2010    Cardiac Panel (last 3 results) No results for input(s): CKTOTAL, CKMB, TROPONINIHS, RELINDX in the last 72 hours.  IMPRESSION:    ICD-10-CM   1. Chronic HFrEF (heart failure with reduced ejection fraction) (HCC)  I50.22 EKG 12-Lead    metoprolol (TOPROL XL) 200 MG 24 hr tablet    spironolactone (ALDACTONE) 25 MG tablet    Pro b natriuretic peptide (BNP)    Basic metabolic panel    Magnesium  2. Nonischemic cardiomyopathy (HCC)  I42.8   3. Hypertensive heart disease with heart failure (HCC)  I11.0   4. Hypertension with heart disease  I11.9   5. Permanent atrial fibrillation (HCC)  I48.21   6.  Nonobstructive atherosclerosis of coronary artery  I25.10   7. Long term (current) use of anticoagulants  Z79.01      RECOMMENDATIONS: Ronald [name] is a 87 y.o. male whose past medical history and cardiovascular risk factors include: dilated nonischemic cardiomyopathy, heart failure with reduced EF, permanent atrial fibrillation, nonobstructive coronary artery disease, hypertension, former smoker, advanced age.  Chronic heart failure with reduced EF, stage C, NYHA class II:  Medications reconciled.  Patient has gained approximately 8 pounds since last office visit this is most likely secondary to fluid retention as well as dietary indiscretion.  Since he has lost his wife it has been difficult eating healthy/low-salt diet.  Patient's blood pressure is also not well controlled at today's office visit.  Recommended initiating spironolactone 25 mg p.o. daily.  Blood work in 1 week to evaluate kidney function and electrolytes.  Patient has Lasix 20 mg tablets which he stopped due to increased urinary frequency which may have also contributed to the above.  I have asked him to hold Lasix for now and may use it as needed if he starts noticing his weight to trend up by more than 1 pound  over 24 hours or more than 3 pounds over a week.  Fluid restriction to <2L per day, Na restriction < 2g per day  Most recent blood work independently reviewed the patient at today's office visit.      Hypertension with heart failure with reduced EF:  Continue current medical therapy.  Permanent atrial fibrillation:  CHA2DS2-VASc SCORE is 4 which correlates to 4 % risk of stroke per year.  Rate control: Beta-blocker therapy.  Thromboembolic prophylaxis: Xarelto.  Patient is requested to take Xarelto with supper.  Long-term oral anticoagulation:  Indication: Atrial fibrillation.  Patient does not endorse any evidence of bleeding.  Risks, alternatives, and benefits discussed with the patient in regards oral anticoagulation therapy.  Patient verbalizes understanding that he will seek medical attention to closest ER if he notices bleeding or sustains injury despite mechanism of action.  We will reach out to Dr. Lamont Dowdy office to discuss anticoagulation management before and after the upcoming noncardiac surgery.  FINAL MEDICATION LIST END OF ENCOUNTER: Meds ordered this encounter  Medications  . metoprolol (TOPROL XL) 200 MG 24 hr tablet    Sig: Take 1 tablet (200 mg total) by mouth daily. Hold if systolic blood pressure (top blood pressure number) less than 100 mmHg or heart rate less than 60 bpm (pulse).    Dispense:  90 tablet    Refill:  0  . spironolactone (ALDACTONE) 25 MG tablet    Sig: Take 1 tablet (25 mg total) by mouth every morning.    Dispense:  90 tablet    Refill:  0     Current Outpatient Medications:  .  Chlorpheniramine Maleate (WAL-FINATE PO), Take by mouth., Disp: , Rfl:  .  cyanocobalamin 2000 MCG tablet, Take 2,000 mcg by mouth daily., Disp: , Rfl:  .  dorzolamide-timolol (COSOPT) 22.3-6.8 MG/ML ophthalmic solution, 1 drop 2 (two) times daily., Disp: , Rfl:  .  fluticasone (FLONASE) 50 MCG/ACT nasal spray, 1 spray each nostril 1-2 times daily, Disp: ,  Rfl:  .  latanoprost (XALATAN) 0.005 % ophthalmic solution, 1 drop at bedtime., Disp: , Rfl:  .  levobunolol (BETAGAN) 0.5 % ophthalmic solution, 1 drop every morning., Disp: , Rfl:  .  metoprolol (TOPROL XL) 200 MG 24 hr tablet, Take 1 tablet (200 mg total) by mouth daily. Hold if systolic  blood pressure (top blood pressure number) less than 100 mmHg or heart rate less than 60 bpm (pulse)., Disp: 90 tablet, Rfl: 0 .  montelukast (SINGULAIR) 10 MG tablet, Take 1 tablet by mouth daily., Disp: , Rfl:  .  Polyethyl Glyc-Propyl Glyc PF (SYSTANE HYDRATION PF) 0.4-0.3 % SOLN, Apply to eye., Disp: , Rfl:  .  prednisoLONE acetate (PRED FORTE) 1 % ophthalmic suspension, INSTILL ONE DROP INTO THE RIGHT EYE 4 TIMES A DAY, Disp: , Rfl:  .  rivaroxaban (XARELTO) 20 MG TABS tablet, Take 1 tablet (20 mg total) by mouth daily with supper., Disp: 90 tablet, Rfl: 3 .  sacubitril-valsartan (ENTRESTO) 97-103 MG, Take 1 tablet by mouth 2 (two) times daily., Disp: , Rfl:  .  Travoprost, BAK Free, (TRAVATAN) 0.004 % SOLN ophthalmic solution, Place 1 drop into both eyes nightly., Disp: , Rfl:  .  levocetirizine (XYZAL) 5 MG tablet, Take 5 mg by mouth at bedtime. (Patient not taking: Reported on 01/01/2021), Disp: , Rfl:  .  spironolactone (ALDACTONE) 25 MG tablet, Take 1 tablet (25 mg total) by mouth every morning., Disp: 90 tablet, Rfl: 0  Current Facility-Administered Medications:  .  cyanocobalamin ((VITAMIN B-12)) injection 1,000 mcg, 1,000 mcg, Intramuscular, Q30 days, Sable Feil, MD, 1,000 mcg at 12/04/12 4982  Orders Placed This Encounter  Procedures  . Pro b natriuretic peptide (BNP)  . Basic metabolic panel  . Magnesium  . EKG 12-Lead   --Continue cardiac medications as reconciled in final medication list. --Return in about 3 months (around 04/03/2021) for Follow up, heart failure management.. Or sooner if needed. --Continue follow-up with your primary care physician regarding the management of your  other chronic comorbid conditions.  Patient's questions and concerns were addressed to his satisfaction. He voices understanding of the instructions provided during this encounter.   This note was created using a voice recognition software as a result there may be grammatical errors inadvertently enclosed that do not reflect the nature of this encounter. Every attempt is made to correct such errors.  Total time spent: 40 minutes.  Rex Kras, [place], [facility]  Pager: [phone] Office: [phone]

## 2021-01-05 ENCOUNTER — Other Ambulatory Visit: Payer: Self-pay

## 2021-01-05 DIAGNOSIS — I5022 Chronic systolic (congestive) heart failure: Secondary | ICD-10-CM

## 2021-01-06 ENCOUNTER — Other Ambulatory Visit: Payer: Self-pay

## 2021-01-06 ENCOUNTER — Telehealth: Payer: Self-pay

## 2021-01-06 NOTE — Telephone Encounter (Signed)
But he is on oral anticoagulation. Do they recommend that he hold this for the surgery?

## 2021-01-06 NOTE — Telephone Encounter (Signed)
Hi I called [facility] and they said patient is having a xen surgery on April 7 @ 1:00 pm. I told them to make a side note patient is not on aspirin

## 2021-01-07 NOTE — Telephone Encounter (Signed)
Haven't been able to contact patient to let him know he needs to hold xarelto 2 days prior from his surgery and hold it on the day of the surgery as well

## 2021-01-10 ENCOUNTER — Other Ambulatory Visit: Payer: Self-pay | Admitting: Cardiology

## 2021-01-10 DIAGNOSIS — Z7901 Long term (current) use of anticoagulants: Secondary | ICD-10-CM

## 2021-01-10 DIAGNOSIS — I4821 Permanent atrial fibrillation: Secondary | ICD-10-CM

## 2021-01-12 ENCOUNTER — Other Ambulatory Visit: Payer: Self-pay | Admitting: Cardiology

## 2021-01-12 DIAGNOSIS — I4821 Permanent atrial fibrillation: Secondary | ICD-10-CM

## 2021-01-12 DIAGNOSIS — Z7901 Long term (current) use of anticoagulants: Secondary | ICD-10-CM

## 2021-01-13 LAB — BASIC METABOLIC PANEL
BUN/Creatinine Ratio: 12 (ref 10–24)
BUN: 15 mg/dL (ref 8–27)
CO2: 28 mmol/L (ref 20–29)
Calcium: 9.5 mg/dL (ref 8.6–10.2)
Chloride: 102 mmol/L (ref 96–106)
Creatinine, Ser: 1.22 mg/dL (ref 0.76–1.27)
Glucose: 97 mg/dL (ref 65–99)
Potassium: 3.6 mmol/L (ref 3.5–5.2)
Sodium: 145 mmol/L — ABNORMAL HIGH (ref 134–144)
eGFR: 58 mL/min/{1.73_m2} — ABNORMAL LOW (ref >59)

## 2021-01-13 LAB — MAGNESIUM: Magnesium: 2.2 mg/dL (ref 1.6–2.3)

## 2021-01-13 LAB — PRO B NATRIURETIC PEPTIDE: NT-Pro BNP: 2303 pg/mL — ABNORMAL HIGH (ref 0–486)

## 2021-01-20 ENCOUNTER — Other Ambulatory Visit: Payer: Self-pay | Admitting: Cardiology

## 2021-01-20 DIAGNOSIS — I5022 Chronic systolic (congestive) heart failure: Secondary | ICD-10-CM

## 2021-01-21 NOTE — Progress Notes (Signed)
Called pt to inform him about the message above. Pt understood

## 2021-02-03 NOTE — Telephone Encounter (Signed)
I called patient to let him know about Xarelto yesterday to make sure not to take it for his surgery patient didn't voiced understanding I went over his cardiac meds and tried to spell them out to make sure he understood. Honestly patient seem confused about what he is taking patients provider was made aware

## 2021-02-09 ENCOUNTER — Encounter (HOSPITAL_COMMUNITY): Payer: Self-pay | Admitting: Emergency Medicine

## 2021-02-09 ENCOUNTER — Emergency Department (HOSPITAL_COMMUNITY)
Admission: EM | Admit: 2021-02-09 | Discharge: 2021-02-10 | Disposition: A | Discharge status: Home / Self Care | Payer: Medicare Other | Attending: Emergency Medicine | Admitting: Emergency Medicine

## 2021-02-09 ENCOUNTER — Other Ambulatory Visit: Payer: Self-pay

## 2021-02-09 DIAGNOSIS — R600 Localized edema: Secondary | ICD-10-CM | POA: Diagnosis not present

## 2021-02-09 DIAGNOSIS — Z87891 Personal history of nicotine dependence: Secondary | ICD-10-CM | POA: Diagnosis not present

## 2021-02-09 DIAGNOSIS — I5032 Chronic diastolic (congestive) heart failure: Secondary | ICD-10-CM | POA: Diagnosis not present

## 2021-02-09 DIAGNOSIS — I110 Hypertensive heart disease with heart failure: Secondary | ICD-10-CM | POA: Insufficient documentation

## 2021-02-09 DIAGNOSIS — R440 Auditory hallucinations: Secondary | ICD-10-CM | POA: Diagnosis not present

## 2021-02-09 DIAGNOSIS — Z7901 Long term (current) use of anticoagulants: Secondary | ICD-10-CM | POA: Insufficient documentation

## 2021-02-09 DIAGNOSIS — T443X1A Poisoning by other parasympatholytics [anticholinergics and antimuscarinics] and spasmolytics, accidental (unintentional), initial encounter: Secondary | ICD-10-CM | POA: Insufficient documentation

## 2021-02-09 DIAGNOSIS — R441 Visual hallucinations: Secondary | ICD-10-CM | POA: Diagnosis not present

## 2021-02-09 DIAGNOSIS — X58XXXA Exposure to other specified factors, initial encounter: Secondary | ICD-10-CM | POA: Diagnosis not present

## 2021-02-09 DIAGNOSIS — R443 Hallucinations, unspecified: Secondary | ICD-10-CM

## 2021-02-09 NOTE — ED Triage Notes (Signed)
Patient is from home, started Atropine drops from opthalmologist.  Patient had eye surgery yesterday.  Right eye is red, no drainage, but patient is having visual hallucinations.  Patient is CAOx4.

## 2021-02-09 NOTE — ED Provider Notes (Signed)
[facility] EMERGENCY DEPARTMENT Provider Note   CSN: [id number] Arrival date & time: 02/09/21  2057     History Chief Complaint  Patient presents with  . Hallucinations    Ronald [name] is a 87 y.o. male presenting for evaluation of hallucinations that began this morning.  States he feels his baseline, however began seeing things and people that are not there, such as his late life.  Patient's hallucinations are both auditory and visual.  He also has been having some odd dreams.  He is unsure if he is taking his eyedrops correctly.  He was started on atropine on Friday, supposed to be taking 1 drop daily though patient's daughter and patient both are unsure if he is only taking this much.  He denies any urinary symptoms, chest pain, abdominal pain, nausea, vomiting, fevers, headache, recent falls or head injury.  No other new medications.  Denies difficulty urinating, skin changes, diaphoresis, new vision changes.  His daughter is also at bedside provides additional history.  States he has been acting completely his baseline with normal behavior and mentation, however has just been complaining of these hallucinations.  Patient had surgery on right eye yesterday by Dr. Midge Aver.  The history is provided by the patient and a relative.       Past Medical History:  Diagnosis Date  . Allergic rhinitis, cause unspecified   . Atrial fibrillation ([place])   . BPH (benign prostatic hypertrophy)   . CHF (congestive heart failure) ([place])   . Essential hypertension, benign   . GERD (gastroesophageal reflux disease)   . Glaucoma   . Hepatic cyst   . Hiatal hernia 2014  . History of gout   . Hyperlipidemia   . Irritable bowel syndrome   . Lactose intolerance   . Personal history of colonic polyps 03/04/1992   adenomatous polyp  . Pneumonia    "~ 5 times in my lifetime; last time was 03/2014" (10/14/2017)  . Reflux esophagitis   . Schatzki's ring 2014  . Sleep  apnea   . Vitamin B12 deficiency     Patient Active Problem List   Diagnosis Date Noted  . Neutropenia ([place]) 09/24/2019  . Thrombocytopenia ([place]) 09/03/2019  . Nonischemic cardiomyopathy ([place]) 06/08/2019  . Nonobstructive atherosclerosis of coronary artery 06/08/2019  . Right upper quadrant abdominal pain 02/09/2017  . Other hydrocele 09/03/2016  . Itching with irritation 03/08/2016  . Hypertension 12/04/2015  . Idiopathic chronic gout of right ankle without tophus 12/04/2015  . Pure hypercholesterolemia 12/04/2015  . Venous stasis 12/04/2015  . Chronic systolic heart failure ([place]) 03/26/2014  . Community acquired pneumonia 03/24/2014  . Acute diastolic CHF (congestive heart failure) ([place]) 03/24/2014  . Permanent atrial fibrillation ([place]) 03/24/2014  . Bloating 01/16/2014  . Weight gain 01/16/2014  . Nocturia 09/12/2013  . Benign localized hyperplasia of prostate without urinary obstruction and other lower urinary tract symptoms (LUTS) 07/12/2012  . IBS (irritable bowel syndrome) 01/27/2012  . GERD (gastroesophageal reflux disease) 01/27/2012  . B12 deficiency 01/27/2012  . VITAMIN B12 DEFICIENCY 11/23/2010  . COLONIC POLYPS, HX OF 11/20/2010  . HYPERLIPIDEMIA 11/19/2010  . Hypertensive heart disease with heart failure ([place]) 11/19/2010  . ALLERGIC RHINITIS 11/19/2010  . REFLUX ESOPHAGITIS 11/19/2010  . IRRITABLE BOWEL SYNDROME 11/19/2010    Past Surgical History:  Procedure Laterality Date  . CATARACT EXTRACTION W/ INTRAOCULAR LENS IMPLANT Left   . HYDROCELE EXCISION Bilateral 10/2016   Archie Endo 10/11/2016  . INTRAVASCULAR PRESSURE WIRE/FFR STUDY  N/A 10/17/2017   Procedure: INTRAVASCULAR PRESSURE WIRE/FFR STUDY;  Surgeon: Nigel Mormon, MD;  Location: [place];  Service: Cardiovascular;  Laterality: N/A;  . NASAL RECONSTRUCTION     "tried to fix my nose so I can smell; still can't smell" (10/14/2017)  . RIGHT/LEFT HEART CATH AND CORONARY ANGIOGRAPHY N/A  10/17/2017   Procedure: RIGHT/LEFT HEART CATH AND CORONARY ANGIOGRAPHY;  Surgeon: Nigel Mormon, MD;  Location: [place] [facility];  Service: Cardiovascular;  Laterality: N/A;  . TRANSURETHRAL RESECTION OF PROSTATE  10/2007   Archie Endo 08/22/2007  . ULTRASOUND GUIDANCE FOR VASCULAR ACCESS  10/17/2017   Procedure: Ultrasound Guidance For Vascular Access;  Surgeon: Nigel Mormon, MD;  Location: [place] [facility];  Service: Cardiovascular;;       Family History  Problem Relation Age of Onset  . Heart disease Sister   . Colon cancer Neg Hx   . Esophageal cancer Neg Hx   . Diabetes Neg Hx     Social History   Tobacco Use  . Smoking status: Former Smoker    Packs/day: 1.00    Years: 30.00    Pack years: 30.00    Types: Cigarettes    Quit date: 11/02/1983    Years since quitting: 37.2  . Smokeless tobacco: Never Used  Vaping Use  . Vaping Use: Never used  Substance Use Topics  . Alcohol use: No  . Drug use: No    Home Medications Prior to Admission medications   Medication Sig Start Date End Date Taking? Authorizing Provider  Chlorpheniramine Maleate (WAL-FINATE PO) Take by mouth.    [provider]  cyanocobalamin 2000 MCG tablet Take 2,000 mcg by mouth daily.    [provider]  dorzolamide-timolol (COSOPT) 22.3-6.8 MG/ML ophthalmic solution 1 drop 2 (two) times daily. 11/06/19   [provider]  fluticasone (FLONASE) 50 MCG/ACT nasal spray 1 spray each nostril 1-2 times daily 01/13/18   [provider]  latanoprost (XALATAN) 0.005 % ophthalmic solution 1 drop at bedtime. 11/06/19   [provider]  levobunolol (BETAGAN) 0.5 % ophthalmic solution 1 drop every morning. 10/22/19   [provider]  levocetirizine (XYZAL) 5 MG tablet Take 5 mg by mouth at bedtime. Patient not taking: Reported on 01/01/2021 08/17/19   [provider]  metoprolol (TOPROL XL) 200 MG 24 hr tablet Take 1 tablet (200 mg total) by mouth  daily. Hold if systolic blood pressure (top blood pressure number) less than 100 mmHg or heart rate less than 60 bpm (pulse). 01/01/21 04/01/21  Tolia, Sunit, DO  montelukast (SINGULAIR) 10 MG tablet Take 1 tablet by mouth daily. 04/28/20   [provider]  Polyethyl Glyc-Propyl Glyc PF (SYSTANE HYDRATION PF) 0.4-0.3 % SOLN Apply to eye.    [provider]  prednisoLONE acetate (PRED FORTE) 1 % ophthalmic suspension INSTILL ONE DROP INTO THE RIGHT EYE 4 TIMES A DAY 01/23/19   [provider]  sacubitril-valsartan (ENTRESTO) 97-103 MG Take 1 tablet by mouth 2 (two) times daily.    [provider]  spironolactone (ALDACTONE) 25 MG tablet Take 1 tablet (25 mg total) by mouth every morning. 01/01/21 04/01/21  Tolia, Sunit, DO  Travoprost, BAK Free, (TRAVATAN) 0.004 % SOLN ophthalmic solution Place 1 drop into both eyes nightly.    [provider]  XARELTO 20 MG TABS tablet TAKE 1 TABLET BY MOUTH DAILY WITH SUPPER. 01/13/21   Tolia, Sunit, DO    Allergies    Lactose  intolerance (gi)  Review of Systems   Review of Systems  Psychiatric/Behavioral: Positive for hallucinations. Negative for agitation, behavioral problems and confusion.  All other systems reviewed and are negative.   Physical Exam Updated Vital Signs BP (!) 191/131   Pulse (!) 55   Temp 99.1 F (37.3 C) (Oral)   Resp 18   SpO2 94%   Physical Exam Vitals and nursing note reviewed.  Constitutional:      Appearance: He is well-developed.  HENT:     Head: Normocephalic and atraumatic.  Eyes:     Comments: Right conjunctival injection  Cardiovascular:     Rate and Rhythm: Normal rate. Rhythm irregular.  Pulmonary:     Effort: Pulmonary effort is normal. No respiratory distress.     Breath sounds: Normal breath sounds.  Abdominal:     General: Bowel sounds are normal.     Palpations: Abdomen is soft.     Tenderness: There is no abdominal tenderness.  Musculoskeletal:     Right lower  leg: Edema present.     Left lower leg: Edema present.  Skin:    General: Skin is warm.  Neurological:     Mental Status: He is alert and oriented to person, place, and time.  Psychiatric:        Attention and Perception: Attention normal.        Mood and Affect: Mood and affect normal.        Speech: Speech normal.        Behavior: Behavior normal.        Thought Content: Thought content normal.     ED Results / Procedures / Treatments   Labs (all labs ordered are listed, but only abnormal results are displayed) Labs Reviewed - No data to display  EKG None  Radiology No results found.  Procedures Procedures   Medications Ordered in ED Medications - No data to display  ED Course  I have reviewed the triage vital signs and the nursing notes.  Pertinent labs & imaging results that were available during my care of the patient were reviewed by me and considered in my medical decision making (see chart for details).    MDM Rules/Calculators/A&P                          Patient presenting for new onset of auditory and visual hallucinations today without any other accompanying symptoms of infection, altered mental status, head injury, or other psychiatric features.  He did just however undergo eye surgery by Dr. Katy Fitch yesterday and was started on atropine drops to the right eye on Friday.  However patient and his daughter are both unsure if he is been dosing properly, prescribed as 1 drop once daily.  Symptoms have begun to improve throughout ED stay.  His daughter assures he is at his mental baseline has been acting his normal throughout the day.  He is very appropriate on examination, alert and oriented x4.  EKG with atrial fibrillation, this is chronic.  No other symptoms of anticholinergic toxicity though presentation seems most consistent with this.  Recommend he discontinue the atropine drops and call his ophthalmologist first thing in the morning to discuss this and discuss  possible treatment change.  Patient was discussed with and evaluated by attending physician Dr. Zenia Resides, who does not believe any additional work-up is indicated at this time and that patient is appropriate for discharge to home with discontinuation of atropine drops.  He is instructed to return should symptoms worsen, or he develop any new accompanying symptoms.  Discharged in no distress.  Final Clinical Impression(s) / ED Diagnoses Final diagnoses:  Hallucinations  Anticholinergic drug overdose, accidental or unintentional, initial encounter    Rx / DC Orders ED Discharge Orders    None       Griselda Tosh, [place] N, PA-C 02/09/21 2346    Lacretia Leigh, MD 02/12/21 1116

## 2021-02-09 NOTE — ED Notes (Signed)
Patient is out of his hypertension medication at this time.  Patient could also be taking too much of the eye drops.

## 2021-02-09 NOTE — ED Provider Notes (Signed)
I provided a substantive portion of the care of this patient.  I personally performed the entirety of the medical decision making for this encounter.    87 year old male presents with altered status after being given eyedrops of atropine.  Symptoms actually began to resolve.  He is afebrile.  Neurological exam is stable.  Will discharge   Lacretia Leigh, MD 02/09/21 2230

## 2021-02-09 NOTE — Discharge Instructions (Addendum)
Call Dr. Midge Aver, your eye doctor, tomorrow first thing in the morning to discuss your side effect of the medication. Do not use the ATROPINE eye drops until further discussion with your eye doctor. Return to the ER if you develop any new or concerning symptoms.

## 2021-02-10 NOTE — ED Notes (Signed)
All appropriate discharge materials reviewed at length with patient. Time for questions provided. Pt has no other questions at this time and verbalizes understanding of all provided materials.  

## 2021-04-03 ENCOUNTER — Ambulatory Visit: Payer: Medicare Other | Admitting: Cardiology

## 2021-04-07 ENCOUNTER — Other Ambulatory Visit: Payer: Self-pay | Admitting: Cardiology

## 2021-04-07 DIAGNOSIS — I5022 Chronic systolic (congestive) heart failure: Secondary | ICD-10-CM

## 2021-04-22 ENCOUNTER — Encounter: Payer: Self-pay | Admitting: Cardiology

## 2021-04-22 ENCOUNTER — Ambulatory Visit: Payer: Medicare Other | Admitting: Cardiology

## 2021-04-22 ENCOUNTER — Other Ambulatory Visit: Payer: Self-pay

## 2021-04-22 VITALS — BP 139/71 | HR 74 | Resp 16 | Ht 71.0 in | Wt 169.0 lb

## 2021-04-22 DIAGNOSIS — I5022 Chronic systolic (congestive) heart failure: Secondary | ICD-10-CM

## 2021-04-22 DIAGNOSIS — I110 Hypertensive heart disease with heart failure: Secondary | ICD-10-CM

## 2021-04-22 DIAGNOSIS — I119 Hypertensive heart disease without heart failure: Secondary | ICD-10-CM

## 2021-04-22 DIAGNOSIS — I428 Other cardiomyopathies: Secondary | ICD-10-CM

## 2021-04-22 DIAGNOSIS — Z7901 Long term (current) use of anticoagulants: Secondary | ICD-10-CM

## 2021-04-22 DIAGNOSIS — I4821 Permanent atrial fibrillation: Secondary | ICD-10-CM

## 2021-04-22 DIAGNOSIS — I2510 Atherosclerotic heart disease of native coronary artery without angina pectoris: Secondary | ICD-10-CM

## 2021-04-22 NOTE — Progress Notes (Signed)
Ronald [name] Date of Birth: May 31, 1934 MRN: [id number] Primary Care Provider:Kaplan, Baldemar Friday., PA-C Former Cardiology Providers: Dr. Vear Clock Primary Cardiologist: Rex Kras, DO, [facility] (established care 12/31/2019)  Date: 04/22/21 Last Office Visit: 01/01/2021   Chief Complaint  Patient presents with   heart failure with reduced ejection fraction   Elevated heartrate    Follow-up    HPI  Ronald [name] is a 87 y.o.  male who presents to the office with a chief complaint of " 72-month follow-up for management of heart failure and atrial fibrillation." Patient's past medical history and cardiovascular risk factors include: dilated nonischemic cardiomyopathy, heart failure with reduced EF, permanent atrial fibrillation, nonobstructive coronary artery disease, hypertension, former smoker, advanced age.  Patient is being followed by our practice for the management of his underlying congestive heart failure and atrial fibrillation.  Patient is accompanied by his daughter Jeanett Schlein at today's visit.  Since last office visit patient states that he is doing well from a cardiovascular standpoint.  No symptoms to suggest ACS or heart failure exacerbation.  He has lost approximately 18 pounds since last office visit due to lifestyle modifications.  He comes in today for an acute visit as he had gone to see another provider yesterday and his heart rate was severely elevated greater than 100 bpm.  It was shortly lived and has not had a reoccurrence.  Patient's vital signs at today's office visit are relatively stable.  Patient continues to be on Xarelto and does not endorse any evidence of bleeding.   Since last office visit he is also undergone eye surgery and has not had any significant improvement with his right eye vision.  ALLERGIES: Allergies  Allergen Reactions   Lactose Intolerance (Gi) Other (See Comments)    GI upset   MEDICATION LIST PRIOR TO VISIT: Current Outpatient  Medications on File Prior to Visit  Medication Sig Dispense Refill   acetaminophen (TYLENOL) 500 MG tablet Take 500 mg by mouth as needed.     Chlorpheniramine Maleate (WAL-FINATE PO) Take by mouth.     cyanocobalamin 2000 MCG tablet Take 2,000 mcg by mouth daily.     dorzolamide-timolol (COSOPT) 22.3-6.8 MG/ML ophthalmic solution 1 drop 2 (two) times daily.     furosemide (LASIX) 20 MG tablet Take 20 mg by mouth daily.     metoprolol (TOPROL XL) 200 MG 24 hr tablet Take 1 tablet (200 mg total) by mouth daily. Hold if systolic blood pressure (top blood pressure number) less than 100 mmHg or heart rate less than 60 bpm (pulse). 90 tablet 0   spironolactone (ALDACTONE) 25 MG tablet TAKE 1 TABLET BY MOUTH EVERY DAY IN THE MORNING 90 tablet 0   XARELTO 20 MG TABS tablet TAKE 1 TABLET BY MOUTH DAILY WITH SUPPER. 90 tablet 3   Polyethyl Glyc-Propyl Glyc PF (SYSTANE HYDRATION PF) 0.4-0.3 % SOLN Apply to eye.     Travoprost, BAK Free, (TRAVATAN) 0.004 % SOLN ophthalmic solution Place 1 drop into both eyes nightly.     Current Facility-Administered Medications on File Prior to Visit  Medication Dose Route Frequency Provider Last Rate Last Admin   cyanocobalamin ((VITAMIN B-12)) injection 1,000 mcg  1,000 mcg Intramuscular Q30 days Sable Feil, MD   1,000 mcg at 12/04/12 7782    PAST MEDICAL HISTORY: Past Medical History:  Diagnosis Date   Allergic rhinitis, cause unspecified    Atrial fibrillation (HCC)    BPH (benign prostatic hypertrophy)    CHF (congestive heart  failure) ([place])    Essential hypertension, benign    GERD (gastroesophageal reflux disease)    Glaucoma    Hepatic cyst    Hiatal hernia 2014   History of gout    Hyperlipidemia    Irritable bowel syndrome    Lactose intolerance    Personal history of colonic polyps 03/04/1992   adenomatous polyp   Pneumonia    "~ 5 times in my lifetime; last time was 03/2014" (10/14/2017)   Reflux esophagitis    Schatzki's ring 2014    Sleep apnea    Vitamin B12 deficiency     PAST SURGICAL HISTORY: Past Surgical History:  Procedure Laterality Date   CATARACT EXTRACTION W/ INTRAOCULAR LENS IMPLANT Left    HYDROCELE EXCISION Bilateral 10/2016   Archie Endo 10/11/2016   INTRAVASCULAR PRESSURE WIRE/FFR STUDY N/A 10/17/2017   Procedure: INTRAVASCULAR PRESSURE WIRE/FFR STUDY;  Surgeon: Nigel Mormon, MD;  Location: [facility];  Service: Cardiovascular;  Laterality: N/A;   NASAL RECONSTRUCTION     "tried to fix my nose so I can smell; still can't smell" (10/14/2017)   RIGHT/LEFT HEART CATH AND CORONARY ANGIOGRAPHY N/A 10/17/2017   Procedure: RIGHT/LEFT HEART CATH AND CORONARY ANGIOGRAPHY;  Surgeon: Nigel Mormon, MD;  Location: [facility];  Service: Cardiovascular;  Laterality: N/A;   TRANSURETHRAL RESECTION OF PROSTATE  10/2007   Archie Endo 08/22/2007   ULTRASOUND GUIDANCE FOR VASCULAR ACCESS  10/17/2017   Procedure: Ultrasound Guidance For Vascular Access;  Surgeon: Nigel Mormon, MD;  Location: [facility];  Service: Cardiovascular;;    FAMILY HISTORY: The patient's family history includes Heart disease in his sister.   SOCIAL HISTORY:  The patient  reports that he quit smoking about 37 years ago. His smoking use included cigarettes. He has a 30.00 pack-year smoking history. He has never used smokeless tobacco. He reports that he does not drink alcohol and does not use drugs.  Review of Systems  Constitutional: Negative for chills and fever.  HENT:  Negative for hoarse voice and nosebleeds.   Eyes:  Negative for discharge, double vision and pain.  Cardiovascular:  Negative for chest pain, claudication, dyspnea on exertion, leg swelling, near-syncope, orthopnea, palpitations, paroxysmal nocturnal dyspnea and syncope.  Respiratory:  Negative for hemoptysis and shortness of breath.   Musculoskeletal:  Negative for muscle cramps and myalgias.  Gastrointestinal:  Negative for abdominal  pain, constipation, diarrhea, hematemesis, hematochezia, melena, nausea and vomiting.  Neurological:  Negative for dizziness and light-headedness.  PHYSICAL EXAM: Vitals with BMI 04/22/2021 02/10/2021 02/09/2021  Height 5\' 11"  - -  Weight 169 lbs - -  BMI 84.16 - -  Systolic 606 301 601  Diastolic 71 97 97  Pulse 74 78 77    CONSTITUTIONAL: Well-developed and well-nourished. No acute distress.  SKIN: Skin is warm and dry. No rash noted. No cyanosis. No pallor. No jaundice HEAD: Normocephalic and atraumatic.  EYES: No scleral icterus MOUTH/THROAT: Moist oral membranes.  NECK: No JVD present. No thyromegaly noted. No carotid bruits  LYMPHATIC: No visible cervical adenopathy.  CHEST Normal respiratory effort. No intercostal retractions  LUNGS: Clear to auscultation bilaterally.  No stridor. No wheezes. No rales.  CARDIOVASCULAR: Irregularly irregular, soft systolic ejection murmur heard at the right intercostal space, a 3 out of 6 holosystolic murmur heard at the apex, no gallops or rubs. ABDOMINAL: No apparent ascites.  EXTREMITIES: No peripheral edema  HEMATOLOGIC: No significant bruising NEUROLOGIC: Oriented to person, place, and time. Nonfocal. Normal muscle  tone.  PSYCHIATRIC: Normal mood and affect. Normal behavior. Cooperative  CARDIAC DATABASE: EKG: 01/01/2021: Atrial fibrillation, 64 bpm, LVH per voltage criteria, ST-T changes most likely secondary to repolarization.   04/22/2021: Atrial fibrillation, 71 bpm, LVH per voltage criteria, ST-T changes most likely secondary to LVH but underlying ischemia cannot be ruled out.    Echocardiogram: 11/07/2018: LVEF 35-40%, calculated LVEF 41%, mild LVH, biatrial dilation, RV mildly dilated, RV function low normal, mild MR, moderate to severe TR.  Stress Testing:  Lexiscan myoview stress test 08/08/2017: 1. Low risk of hemodynamically significant coronary artery disease. Prognostically, this is a high risk study. The LV is dilated both in  rest and stress images with severe global hypokinesis. 2. Rest and stress EKG show A. FIb and occasional PVC. Non diagnostic due to pharmacologic stress. 3. Fixed perfusion defect due to diaphragmatic attenuation. 4. The gated study showed the ejection fraction was <10%.  Heart Catheterization: RHC/LHC 10/17/2017: Nonobstructive coronary artery disease- 40% stenosis mid LAD, 60% second diagonal, 50% stenosis of first obtuse marginal. Nonischemic cardiomyopathy WHO Grp II pulmonary hypertension mPAP 30 mmhg.  LABORATORY DATA: CBC Latest Ref Rng & Units 01/23/2020 12/26/2019 09/18/2019  WBC 4.0 - 10.5 K/uL 2.4(L) 2.4(L) 2.2(L)  Hemoglobin 13.0 - 17.0 g/dL 14.2 14.4 13.7  Hematocrit 39.0 - 52.0 % 45.3 46.7 42.8  Platelets 150 - 400 K/uL 119(L) 125(L) 113(L)    CMP Latest Ref Rng & Units 01/12/2021 06/16/2020 04/09/2020  Glucose 65 - 99 mg/dL 97 99 82  BUN 8 - 27 mg/dL 15 12 17   Creatinine 0.76 - 1.27 mg/dL 1.22 1.16 1.27  Sodium 134 - 144 mmol/L 145(H) 145(H) 145(H)  Potassium 3.5 - 5.2 mmol/L 3.6 3.7 4.8  Chloride 96 - 106 mmol/L 102 103 104  CO2 20 - 29 mmol/L 28 30(H) 27  Calcium 8.6 - 10.2 mg/dL 9.5 9.2 9.8  Total Protein 6.0 - 8.5 g/dL - - -  Total Bilirubin 0.0 - 1.2 mg/dL - - -  Alkaline Phos 39 - 117 IU/L - - -  AST 0 - 40 IU/L - - -  ALT 0 - 44 IU/L - - -    Lipid Panel     Component Value Date/Time   CHOL 124 06/16/2020 0943   TRIG 68 06/16/2020 0943   HDL 51 06/16/2020 0943   CHOLHDL 2.5 03/25/2014 0235   VLDL 12 03/25/2014 0235   LDLCALC 59 06/16/2020 0943   LABVLDL 14 06/16/2020 0943    Lab Results  Component Value Date   HGBA1C 5.8 (H) 03/26/2014   No components found for: NTPROBNP Lab Results  Component Value Date   TSH 0.84 08/10/2013   TSH 1.01 11/20/2010    Cardiac Panel (last 3 results) No results for input(s): CKTOTAL, CKMB, TROPONINIHS, RELINDX in the last 72 hours.  IMPRESSION:    ICD-10-CM   1. Chronic HFrEF (heart failure with reduced  ejection fraction) (HCC)  I50.22 EKG 75-FFMB    Basic metabolic panel    Pro b natriuretic peptide (BNP)    2. Nonischemic cardiomyopathy (HCC)  I42.8     3. Nonobstructive atherosclerosis of coronary artery  I25.10     4. Permanent atrial fibrillation (HCC)  W46.65 Basic metabolic panel    5. Long term (current) use of anticoagulants  Z79.01 Hemoglobin and hematocrit, blood    6. Hypertension with heart disease  I11.9     7. Hypertensive heart disease with heart failure (HCC)  I11.0  RECOMMENDATIONS: Ronald [name] is a 87 y.o. male whose past medical history and cardiovascular risk factors include: dilated nonischemic cardiomyopathy, heart failure with reduced EF, permanent atrial fibrillation, nonobstructive coronary artery disease, hypertension, former smoker, advanced age.   Chronic heart failure with reduced EF, stage C, NYHA class II: Medications reconciled. Lost 18# since last OV.  For reasons unknown he is no longer on Entresto 97/103mg  po bid.  Will check baseline labs, and if stable he will be asked to restart [facility] - script needs to be sent to [facility].  If Delene Loll is restarted he will need follow up labs that still need to be ordered.  Uses lasix as prn.  Fluid restriction to <2L per day, Na restriction < 2g per day Most recent blood work independently reviewed the patient at today's office visit.      Hypertension with heart failure with reduced EF: Continue current medical therapy.   Permanent atrial fibrillation: CHA2DS2-VASc SCORE is 4 which correlates to 4 % risk of stroke per year. Rate control: Beta-blocker therapy. Thromboembolic prophylaxis: Xarelto.  Patient is requested to take Xarelto with supper.   Long-term oral anticoagulation: Indication: Atrial fibrillation. Patient does not endorse any evidence of bleeding. Risks, alternatives, and benefits discussed with the patient in regards oral anticoagulation therapy.  Patient verbalizes  understanding that he will seek medical attention to closest ER if he notices bleeding or sustains injury despite mechanism of action. Check renal function and H&H.   FINAL MEDICATION LIST END OF ENCOUNTER: No orders of the defined types were placed in this encounter.    Current Outpatient Medications:    acetaminophen (TYLENOL) 500 MG tablet, Take 500 mg by mouth as needed., Disp: , Rfl:    Chlorpheniramine Maleate (WAL-FINATE PO), Take by mouth., Disp: , Rfl:    cyanocobalamin 2000 MCG tablet, Take 2,000 mcg by mouth daily., Disp: , Rfl:    dorzolamide-timolol (COSOPT) 22.3-6.8 MG/ML ophthalmic solution, 1 drop 2 (two) times daily., Disp: , Rfl:    furosemide (LASIX) 20 MG tablet, Take 20 mg by mouth daily., Disp: , Rfl:    metoprolol (TOPROL XL) 200 MG 24 hr tablet, Take 1 tablet (200 mg total) by mouth daily. Hold if systolic blood pressure (top blood pressure number) less than 100 mmHg or heart rate less than 60 bpm (pulse)., Disp: 90 tablet, Rfl: 0   spironolactone (ALDACTONE) 25 MG tablet, TAKE 1 TABLET BY MOUTH EVERY DAY IN THE MORNING, Disp: 90 tablet, Rfl: 0   XARELTO 20 MG TABS tablet, TAKE 1 TABLET BY MOUTH DAILY WITH SUPPER., Disp: 90 tablet, Rfl: 3   Polyethyl Glyc-Propyl Glyc PF (SYSTANE HYDRATION PF) 0.4-0.3 % SOLN, Apply to eye., Disp: , Rfl:    Travoprost, BAK Free, (TRAVATAN) 0.004 % SOLN ophthalmic solution, Place 1 drop into both eyes nightly., Disp: , Rfl:   Current Facility-Administered Medications:    cyanocobalamin ((VITAMIN B-12)) injection 1,000 mcg, 1,000 mcg, Intramuscular, Q30 days, Sable Feil, MD, 1,000 mcg at 12/04/12 4696  Orders Placed This Encounter  Procedures   Basic metabolic panel   Pro b natriuretic peptide (BNP)   Hemoglobin and hematocrit, blood   EKG 12-Lead   --Continue cardiac medications as reconciled in final medication list. --Return in about 6 months (around 10/22/2021) for Follow up, heart failure management.. Or sooner if  needed. --Continue follow-up with your primary care physician regarding the management of your other chronic comorbid conditions.  Patient's questions and concerns were addressed to his satisfaction. He  voices understanding of the instructions provided during this encounter.   This note was created using a voice recognition software as a result there may be grammatical errors inadvertently enclosed that do not reflect the nature of this encounter. Every attempt is made to correct such errors.  Total time spent: 35 minutes.  Rex Kras, [place], [facility]  Pager: [phone] Office: [phone]

## 2021-04-23 LAB — BASIC METABOLIC PANEL
BUN/Creatinine Ratio: 12 (ref 10–24)
BUN: 16 mg/dL (ref 8–27)
CO2: 29 mmol/L (ref 20–29)
Calcium: 9.3 mg/dL (ref 8.6–10.2)
Chloride: 101 mmol/L (ref 96–106)
Creatinine, Ser: 1.29 mg/dL — ABNORMAL HIGH (ref 0.76–1.27)
Glucose: 76 mg/dL (ref 65–99)
Potassium: 4.1 mmol/L (ref 3.5–5.2)
Sodium: 142 mmol/L (ref 134–144)
eGFR: 54 mL/min/{1.73_m2} — ABNORMAL LOW (ref >59)

## 2021-04-23 LAB — HEMOGLOBIN AND HEMATOCRIT, BLOOD
Hematocrit: 47.6 % (ref 37.5–51.0)
Hemoglobin: 16.1 g/dL (ref 13.0–17.7)

## 2021-04-23 LAB — PRO B NATRIURETIC PEPTIDE: NT-Pro BNP: 2230 pg/mL — ABNORMAL HIGH (ref 0–486)

## 2021-04-24 ENCOUNTER — Other Ambulatory Visit: Payer: Self-pay | Admitting: Cardiology

## 2021-04-24 DIAGNOSIS — I5022 Chronic systolic (congestive) heart failure: Secondary | ICD-10-CM

## 2021-04-24 DIAGNOSIS — I4821 Permanent atrial fibrillation: Secondary | ICD-10-CM

## 2021-04-24 DIAGNOSIS — Z7901 Long term (current) use of anticoagulants: Secondary | ICD-10-CM

## 2021-04-24 MED ORDER — ENTRESTO 97-103 MG PO TABS: 1.0000 | ORAL_TABLET | Freq: Two times a day (BID) | ORAL | 0 refills | Status: AC

## 2021-04-24 MED ORDER — RIVAROXABAN 15 MG PO TABS: 15.0000 mg | ORAL_TABLET | Freq: Every day | ORAL | 0 refills | Status: DC

## 2021-04-27 ENCOUNTER — Other Ambulatory Visit: Payer: Self-pay

## 2021-04-27 DIAGNOSIS — I5022 Chronic systolic (congestive) heart failure: Secondary | ICD-10-CM

## 2021-04-27 MED ORDER — METOPROLOL SUCCINATE ER 200 MG PO TB24: 200.0000 mg | ORAL_TABLET | Freq: Every day | ORAL | 0 refills | Status: DC

## 2021-04-29 ENCOUNTER — Other Ambulatory Visit: Payer: Self-pay

## 2021-04-29 DIAGNOSIS — Z7901 Long term (current) use of anticoagulants: Secondary | ICD-10-CM

## 2021-04-29 DIAGNOSIS — I4821 Permanent atrial fibrillation: Secondary | ICD-10-CM

## 2021-04-29 MED ORDER — RIVAROXABAN 15 MG PO TABS: 15.0000 mg | ORAL_TABLET | Freq: Every day | ORAL | 0 refills | Status: DC

## 2021-05-13 LAB — BASIC METABOLIC PANEL
BUN/Creatinine Ratio: 16 (ref 10–24)
BUN: 18 mg/dL (ref 8–27)
CO2: 31 mmol/L — ABNORMAL HIGH (ref 20–29)
Calcium: 9.2 mg/dL (ref 8.6–10.2)
Chloride: 102 mmol/L (ref 96–106)
Creatinine, Ser: 1.15 mg/dL (ref 0.76–1.27)
Glucose: 95 mg/dL (ref 65–99)
Potassium: 4 mmol/L (ref 3.5–5.2)
Sodium: 149 mmol/L — ABNORMAL HIGH (ref 134–144)
eGFR: 62 mL/min/{1.73_m2} (ref >59)

## 2021-05-13 LAB — PRO B NATRIURETIC PEPTIDE: NT-Pro BNP: 2423 pg/mL — ABNORMAL HIGH (ref 0–486)

## 2021-05-13 LAB — MAGNESIUM: Magnesium: 2.2 mg/dL (ref 1.6–2.3)

## 2021-05-14 ENCOUNTER — Other Ambulatory Visit: Payer: Self-pay

## 2021-05-14 MED ORDER — DAPAGLIFLOZIN PROPANEDIOL 10 MG PO TABS: 10.0000 mg | ORAL_TABLET | Freq: Every day | ORAL | 1 refills | Status: DC

## 2021-05-14 NOTE — Progress Notes (Signed)
Called patient, NA, LMAM

## 2021-05-14 NOTE — Progress Notes (Signed)
Potassium, magnesium, and serum creatinine are all within normal limits.  Please make sure these labs were done after being on Entresto 97/103 mg p.o. twice daily for one week.   If so, send in a script for Farxiga 10mg  po qday and add labs NT-proBNP, BMP, and Mg level in one week.   Please call his daughter  Arta Bruce [phone].  ST

## 2021-05-14 NOTE — Progress Notes (Signed)
Spoke with daughter Arta Bruce, she confirmed that he was taking Entresto for 1 week, then had labs drawn. She stated that the [place] needs to be sent to the [place], that I have printed and placed on your desk to sign and she is aware that she is to take him to have more blood work in 1 week from the day he starts the [place].

## 2021-05-15 ENCOUNTER — Other Ambulatory Visit: Payer: Self-pay

## 2021-05-19 ENCOUNTER — Other Ambulatory Visit: Payer: Self-pay

## 2021-05-19 ENCOUNTER — Telehealth: Payer: Self-pay

## 2021-05-19 DIAGNOSIS — I5022 Chronic systolic (congestive) heart failure: Secondary | ICD-10-CM

## 2021-05-19 MED ORDER — SPIRONOLACTONE 25 MG PO TABS: ORAL_TABLET | ORAL | 0 refills | Status: DC

## 2021-05-19 NOTE — Telephone Encounter (Signed)
That's fine.  But would still need labs 1 week after being on Jardiance.

## 2021-05-19 NOTE — Telephone Encounter (Signed)
What strength for the Jardiance?

## 2021-05-19 NOTE — Telephone Encounter (Signed)
Pts called and stated that the [place] requests that pts Farxiga 10mg , gets switched to [place] instead. They di s=not have [place]. Please advise.

## 2021-05-20 ENCOUNTER — Other Ambulatory Visit: Payer: Self-pay

## 2021-05-20 DIAGNOSIS — I5022 Chronic systolic (congestive) heart failure: Secondary | ICD-10-CM

## 2021-05-20 MED ORDER — EMPAGLIFLOZIN 10 MG PO TABS: 10.0000 mg | ORAL_TABLET | Freq: Every day | ORAL | 0 refills | Status: DC

## 2021-05-20 NOTE — Telephone Encounter (Signed)
Jardiance 10mg  po qAM. 30 tabs no refill  Will needs labs in one week after starting Jardiance (order the labs if not done).

## 2021-05-20 NOTE — Telephone Encounter (Signed)
Prescription has been sent in. Pt aware, labs have been ordered.

## 2021-05-26 ENCOUNTER — Other Ambulatory Visit: Payer: Self-pay

## 2021-05-26 DIAGNOSIS — I5022 Chronic systolic (congestive) heart failure: Secondary | ICD-10-CM

## 2021-05-26 NOTE — Progress Notes (Signed)
Spoke to patient's daughter Madlyn Frankel she said she will take patient to go get labs. Labs have been added and released

## 2021-06-03 NOTE — Progress Notes (Signed)
Spoke to patient's daughter she said they just got home from getting his labs done

## 2021-06-04 LAB — PRO B NATRIURETIC PEPTIDE: NT-Pro BNP: 2855 pg/mL — ABNORMAL HIGH (ref 0–486)

## 2021-06-04 LAB — BASIC METABOLIC PANEL
BUN/Creatinine Ratio: 10 (ref 10–24)
BUN: 16 mg/dL (ref 8–27)
CO2: 30 mmol/L — ABNORMAL HIGH (ref 20–29)
Calcium: 9.5 mg/dL (ref 8.6–10.2)
Chloride: 103 mmol/L (ref 96–106)
Creatinine, Ser: 1.6 mg/dL — ABNORMAL HIGH (ref 0.76–1.27)
Glucose: 82 mg/dL (ref 65–99)
Potassium: 3.9 mmol/L (ref 3.5–5.2)
Sodium: 147 mmol/L — ABNORMAL HIGH (ref 134–144)
eGFR: 42 mL/min/{1.73_m2} — ABNORMAL LOW (ref >59)

## 2021-06-04 LAB — MAGNESIUM: Magnesium: 2.2 mg/dL (ref 1.6–2.3)

## 2021-06-08 NOTE — Progress Notes (Signed)
Spoke to patient's daughter she is aware

## 2021-06-09 ENCOUNTER — Other Ambulatory Visit: Payer: Self-pay

## 2021-06-09 DIAGNOSIS — I5022 Chronic systolic (congestive) heart failure: Secondary | ICD-10-CM

## 2021-07-17 ENCOUNTER — Other Ambulatory Visit: Payer: Self-pay

## 2021-07-17 DIAGNOSIS — I5022 Chronic systolic (congestive) heart failure: Secondary | ICD-10-CM

## 2021-07-17 NOTE — Progress Notes (Signed)
Spoke to patient's daughter she is aware patient's labs are pending she will take him to get his labs done

## 2021-07-21 LAB — BASIC METABOLIC PANEL
BUN/Creatinine Ratio: 14 (ref 10–24)
BUN: 19 mg/dL (ref 8–27)
CO2: 26 mmol/L (ref 20–29)
Calcium: 9.6 mg/dL (ref 8.6–10.2)
Chloride: 104 mmol/L (ref 96–106)
Creatinine, Ser: 1.35 mg/dL — ABNORMAL HIGH (ref 0.76–1.27)
Glucose: 97 mg/dL (ref 65–99)
Potassium: 4.3 mmol/L (ref 3.5–5.2)
Sodium: 144 mmol/L (ref 134–144)
eGFR: 51 mL/min/{1.73_m2} — ABNORMAL LOW (ref >59)

## 2021-07-21 LAB — PRO B NATRIURETIC PEPTIDE: NT-Pro BNP: 1291 pg/mL — ABNORMAL HIGH (ref 0–486)

## 2021-07-23 ENCOUNTER — Other Ambulatory Visit: Payer: Self-pay

## 2021-07-23 DIAGNOSIS — I5022 Chronic systolic (congestive) heart failure: Secondary | ICD-10-CM

## 2021-07-23 NOTE — Progress Notes (Signed)
No answer left a vm to call back

## 2021-07-23 NOTE — Progress Notes (Signed)
Patient's daughter returned our call she is aware

## 2021-07-27 ENCOUNTER — Other Ambulatory Visit: Payer: Self-pay

## 2021-07-27 ENCOUNTER — Telehealth: Payer: Self-pay | Admitting: Cardiology

## 2021-07-27 DIAGNOSIS — I5022 Chronic systolic (congestive) heart failure: Secondary | ICD-10-CM

## 2021-07-27 MED ORDER — METOPROLOL SUCCINATE ER 200 MG PO TB24: 200.0000 mg | ORAL_TABLET | Freq: Every day | ORAL | 3 refills | Status: DC

## 2021-07-27 NOTE — Telephone Encounter (Signed)
Done

## 2021-07-27 NOTE — Telephone Encounter (Signed)
Pt needs a written refill on his 90 day rx for metoprolol so he can pick up and take to the [place]

## 2021-09-01 NOTE — Progress Notes (Signed)
Assessment/Plan:   Ronald [name] is a 87 y.o. year old male with risk factors including  age, hypertension, hyperlipidemia, CAD, anxiety, depression and  seen today for evaluation of memory loss.  He was unable to do the MoCA.  MMSE today was 15/30 with deficiencies in most items, unable to write a sentence, or copy a picture.  Delayed recall 1/3.  MRI of the brain without contrast on 08/26/2021 performed at [facility] reveals possible small subacute ischemic infarction within the right centrum semiovale,  disproportionate ventriculomegaly suspicious for normal pressure hydrocephalus.  As well, brain parenchymal volume loss with possible disproportionate involvement of the occipital lobes were seen.     Dementia, likely due to multiple etiologies  Recommendations  Large-volume tap, in view of ventricular abnormalities, will need PT involvement.  If the study is negative, then will entertain PET scan for other MRI abnormalities Check urinalysis for infection Follow-up in 1 month at which time other MRI abnormalities, and evaluation for Lewy body dementia will follow  Subjective:   The patient is seen in neurologic consultation at the request of Pieter Partridge, PA for the evaluation of memory.  The patient is accompanied by his friend Jeneen Rinks who supplements the history. This is a 87 y.o. year old male who has had memory issues for about 6 months, when he began experiencing hallucinations at night.  However, over the last 3 weeks, he has he began to have some hallucinations, intermittent hallucinations that have become more evident.  "They are not scary ".  He is found mopping the floors in the middle of the night, seeing people who do not respond to his conversations.  He also sees dead relatives, including his wife.  In the kitchen, he sees a little girl who does not talk to him, when he turns to see her, she goes away he says.  At times, he has auditory hallucinations, having  conversations with unknown people.  He denies having any other visual hallucinations of objects, or animals.  He does repeat the same stories and asked the same questions, and does not know at times why he came to a room or what he was looking for.  He denies leaving objects in unusual places.  He is a former Development worker, international aid, and he was very active until 6 months ago.  He lives with his friend Jeneen Rinks who has noticed all this changes as well.  His mood is overall "good, with a little depression ".  He denies irritability.  When he sleeps, he has vivid dreams, and may have some REM behavior.  Denies sleepwalking.  No apparent paranoia.  There are no hygiene concerns.  He does need assistance with bathing, because of vision problems.  He is independent of dressing.  His friend places the medications in the pillbox for the last 6 months.  His friend manages the finances.  His appetite is good, denies trouble swallowing.  He does not cook.  He ambulates with a cane, without falls.  Occasionally he may "feel like tripping ", but denies hitting his head or having any injuries.  His friend reports that the patient steps are smaller, more cautious, especially over the last 3 weeks.  No frank shuffling of the feet.  He denies any headaches.  Occasionally he has double vision, denies any dizziness, focal numbness or tingling, unilateral weakness, tremors or anosmia.  No history of seizures.  He has significant urine incontinence, and especially at night, he gets up 5  times to urinate.  He denies constipation or diarrhea.  He has a history of anosmia, which is present for many years.  No history of OSA, alcohol or tobacco.  Family history remarkable for sister with dementia.  Labs  Vit B12 1019,TSH  1.89, H/H 16.4/51, PLT 149    Current Outpatient Medications  Medication Instructions   acetaminophen (TYLENOL) 500 mg, Oral, As needed   Chlorpheniramine Maleate (WAL-FINATE PO) Oral   cyanocobalamin 2,000 mcg, Oral, Daily    empagliflozin (JARDIANCE) 10 mg, Oral, Daily   furosemide (LASIX) 20 mg, Daily   metoprolol (TOPROL XL) 200 mg, Oral, Daily, Hold if systolic blood pressure (top blood pressure number) less than 100 mmHg or heart rate less than 60 bpm (pulse).   Polyethyl Glyc-Propyl Glyc PF (SYSTANE HYDRATION PF) 0.4-0.3 % SOLN Apply to eye.   Rivaroxaban (XARELTO) 15 mg, Oral, Daily with supper   spironolactone (ALDACTONE) 25 MG tablet TAKE 1 TABLET BY MOUTH EVERY DAY IN THE MORNING   traZODone (DESYREL) 50 mg, Oral, Daily at bedtime     VITALS:   Vitals:   09/02/21 0951  BP: (!) 160/85  Pulse: 93  SpO2: 98%  Weight: 168 lb 6.4 oz (76.4 kg)  Height: 5\' 11"  (1.803 m)   No flowsheet data found.  PHYSICAL EXAM   HEENT:  Normocephalic, atraumatic. The mucous membranes are moist. The superficial temporal arteries are without ropiness or tenderness. Cardiovascular: Regular rate and rhythm. Lungs: Clear to auscultation bilaterally. Neck: There are no carotid bruits noted bilaterally.  NEUROLOGICAL: No flowsheet data found. MMSE - Mini Mental State Exam 09/02/2021  Orientation to time 2  Orientation to Place 3  Registration 2  Attention/ Calculation 1  Recall 1  Language- name 2 objects 2  Language- repeat 0  Language- follow 3 step command 3  Language- read & follow direction 1  Write a sentence 0  Copy design 0  Total score 15    No flowsheet data found.   Orientation:  Alert and oriented to person, place not to time. No aphasia or dysarthria. Fund of knowledge is reduced. Recent and remote memory impaired Attention and concentration are reduced.  Able to name objects  unable to repeat phrases. Delayed recall  1/3 Cranial nerves: There is good facial symmetry.  R Extraocular muscles are intact and visual fields are full to confrontational testing. L vision field decreased . Speech is fluent and clear. Soft palate rises symmetrically and there is no tongue deviation. Hearing is intact to  conversational tone. Tone: Tone is good throughout. Sensation: Sensation is intact to light touch and pinprick throughout. Vibration is intact at the bilateral big toe.There is no extinction with double simultaneous stimulation. There is no sensory dermatomal level identified. Coordination: The patient has some difficulty with RAM's . No difficulties with FNF bilaterally. Normal finger to nose  Motor: Strength is 5/5 in the bilateral upper and lower extremities. There is no pronator drift. There are no fasciculations noted. DTR's: Deep tendon reflexes are 2/4 at the bilateral biceps, triceps, brachioradialis, patella and achilles.  Plantar responses are downgoing bilaterally. Gait and Station: The patient is able to ambulate with a R cane, cautiously The patient is unable to heel toe walk without any difficulty.The patient is able to ambulate in a tandem fashion. The patient is able to stand in the Romberg position.     Thank you for allowing [place] the opportunity to participate in the care of this nice patient. Please do not hesitate  to contact us for any questions or concerns.   Total time spent on today's visit was 60 minutes, including both face-to-face time and nonface-to-face time.  Time included that spent on review of records (prior notes available to me/labs/imaging if pertinent), discussing treatment and goals, answering patient's questions and coordinating care.  Cc:  Aletha Halim., PA-C  Sharene Butters 09/02/2021 12:58 PM

## 2021-09-02 ENCOUNTER — Other Ambulatory Visit: Payer: Medicare Other

## 2021-09-02 ENCOUNTER — Other Ambulatory Visit: Payer: Self-pay

## 2021-09-02 ENCOUNTER — Ambulatory Visit: Payer: Medicare Other | Admitting: Physician Assistant

## 2021-09-02 ENCOUNTER — Encounter: Payer: Self-pay | Admitting: Physician Assistant

## 2021-09-02 VITALS — BP 160/85 | HR 93 | Ht 71.0 in | Wt 168.4 lb

## 2021-09-02 DIAGNOSIS — G912 (Idiopathic) normal pressure hydrocephalus: Secondary | ICD-10-CM | POA: Diagnosis not present

## 2021-09-02 DIAGNOSIS — R443 Hallucinations, unspecified: Secondary | ICD-10-CM

## 2021-09-02 DIAGNOSIS — F03918 Unspecified dementia, unspecified severity, with other behavioral disturbance: Secondary | ICD-10-CM | POA: Insufficient documentation

## 2021-09-02 NOTE — Addendum Note (Signed)
Addended by: Venetia Night on: 09/02/2021 03:02 PM   Modules accepted: Orders

## 2021-09-02 NOTE — Patient Instructions (Addendum)
It was a pleasure to see you today at our office.   Recommendations:  Check urine  Will schedule for tap to remove the fluid of the brain  Follow up in 1 month   RECOMMENDATIONS FOR ALL PATIENTS WITH MEMORY PROBLEMS: 1. Continue to exercise (Recommend 30 minutes of walking everyday, or 3 hours every week) 2. Increase social interactions - continue going to [place] and enjoy social gatherings with friends and family 3. Eat healthy, avoid fried foods and eat more fruits and vegetables 4. Maintain adequate blood pressure, blood sugar, and blood cholesterol level. Reducing the risk of stroke and cardiovascular disease also helps promoting better memory. 5. Avoid stressful situations. Live a simple life and avoid aggravations. Organize your time and prepare for the next day in anticipation. 6. Sleep well, avoid any interruptions of sleep and avoid any distractions in the bedroom that may interfere with adequate sleep quality 7. Avoid sugar, avoid sweets as there is a strong link between excessive sugar intake, diabetes, and cognitive impairment We discussed the Mediterranean diet, which has been shown to help patients reduce the risk of progressive memory disorders and reduces cardiovascular risk. This includes eating fish, eat fruits and green leafy vegetables, nuts like almonds and hazelnuts, walnuts, and also use olive oil. Avoid fast foods and fried foods as much as possible. Avoid sweets and sugar as sugar use has been linked to worsening of memory function.  There is always a concern of gradual progression of memory problems. If this is the case, then we may need to adjust level of care according to patient needs. Support, both to the patient and caregiver, should then be put into place.   FALL PRECAUTIONS: Be cautious when walking. Scan the area for obstacles that may increase the risk of trips and falls. When getting up in the mornings, sit up at the edge of the bed for a few minutes before  getting out of bed. Consider elevating the bed at the head end to avoid drop of blood pressure when getting up. Walk always in a well-lit room (use night lights in the walls). Avoid area rugs or power cords from appliances in the middle of the walkways. Use a walker or a cane if necessary and consider physical therapy for balance exercise. Get your eyesight checked regularly.  FINANCIAL OVERSIGHT: Supervision, especially oversight when making financial decisions or transactions is also recommended.  HOME SAFETY: Consider the safety of the kitchen when operating appliances like stoves, microwave oven, and blender. Consider having supervision and share cooking responsibilities until no longer able to participate in those. Accidents with firearms and other hazards in the house should be identified and addressed as well.   ABILITY TO BE LEFT ALONE: If patient is unable to contact 911 operator, consider using LifeLine, or when the need is there, arrange for someone to stay with patients. Smoking is a fire hazard, consider supervision or cessation. Risk of wandering should be assessed by caregiver and if detected at any point, supervision and safe proof recommendations should be instituted.  MEDICATION SUPERVISION: Inability to self-administer medication needs to be constantly addressed. Implement a mechanism to ensure safe administration of the medications.   DRIVING: Regarding driving, in patients with progressive memory problems, driving will be impaired. We advise to have someone else do the driving if trouble finding directions or if minor accidents are reported. Independent driving assessment is available to determine safety of driving.   If you are interested in the driving assessment, you can  contact the following:  [employer] in [place]  [place] [phone] or  [phone]    [place] refers to food and lifestyle choices that are based on the traditions of countries located on the [employer]. This way of eating has been shown to help prevent certain conditions and improve outcomes for people who have chronic diseases, like kidney disease and heart disease. What are tips for following this plan? Lifestyle  Cook and eat meals together with your family, when possible. Drink enough fluid to keep your urine clear or pale yellow. Be physically active every day. This includes: Aerobic exercise like running or swimming. Leisure activities like gardening, walking, or housework. Get 7-8 hours of sleep each night. If recommended by your health care provider, drink red wine in moderation. This means 1 glass a day for nonpregnant women and 2 glasses a day for men. A glass of wine equals 5 oz (150 mL). Reading food labels  Check the serving size of packaged foods. For foods such as rice and pasta, the serving size refers to the amount of cooked product, not dry. Check the total fat in packaged foods. Avoid foods that have saturated fat or trans fats. Check the ingredients list for added sugars, such as corn syrup. Shopping  At the grocery store, buy most of your food from the areas near the walls of the store. This includes: Fresh fruits and vegetables (produce). Grains, beans, nuts, and seeds. Some of these may be available in unpackaged forms or large amounts (in bulk). Fresh seafood. Poultry and eggs. Low-fat dairy products. Buy whole ingredients instead of prepackaged foods. Buy fresh fruits and vegetables in-season from local farmers markets. Buy frozen fruits and vegetables in resealable bags. If you do not have access to quality fresh seafood, buy precooked frozen shrimp or canned fish, such as tuna, salmon, or sardines. Buy small amounts of raw or cooked vegetables, salads, or olives from the deli or salad bar  at your store. Stock your pantry so you always have certain foods on hand, such as olive oil, canned tuna, canned tomatoes, rice, pasta, and beans. Cooking  Cook foods with extra-virgin olive oil instead of using butter or other vegetable oils. Have meat as a side dish, and have vegetables or grains as your main dish. This means having meat in small portions or adding small amounts of meat to foods like pasta or stew. Use beans or vegetables instead of meat in common dishes like chili or lasagna. Experiment with different cooking methods. Try roasting or broiling vegetables instead of steaming or sauteing them. Add frozen vegetables to soups, stews, pasta, or rice. Add nuts or seeds for added healthy fat at each meal. You can add these to yogurt, salads, or vegetable dishes. Marinate fish or vegetables using olive oil, lemon juice, garlic, and fresh herbs. Meal planning  Plan to eat 1 vegetarian meal one day each week. Try to work up to 2 vegetarian meals, if possible. Eat seafood 2 or more times a week. Have healthy snacks readily available, such as: Vegetable sticks with hummus. Greek yogurt. Fruit and nut trail mix. Eat balanced meals throughout the week. This includes: Fruit: 2-3 servings a day Vegetables: 4-5 servings a day Low-fat dairy: 2 servings a day Fish, poultry, or lean meat: 1 serving a day Beans and legumes: 2 or more servings a week Nuts and seeds: 1-2 servings a day  Whole grains: 6-8 servings a day Extra-virgin olive oil: 3-4 servings a day Limit red meat and sweets to only a few servings a month What are my food choices? Mediterranean diet Recommended Grains: Whole-grain pasta. Brown rice. Bulgar wheat. Polenta. Couscous. Whole-wheat bread. [place]. Vegetables: Artichokes. Beets. Broccoli. Cabbage. Carrots. Eggplant. Green beans. Chard. Kale. Spinach. Onions. Leeks. Peas. Squash. Tomatoes. Peppers. Radishes. Fruits: Apples. Apricots. Avocado. Berries.  Bananas. Cherries. Dates. Figs. Grapes. Lemons. Melon. Oranges. Peaches. Plums. Pomegranate. Meats and other protein foods: Beans. Almonds. Sunflower seeds. Pine nuts. Peanuts. [place]. Salmon. Scallops. Shrimp. [place]. Tilapia. Clams. Oysters. Eggs. Dairy: Low-fat milk. Cheese. Greek yogurt. Beverages: Water. Red wine. Herbal tea. Fats and oils: Extra virgin olive oil. Avocado oil. Grape seed oil. Sweets and desserts: [place] yogurt with honey. Baked apples. Poached pears. Trail mix. Seasoning and other foods: Basil. Cilantro. Coriander. Cumin. Mint. Parsley. Sage. Rosemary. Tarragon. Garlic. Oregano. Thyme. Pepper. Balsalmic vinegar. Tahini. Hummus. Tomato sauce. Olives. Mushrooms. Limit these Grains: Prepackaged pasta or rice dishes. Prepackaged cereal with added sugar. Vegetables: Deep fried potatoes (french fries). Fruits: Fruit canned in syrup. Meats and other protein foods: Beef. Pork. Lamb. Poultry with skin. Hot dogs. [place] Salines. Dairy: Ice cream. Sour cream. Whole milk. Beverages: Juice. Sugar-sweetened soft drinks. Beer. Liquor and spirits. Fats and oils: Butter. Canola oil. Vegetable oil. Beef fat (tallow). Lard. Sweets and desserts: Cookies. Cakes. Pies. Candy. Seasoning and other foods: Mayonnaise. Premade sauces and marinades. The items listed may not be a complete list. Talk with your dietitian about what dietary choices are right for you. Summary The Mediterranean diet includes both food and lifestyle choices. Eat a variety of fresh fruits and vegetables, beans, nuts, seeds, and whole grains. Limit the amount of red meat and sweets that you eat. Talk with your health care provider about whether it is safe for you to drink red wine in moderation. This means 1 glass a day for nonpregnant women and 2 glasses a day for men. A glass of wine equals 5 oz (150 mL). This information is not intended to replace advice given to you by your health care provider. Make sure you discuss any questions you  have with your health care provider. Document Released: 06/10/2016 Document Revised: 07/13/2016 Document Reviewed: 06/10/2016 Elsevier Interactive Patient Education  2017 [employer].

## 2021-09-02 NOTE — Addendum Note (Signed)
Addended by: Venetia Night on: 09/02/2021 01:31 PM   Modules accepted: Orders

## 2021-09-03 LAB — URINE CULTURE
MICRO NUMBER:: 12583752
SPECIMEN QUALITY:: ADEQUATE

## 2021-09-07 ENCOUNTER — Inpatient Hospital Stay (HOSPITAL_COMMUNITY): Admission: RE | Admit: 2021-09-07 | Payer: Medicare Other | Source: Ambulatory Visit

## 2021-09-11 ENCOUNTER — Ambulatory Visit (HOSPITAL_COMMUNITY)
Admission: RE | Admit: 2021-09-11 | Discharge: 2021-09-11 | Disposition: A | Discharge status: Home / Self Care | Payer: Medicare Other | Source: Ambulatory Visit | Attending: Family Medicine | Admitting: Family Medicine

## 2021-09-11 ENCOUNTER — Ambulatory Visit (HOSPITAL_COMMUNITY)
Admission: RE | Admit: 2021-09-11 | Discharge: 2021-09-11 | Disposition: A | Discharge status: Home / Self Care | Payer: Medicare Other | Source: Ambulatory Visit | Attending: Physician Assistant | Admitting: Physician Assistant

## 2021-09-11 ENCOUNTER — Other Ambulatory Visit: Payer: Self-pay

## 2021-09-11 DIAGNOSIS — E119 Type 2 diabetes mellitus without complications: Secondary | ICD-10-CM | POA: Diagnosis not present

## 2021-09-11 DIAGNOSIS — G912 (Idiopathic) normal pressure hydrocephalus: Secondary | ICD-10-CM | POA: Diagnosis present

## 2021-09-11 DIAGNOSIS — R351 Nocturia: Secondary | ICD-10-CM | POA: Diagnosis not present

## 2021-09-11 DIAGNOSIS — R4182 Altered mental status, unspecified: Secondary | ICD-10-CM | POA: Insufficient documentation

## 2021-09-11 DIAGNOSIS — R2689 Other abnormalities of gait and mobility: Secondary | ICD-10-CM | POA: Diagnosis not present

## 2021-09-11 DIAGNOSIS — R413 Other amnesia: Secondary | ICD-10-CM | POA: Insufficient documentation

## 2021-09-11 LAB — CSF CELL COUNT WITH DIFFERENTIAL
RBC Count, CSF: 525 /mm3 — ABNORMAL HIGH
Tube #: 1
WBC, CSF: 1 /mm3 (ref 0–5)

## 2021-09-11 LAB — GLUCOSE, CAPILLARY: Glucose-Capillary: 78 mg/dL (ref 70–99)

## 2021-09-11 LAB — PROTEIN, CSF: Total  Protein, CSF: 39 mg/dL (ref 15–45)

## 2021-09-11 LAB — GLUCOSE, CSF: Glucose, CSF: 50 mg/dL (ref 40–70)

## 2021-09-11 MED ORDER — LIDOCAINE HCL (PF) 1 % IJ SOLN
5.0000 mL | Freq: Once | INTRAMUSCULAR | Status: AC
  Administered 2021-09-11: 5 mL

## 2021-09-11 NOTE — Progress Notes (Signed)
Client is blind but sees shadows

## 2021-09-11 NOTE — Procedures (Signed)
Technically successful fluoro guided LP with opening pressure of 21.5 mmHg at L4-5, 30 cc of clear CSF obtained. No immediate post procedural complication   Full dictation available under imaging section of Epic.  Candiss Norse, PA-C

## 2021-09-14 ENCOUNTER — Telehealth: Payer: Self-pay | Admitting: Neurology

## 2021-09-14 LAB — CSF CULTURE W GRAM STAIN
Culture: NO GROWTH
Gram Stain: NONE SEEN

## 2021-09-14 NOTE — Telephone Encounter (Signed)
ERROR

## 2021-10-01 NOTE — Progress Notes (Signed)
Assessment/Plan:   Lewy Body Dementia   Recommendations  Follow-up in 3 months Start Rivastigmine 1.5 mg, 1 tab qhs for 10 days, then increase to 1.5 mg bid for 2 weeks, then can increase to 3 mg bid . Side effects discussed  Please contact Dr. Anthoney Harada for assessment of decision mental capacity [phone]. You can also call [facility] [phone] for guidance on geriatric dementia issues  PLease feel free to call Arnell Asal, Social Worker at [facility] at [phone] for guidance if placement were needed at the facility. Patient to need 24/7 surveillance for safety  Monitor mood closely.  Once his body tolerates rivastigmine, will consider medications to help with his hallucinations.  Case discussed with Dr. Tomi Likens who reviewed the films and agreed with the plan  Subjective:     87 y.o. year old male with risk factors including  age, hypertension, hyperlipidemia, CAD, anxiety, depression, blindness,  seen today for evaluation of memory loss.  He was unable to do the MoCA.  MMSE during his last visit on 09/02/21 was 15/30 with deficiencies in most items, unable to write a sentence, or copy a picture.  Delayed recall 1/3.  MRI of the brain without contrast on 08/26/2021 performed [facility] revealed possible small subacute ischemic infarction within the right centrum semiovale,  disproportionate ventriculomegaly suspicious for normal pressure hydrocephalus.  As well, brain parenchymal volume loss with possible disproportionate involvement of the occipital lobes were seen suspicious for Lewy Body Dementia.  Large-volume tap, in view of ventricular abnormalities was performed on 09/11/21 with negative results.  Since his last visit, his caretaker Jeneen Rinks, reports over the last 3 weeks, his memory is about the same, but his behavior may have changed.  At one point, he pushes caretaker, howling at the stepsister, walking the whole night, talking to  people that they were not there.  During the night, "he is worse than during the day ".  His daughter reports that he is at the point of needing 24/7 care.  His hallucinations consist of seeing dead relatives, including his wife.  He also sees in the kitchen little children, who do not talk to him, he is trying to touch them, but he states that they go away when he tries to do that  He also reports a dog coming into the house.  He is aware of this hallucinations, he denies auditory issues.  He continues to repeat the same stories and asked the same question, or not knowing why he comes to a room or what he was looking for.  He denies leaving objects in unusual places.  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  He does walk around the house all day long especially at night.  When he sleeps, he has very vivid dreams, and has some REM behavior.  No apparent paranoia.  There are no hygiene concerns.  He does need assistance with bathing because of vision problems.  He is independent of dressing.  His friend places the medications in the pillbox for the last 6 months.  He also manages the finances.  His appetite is somewhat decreased, he favors only liquid foods such as soups.  He also craves ice cream.  He drinks enough amount of water.  He does not like solid foods.  He denies trouble swallowing.  He ambulates with a cane, he denies any falls.  Occasionally, he feels that he  may be "tripping "but he denies hitting his head or having any other injuries.  He continues to have smaller more cautious steps, but there were no changes since his prior visit.  No frank shuffling of the feet.  He denies any headaches.  Occasionally, he has double vision, denies any dizziness, focal numbness or tingling, unilateral weakness, tremors or anosmia.  No history of seizures.  No significant urine incontinence, wears diapers when needed.  At night, he has to get up about 5 times to  urinate.  He denies constipation or diarrhea.      Initial Visit 09/02/21 The patient is seen in neurologic consultation at the request of Aletha Halim., PA-C for the evaluation of memory.  The patient is accompanied by his friend Jeneen Rinks who supplements the history. This is a 87 y.o. year old male who has had memory issues for about 6 months, when he began experiencing hallucinations at night.  However, over the last 3 weeks, he has he began to have some hallucinations, intermittent hallucinations that have become more evident.  "They are not scary ".  He is found mopping the floors in the middle of the night, seeing people who do not respond to his conversations.  He also sees dead relatives, including his wife.  In the kitchen, he sees a little girl who does not talk to him, when he turns to see her, she goes away he says.  At times, he has auditory hallucinations, having conversations with unknown people.  He denies having any other visual hallucinations of objects, or animals.  He does repeat the same stories and asked the same questions, and does not know at times why he came to a room or what he was looking for.  He denies leaving objects in unusual places.  He is a former Development worker, international aid, and he was very active until 6 months ago.  He lives with his friend Jeneen Rinks who has noticed all this changes as well.  His mood is overall "good, with a little depression ".  He denies irritability.  When he sleeps, he has vivid dreams, and may have some REM behavior.  Denies sleepwalking.  No apparent paranoia.  There are no hygiene concerns.  He does need assistance with bathing, because of vision problems.  He is independent of dressing.  His friend places the medications in the pillbox for the last 6 months.  His friend manages the finances.  His appetite is good, denies trouble swallowing.  He does not cook.  He ambulates with a cane, without falls.  Occasionally he may "feel like tripping ", but denies hitting his  head or having any injuries.  His friend reports that the patient steps are smaller, more cautious, especially over the last 3 weeks.  No frank shuffling of the feet.  He denies any headaches.  Occasionally he has double vision, denies any dizziness, focal numbness or tingling, unilateral weakness, tremors or anosmia.  No history of seizures.  He has significant urine incontinence, and especially at night, he gets up 5 times to urinate.  He denies constipation or diarrhea.  He has a history of anosmia, which is present for many years.  No history of OSA, alcohol or tobacco.  Family history remarkable for sister with dementia.  Labs  Vit B12 1019,TSH  1.89, H/H 16.4/51, PLT 149    Current Outpatient Medications  Medication Instructions   acetaminophen (TYLENOL) 500 mg, Oral, As needed   Chlorpheniramine Maleate (WAL-FINATE PO) Oral  cyanocobalamin 2,000 mcg, Oral, Daily   empagliflozin (JARDIANCE) 10 mg, Oral, Daily   furosemide (LASIX) 20 mg, Oral, Daily   metoprolol (TOPROL XL) 200 mg, Oral, Daily, Hold if systolic blood pressure (top blood pressure number) less than 100 mmHg or heart rate less than 60 bpm (pulse).   Polyethyl Glyc-Propyl Glyc PF (SYSTANE HYDRATION PF) 0.4-0.3 % SOLN Ophthalmic   Rivaroxaban (XARELTO) 15 mg, Oral, Daily with supper   spironolactone (ALDACTONE) 25 MG tablet TAKE 1 TABLET BY MOUTH EVERY DAY IN THE MORNING   traZODone (DESYREL) 50 mg, Oral, Daily at bedtime     VITALS:   Vitals:   10/02/21 1058  BP: (!) 152/93  Pulse: 95  Resp: 18  SpO2: 97%  Weight: 166 lb (75.3 kg)    No flowsheet data found.  PHYSICAL EXAM   HEENT:  Normocephalic, atraumatic. The mucous membranes are moist. The superficial temporal arteries are without ropiness or tenderness. Cardiovascular: Regular rate and rhythm. Lungs: Clear to auscultation bilaterally. Neck: There are no carotid bruits noted bilaterally.  NEUROLOGICAL: No flowsheet data found. MMSE - Mini Mental State  Exam 09/02/2021  Orientation to time 2  Orientation to Place 3  Registration 2  Attention/ Calculation 1  Recall 1  Language- name 2 objects 2  Language- repeat 0  Language- follow 3 step command 3  Language- read & follow direction 1  Write a sentence 0  Copy design 0  Total score 15    No flowsheet data found.   Orientation:  Alert and oriented to person, place not to time. No aphasia or dysarthria. Fund of knowledge is reduced. Recent and remote memory impaired Attention and concentration are reduced.  Able to name objects  unable to repeat phrases. Delayed recall  1/3 Cranial nerves: There is good facial symmetry.  R Extraocular muscles are intact and visual fields are full to confrontational testing. L vision field decreased . Speech is fluent and clear. Soft palate rises symmetrically and there is no tongue deviation. Hearing is intact to conversational tone. Tone: Tone is good throughout. Sensation: Sensation is intact to light touch and pinprick throughout. Vibration is intact at the bilateral big toe.There is no extinction with double simultaneous stimulation. There is no sensory dermatomal level identified. Coordination: The patient has some difficulty with RAM's . No difficulties with FNF bilaterally. Normal finger to nose  Motor: Strength is 5/5 in the bilateral upper and lower extremities. There is no pronator drift. There are no fasciculations noted. DTR's: Deep tendon reflexes are 2/4 at the bilateral biceps, triceps, brachioradialis, patella and achilles.  Plantar responses are downgoing bilaterally. Gait and Station: The patient is able to ambulate with a R cane, cautiously The patient is unable to heel toe walk without any difficulty.The patient is able to ambulate in a tandem fashion. The patient is able to stand in the Romberg position.     Thank you for allowing [place] the opportunity to participate in the care of this nice patient. Please do not hesitate to contact us for  any questions or concerns.   Total time spent on today's visit was 30 minutes, including both face-to-face time and nonface-to-face time.  Time included that spent on review of records (prior notes available to me/labs/imaging if pertinent), discussing treatment and goals, answering patient's questions and coordinating care.  Cc:  Aletha Halim., PA-C  Sharene Butters 10/02/2021 11:43 AM

## 2021-10-02 ENCOUNTER — Encounter: Payer: Self-pay | Admitting: Physician Assistant

## 2021-10-02 ENCOUNTER — Other Ambulatory Visit: Payer: Self-pay

## 2021-10-02 ENCOUNTER — Ambulatory Visit (INDEPENDENT_AMBULATORY_CARE_PROVIDER_SITE_OTHER): Payer: Medicare Other | Admitting: Physician Assistant

## 2021-10-02 VITALS — BP 152/93 | HR 95 | Resp 18 | Wt 166.0 lb

## 2021-10-02 DIAGNOSIS — F02818 Dementia in other diseases classified elsewhere, unspecified severity, with other behavioral disturbance: Secondary | ICD-10-CM

## 2021-10-02 DIAGNOSIS — G3183 Dementia with Lewy bodies: Secondary | ICD-10-CM | POA: Diagnosis not present

## 2021-10-02 MED ORDER — RIVASTIGMINE TARTRATE 1.5 MG PO CAPS: 1.5000 mg | ORAL_CAPSULE | Freq: Two times a day (BID) | ORAL | 3 refills | Status: DC

## 2021-10-02 NOTE — Patient Instructions (Addendum)
It was a pleasure to see you today at our office.   Recommendations:  Start rivastigmine (Exelon) 1.5 mg, first take 1 tab at night for 10 days, then increase to 1.5 mg 1 tab at night and  the morning for1 month, then will increase to 3 mg 1 tablet 2 x a day  Follow up in 3 months  Please contact Dr. Anthoney Harada for assessment of decision mental capacity [phone] You can also call [facility] [phone] for guidance on geriatric dementia issues   PLease feel free to call Arnell Asal, Social Worker at [facility] at [phone] for guidance if placement were needed at the facility. Also, you can call [facility] [phone] for guidance    RECOMMENDATIONS FOR ALL PATIENTS WITH MEMORY PROBLEMS: 1. Continue to exercise (Recommend 30 minutes of walking everyday, or 3 hours every week) 2. Increase social interactions - continue going to [place] and enjoy social gatherings with friends and family 3. Eat healthy, avoid fried foods and eat more fruits and vegetables 4. Maintain adequate blood pressure, blood sugar, and blood cholesterol level. Reducing the risk of stroke and cardiovascular disease also helps promoting better memory. 5. Avoid stressful situations. Live a simple life and avoid aggravations. Organize your time and prepare for the next day in anticipation. 6. Sleep well, avoid any interruptions of sleep and avoid any distractions in the bedroom that may interfere with adequate sleep quality 7. Avoid sugar, avoid sweets as there is a strong link between excessive sugar intake, diabetes, and cognitive impairment We discussed the Mediterranean diet, which has been shown to help patients reduce the risk of progressive memory disorders and reduces cardiovascular risk. This includes eating fish, eat fruits and green leafy vegetables, nuts like almonds and hazelnuts, walnuts, and also use olive oil. Avoid fast foods and fried foods as much as possible.  Avoid sweets and sugar as sugar use has been linked to worsening of memory function.  There is always a concern of gradual progression of memory problems. If this is the case, then we may need to adjust level of care according to patient needs. Support, both to the patient and caregiver, should then be put into place.   FALL PRECAUTIONS: Be cautious when walking. Scan the area for obstacles that may increase the risk of trips and falls. When getting up in the mornings, sit up at the edge of the bed for a few minutes before getting out of bed. Consider elevating the bed at the head end to avoid drop of blood pressure when getting up. Walk always in a well-lit room (use night lights in the walls). Avoid area rugs or power cords from appliances in the middle of the walkways. Use a walker or a cane if necessary and consider physical therapy for balance exercise. Get your eyesight checked regularly.  FINANCIAL OVERSIGHT: Supervision, especially oversight when making financial decisions or transactions is also recommended.  HOME SAFETY: Consider the safety of the kitchen when operating appliances like stoves, microwave oven, and blender. Consider having supervision and share cooking responsibilities until no longer able to participate in those. Accidents with firearms and other hazards in the house should be identified and addressed as well.   ABILITY TO BE LEFT ALONE: If patient is unable to contact 911 operator, consider using LifeLine, or when the need is there, arrange for someone to stay with patients. Smoking is a fire hazard, consider supervision or cessation. Risk of wandering should be assessed by caregiver and if  detected at any point, supervision and safe proof recommendations should be instituted.  MEDICATION SUPERVISION: Inability to self-administer medication needs to be constantly addressed. Implement a mechanism to ensure safe administration of the medications.   DRIVING: Regarding driving,  in patients with progressive memory problems, driving will be impaired. We advise to have someone else do the driving if trouble finding directions or if minor accidents are reported. Independent driving assessment is available to determine safety of driving.   If you are interested in the driving assessment, you can contact the following:  [facility] [employer] in [place]  [place] [phone] or [phone]    [place] refers to food and lifestyle choices that are based on the traditions of countries located on the [employer]. This way of eating has been shown to help prevent certain conditions and improve outcomes for people who have chronic diseases, like kidney disease and heart disease. What are tips for following this plan? Lifestyle  Cook and eat meals together with your family, when possible. Drink enough fluid to keep your urine clear or pale yellow. Be physically active every day. This includes: Aerobic exercise like running or swimming. Leisure activities like gardening, walking, or housework. Get 7-8 hours of sleep each night. If recommended by your health care provider, drink red wine in moderation. This means 1 glass a day for nonpregnant women and 2 glasses a day for men. A glass of wine equals 5 oz (150 mL). Reading food labels  Check the serving size of packaged foods. For foods such as rice and pasta, the serving size refers to the amount of cooked product, not dry. Check the total fat in packaged foods. Avoid foods that have saturated fat or trans fats. Check the ingredients list for added sugars, such as corn syrup. Shopping  At the grocery store, buy most of your food from the areas near the walls of the store. This includes: Fresh fruits and vegetables (produce). Grains, beans, nuts, and seeds. Some of these may be  available in unpackaged forms or large amounts (in bulk). Fresh seafood. Poultry and eggs. Low-fat dairy products. Buy whole ingredients instead of prepackaged foods. Buy fresh fruits and vegetables in-season from local farmers markets. Buy frozen fruits and vegetables in resealable bags. If you do not have access to quality fresh seafood, buy precooked frozen shrimp or canned fish, such as tuna, salmon, or sardines. Buy small amounts of raw or cooked vegetables, salads, or olives from the deli or salad bar at your store. Stock your pantry so you always have certain foods on hand, such as olive oil, canned tuna, canned tomatoes, rice, pasta, and beans. Cooking  Cook foods with extra-virgin olive oil instead of using butter or other vegetable oils. Have meat as a side dish, and have vegetables or grains as your main dish. This means having meat in small portions or adding small amounts of meat to foods like pasta or stew. Use beans or vegetables instead of meat in common dishes like chili or lasagna. Experiment with different cooking methods. Try roasting or broiling vegetables instead of steaming or sauteing them. Add frozen vegetables to soups, stews, pasta, or rice. Add nuts or seeds for added healthy fat at each meal. You can add these to yogurt, salads, or vegetable dishes. Marinate fish or vegetables using olive oil, lemon juice, garlic, and fresh herbs. Meal planning  Plan to eat 1 vegetarian  meal one day each week. Try to work up to 2 vegetarian meals, if possible. Eat seafood 2 or more times a week. Have healthy snacks readily available, such as: Vegetable sticks with hummus. Greek yogurt. Fruit and nut trail mix. Eat balanced meals throughout the week. This includes: Fruit: 2-3 servings a day Vegetables: 4-5 servings a day Low-fat dairy: 2 servings a day Fish, poultry, or lean meat: 1 serving a day Beans and legumes: 2 or more servings a week Nuts and seeds: 1-2 servings a  day Whole grains: 6-8 servings a day Extra-virgin olive oil: 3-4 servings a day Limit red meat and sweets to only a few servings a month What are my food choices? Mediterranean diet Recommended Grains: Whole-grain pasta. Brown rice. Bulgar wheat. Polenta. Couscous. Whole-wheat bread. [place]. Vegetables: Artichokes. Beets. Broccoli. Cabbage. Carrots. Eggplant. Green beans. Chard. Kale. Spinach. Onions. Leeks. Peas. Squash. Tomatoes. Peppers. Radishes. Fruits: Apples. Apricots. Avocado. Berries. Bananas. Cherries. Dates. Figs. Grapes. Lemons. Melon. Oranges. Peaches. Plums. Pomegranate. Meats and other protein foods: Beans. Almonds. Sunflower seeds. Pine nuts. Peanuts. Pioneer. Salmon. Scallops. Shrimp. Sneads. Tilapia. Clams. Oysters. Eggs. Dairy: Low-fat milk. Cheese. Greek yogurt. Beverages: Water. Red wine. Herbal tea. Fats and oils: Extra virgin olive oil. Avocado oil. Grape seed oil. Sweets and desserts: [place] yogurt with honey. Baked apples. Poached pears. Trail mix. Seasoning and other foods: Basil. Cilantro. Coriander. Cumin. Mint. Parsley. Sage. Rosemary. Tarragon. Garlic. Oregano. Thyme. Pepper. Balsalmic vinegar. Tahini. Hummus. Tomato sauce. Olives. Mushrooms. Limit these Grains: Prepackaged pasta or rice dishes. Prepackaged cereal with added sugar. Vegetables: Deep fried potatoes (french fries). Fruits: Fruit canned in syrup. Meats and other protein foods: Beef. Pork. Lamb. Poultry with skin. Hot dogs. Berniece Salines. Dairy: Ice cream. Sour cream. Whole milk. Beverages: Juice. Sugar-sweetened soft drinks. Beer. Liquor and spirits. Fats and oils: Butter. Canola oil. Vegetable oil. Beef fat (tallow). Lard. Sweets and desserts: Cookies. Cakes. Pies. Candy. Seasoning and other foods: Mayonnaise. Premade sauces and marinades. The items listed may not be a complete list. Talk with your dietitian about what dietary choices are right for you. Summary The Mediterranean diet includes both  food and lifestyle choices. Eat a variety of fresh fruits and vegetables, beans, nuts, seeds, and whole grains. Limit the amount of red meat and sweets that you eat. Talk with your health care provider about whether it is safe for you to drink red wine in moderation. This means 1 glass a day for nonpregnant women and 2 glasses a day for men. A glass of wine equals 5 oz (150 mL). This information is not intended to replace advice given to you by your health care provider. Make sure you discuss any questions you have with your health care provider. Document Released: 06/10/2016 Document Revised: 07/13/2016 Document Reviewed: 06/10/2016 Elsevier Interactive Patient Education  2017 [employer].

## 2021-10-05 ENCOUNTER — Ambulatory Visit: Payer: Medicare Other | Admitting: Neurology

## 2021-10-08 ENCOUNTER — Other Ambulatory Visit: Payer: Self-pay

## 2021-10-08 ENCOUNTER — Telehealth: Payer: Self-pay | Admitting: Physician Assistant

## 2021-10-08 MED ORDER — RIVASTIGMINE TARTRATE 1.5 MG PO CAPS
1.5000 mg | ORAL_CAPSULE | Freq: Two times a day (BID) | ORAL | 3 refills | Status: DC
Start: 2021-10-08 — End: 2021-11-23

## 2021-10-08 NOTE — Telephone Encounter (Signed)
Sent to Clarise Cruz to sign patient to pick up tomorrow for Exelon to take to the [place]

## 2021-10-08 NOTE — Telephone Encounter (Signed)
Patient friend called, Trey had an appointment on 12/2. The medicine was not sent to the [place]. In order for the medicine to be free, it needs to be sent there. Please call Jeneen Rinks [phone]

## 2021-10-09 NOTE — Telephone Encounter (Signed)
Pt called and informed that script was up front and ready for pick up

## 2021-10-09 NOTE — Telephone Encounter (Signed)
Patient friend Jeneen Rinks called to see if RX was ready to pick up. Exelon. Please call Jeneen Rinks when ready [phone]

## 2021-10-22 ENCOUNTER — Ambulatory Visit: Payer: Medicare Other | Admitting: Cardiology

## 2021-11-10 ENCOUNTER — Encounter: Payer: Self-pay | Admitting: Internal Medicine

## 2021-11-11 ENCOUNTER — Encounter: Payer: Self-pay | Admitting: Podiatry

## 2021-11-11 ENCOUNTER — Ambulatory Visit: Payer: Medicare Other | Admitting: Podiatry

## 2021-11-11 ENCOUNTER — Other Ambulatory Visit: Payer: Self-pay

## 2021-11-11 DIAGNOSIS — M79674 Pain in right toe(s): Secondary | ICD-10-CM | POA: Diagnosis not present

## 2021-11-11 DIAGNOSIS — M79675 Pain in left toe(s): Secondary | ICD-10-CM

## 2021-11-11 DIAGNOSIS — B351 Tinea unguium: Secondary | ICD-10-CM | POA: Diagnosis not present

## 2021-11-11 DIAGNOSIS — F03918 Unspecified dementia, unspecified severity, with other behavioral disturbance: Secondary | ICD-10-CM

## 2021-11-11 NOTE — Progress Notes (Addendum)
This patient returns to my office for at risk foot care. Patient has not been seen since 2019. This patient requires this care by a professional since this patient will be at risk due to having thrombocytopenia and venous stasis.  This patient is unable to cut nails himself since the patient cannot reach his nails.These nails are painful walking and wearing shoes. He presents to the office with his son. This patient presents for at risk foot care today.  General Appearance  Alert, conversant and in no acute stress.  Vascular  Dorsalis pedis  are weakly palpable.  Posterior tibial pulses are not  palpable  bilaterally.  Capillary return is within normal limits  bilaterally. Temperature is within normal limits  bilaterally.  Venous stasis  B/L.  Neurologic  Senn-Weinstein monofilament wire test absent left foot and diminished right foot.bilaterally. Muscle power within normal limits bilaterally.  Nails Thick disfigured discolored nails with subungual debris  from hallux to fifth toes bilaterally. No evidence of bacterial infection or drainage bilaterally.  Orthopedic  No limitations of motion  feet .  No crepitus or effusions noted.  No bony pathology or digital deformities noted.  HAV  B/L.   Skin  normotropic skin with no porokeratosis noted bilaterally.  No signs of infections or ulcers noted.     Onychomycosis  Pain in right toes  Pain in left toes  Consent was obtained for treatment procedures.   Mechanical debridement of nails 1-5  bilaterally performed with a nail nipper.  Filed with dremel without incident.    Return office visit   3 months                   Told patient to return for periodic foot care and evaluation due to potential at risk complications.   Gardiner Barefoot DPM

## 2021-11-23 ENCOUNTER — Telehealth: Payer: Self-pay | Admitting: Physician Assistant

## 2021-11-23 ENCOUNTER — Other Ambulatory Visit: Payer: Self-pay

## 2021-11-23 NOTE — Telephone Encounter (Signed)
1. Which medications need refilled? (List name and dosage, if known) dementia medication (she couldn't remember the name of it). [facility] local [facility]   2. Which pharmacy/location is medication to be sent to? (include street and city if local pharmacy) [place]

## 2021-11-24 ENCOUNTER — Other Ambulatory Visit: Payer: Self-pay | Admitting: Physician Assistant

## 2021-11-24 MED ORDER — RIVASTIGMINE TARTRATE 1.5 MG PO CAPS: 1.5000 mg | ORAL_CAPSULE | Freq: Two times a day (BID) | ORAL | 3 refills | Status: DC

## 2021-11-24 NOTE — Telephone Encounter (Signed)
Hey please resend you printed it thanks

## 2021-11-24 NOTE — Telephone Encounter (Signed)
Left message to confirm actual dose at 7:53am

## 2021-12-31 NOTE — Progress Notes (Incomplete)
Assessment/Plan:   Lewy Body Dementia   Recommendations  Follow-up in 3 months  Continue rivastigmine 3 mg bid Side effects discussed  Please contact Dr. Anthoney Harada for assessment of decision mental capacity [phone]. You can also call [facility] [phone] for guidance on geriatric dementia issues  PLease feel free to call Arnell Asal, Social Worker at [facility] at [phone] for guidance if placement were needed at the facility. Patient to need 24/7 surveillance for safety  Monitor mood closely   Case discussed with Dr. Delice Lesch who agreed with the plan  Subjective:     88 y.o. year old male with risk factors including  age, hypertension, hyperlipidemia, CAD, anxiety, depression, blindness,  seen today for evaluation of memory loss.  He was unable to do the MoCA.  MMSE  on 09/02/21 was 15/30 with deficiencies in most items, unable to write a sentence, or copy a picture.  Delayed recall 1/3.  MRI of the brain without contrast on 08/26/2021 performed at [facility] revealed possible small subacute ischemic infarction within the right centrum semiovale,  disproportionate ventriculomegaly suspicious for normal pressure hydrocephalus.  As well, brain parenchymal volume loss with possible disproportionate involvement of the occipital lobes were seen suspicious for Lewy Body Dementia.  Large-volume tap, in view of ventricular abnormalities was performed on 09/11/21 with negative results.     Since his last visit, his caretaker Jeneen Rinks, reports over the last 3 weeks, his memory is about the same, but his behavior may have changed.   At one point, he pushes caretaker, howling at the stepsister, walking the whole night, talking to people that they were not there.   During the night, "he is worse than during the day ".   His daughter reports that he is at the point of needing 24/7 care.   His hallucinations consist of seeing dead relatives, including  his wife.  He also sees in the kitchen little children, who do not talk to him, he is trying to touch them, but he states that they go away when he tries to do that  He also reports a dog coming into the house.  He is aware of this hallucinations, he denies auditory issues.   He continues to repeat the same stories and asked the same question, or not knowing why he comes to a room or what he was looking for.   He denies leaving objects in unusual places.   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   He does walk around the house all day long especially at night.   When he sleeps, he has very vivid dreams, and has some REM behavior.  No apparent paranoia.   There are no hygiene concerns.   He does need assistance with bathing because of vision problems.   He is independent of dressing.   His friend places the medications in the pillbox for the last 6 months.   He also manages the finances.   His appetite is somewhat decreased, he favors only liquid foods such as soups.  He also craves ice cream.  He drinks enough amount of water.  He does not like solid foods.   He denies trouble swallowing.   He ambulates with a cane, he denies any falls.   Occasionally, he feels that he may be "tripping "but he denies hitting his head or having any other injuries.   He continues to have  smaller more cautious steps, but there were no changes since his prior visit.  No frank shuffling of the feet.   He denies any headaches.   Occasionally, he has double vision, denies any dizziness, focal numbness or tingling, unilateral weakness, tremors or anosmia.   No history of seizures.  No significant urine incontinence, wears diapers when needed.  At night, he has to get up about 5 times to urinate.   He denies constipation or diarrhea.      Initial Visit 09/02/21 The patient is seen in neurologic consultation at the request of Aletha Halim., PA-C for the  evaluation of memory.  The patient is accompanied by his friend Jeneen Rinks who supplements the history. This is a 88 y.o. year old male who has had memory issues for about 6 months, when he began experiencing hallucinations at night.  However, over the last 3 weeks, he has he began to have some hallucinations, intermittent hallucinations that have become more evident.  "They are not scary ".  He is found mopping the floors in the middle of the night, seeing people who do not respond to his conversations.  He also sees dead relatives, including his wife.  In the kitchen, he sees a little girl who does not talk to him, when he turns to see her, she goes away he says.  At times, he has auditory hallucinations, having conversations with unknown people.  He denies having any other visual hallucinations of objects, or animals.  He does repeat the same stories and asked the same questions, and does not know at times why he came to a room or what he was looking for.  He denies leaving objects in unusual places.  He is a former Development worker, international aid, and he was very active until 6 months ago.  He lives with his friend Jeneen Rinks who has noticed all this changes as well.  His mood is overall "good, with a little depression ".  He denies irritability.  When he sleeps, he has vivid dreams, and may have some REM behavior.  Denies sleepwalking.  No apparent paranoia.  There are no hygiene concerns.  He does need assistance with bathing, because of vision problems.  He is independent of dressing.  His friend places the medications in the pillbox for the last 6 months.  His friend manages the finances.  His appetite is good, denies trouble swallowing.  He does not cook.  He ambulates with a cane, without falls.  Occasionally he may "feel like tripping ", but denies hitting his head or having any injuries.  His friend reports that the patient steps are smaller, more cautious, especially over the last 3 weeks.  No frank shuffling of the feet.  He denies  any headaches.  Occasionally he has double vision, denies any dizziness, focal numbness or tingling, unilateral weakness, tremors or anosmia.  No history of seizures.  He has significant urine incontinence, and especially at night, he gets up 5 times to urinate.  He denies constipation or diarrhea.  He has a history of anosmia, which is present for many years.  No history of OSA, alcohol or tobacco.  Family history remarkable for sister with dementia.  Labs  Vit B12 1019,TSH  1.89, H/H 16.4/51, PLT 149    Current Outpatient Medications  Medication Instructions   acetaminophen (TYLENOL) 500 mg, Oral, As needed   Chlorpheniramine Maleate (WAL-FINATE PO) Oral   cyanocobalamin 2,000 mcg, Oral, Daily   empagliflozin (JARDIANCE) 10 mg, Oral, Daily  furosemide (LASIX) 20 mg, Oral, Daily   metoprolol (TOPROL XL) 200 mg, Oral, Daily, Hold if systolic blood pressure (top blood pressure number) less than 100 mmHg or heart rate less than 60 bpm (pulse).   Polyethyl Glyc-Propyl Glyc PF (SYSTANE HYDRATION PF) 0.4-0.3 % SOLN Ophthalmic   Rivaroxaban (XARELTO) 15 mg, Oral, Daily with supper   rivastigmine (EXELON) 1.5 mg, Oral, 2 times daily, Start rivastigmine (Exelon) 1.5 mg, first take 1 tab at night for 10 days, then increase to 1.5 mg 1 tab at night and  the morning for1 month, then will increase to 3 mg 1 tablet 2 x a day   spironolactone (ALDACTONE) 25 MG tablet TAKE 1 TABLET BY MOUTH EVERY DAY IN THE MORNING   traZODone (DESYREL) 50 mg, Oral, Daily at bedtime     VITALS:   There were no vitals filed for this visit.   No flowsheet data found.  PHYSICAL EXAM   HEENT:  Normocephalic, atraumatic. The mucous membranes are moist. The superficial temporal arteries are without ropiness or tenderness. Cardiovascular: Regular rate and rhythm. Lungs: Clear to auscultation bilaterally. Neck: There are no carotid bruits noted bilaterally.  NEUROLOGICAL: No flowsheet data found. MMSE - Mini Mental  State Exam 09/02/2021  Orientation to time 2  Orientation to Place 3  Registration 2  Attention/ Calculation 1  Recall 1  Language- name 2 objects 2  Language- repeat 0  Language- follow 3 step command 3  Language- read & follow direction 1  Write a sentence 0  Copy design 0  Total score 15    No flowsheet data found.   Orientation:  Alert and oriented to person, place not to time. No aphasia or dysarthria. Fund of knowledge is reduced. Recent and remote memory impaired Attention and concentration are reduced.  Able to name objects  unable to repeat phrases. Delayed recall  1/3 Cranial nerves: There is good facial symmetry.  R Extraocular muscles are intact and visual fields are full to confrontational testing. L vision field decreased . Speech is fluent and clear. Soft palate rises symmetrically and there is no tongue deviation. Hearing is intact to conversational tone. Tone: Tone is good throughout. Sensation: Sensation is intact to light touch and pinprick throughout. Vibration is intact at the bilateral big toe.There is no extinction with double simultaneous stimulation. There is no sensory dermatomal level identified. Coordination: The patient has some difficulty with RAM's . No difficulties with FNF bilaterally. Normal finger to nose  Motor: Strength is 5/5 in the bilateral upper and lower extremities. There is no pronator drift. There are no fasciculations noted. DTR's: Deep tendon reflexes are 2/4 at the bilateral biceps, triceps, brachioradialis, patella and achilles.  Plantar responses are downgoing bilaterally. Gait and Station: The patient is able to ambulate with a R cane, cautiously The patient is unable to heel toe walk without any difficulty.The patient is able to ambulate in a tandem fashion. The patient is able to stand in the Romberg position.     Thank you for allowing [place] the opportunity to participate in the care of this nice patient. Please do not hesitate to contact  us for any questions or concerns.   Total time spent on today's visit was 30 minutes, including both face-to-face time and nonface-to-face time.  Time included that spent on review of records (prior notes available to me/labs/imaging if pertinent), discussing treatment and goals, answering patient's questions and coordinating care.  Cc:  Aletha Halim., PA-C  Sharene Butters 12/31/2021  7:11 PM

## 2022-01-01 ENCOUNTER — Ambulatory Visit: Payer: Medicare Other | Admitting: Physician Assistant

## 2022-01-01 ENCOUNTER — Encounter: Payer: Self-pay | Admitting: Physician Assistant

## 2022-01-01 DIAGNOSIS — Z029 Encounter for administrative examinations, unspecified: Secondary | ICD-10-CM

## 2022-01-21 ENCOUNTER — Telehealth: Payer: Self-pay

## 2022-01-21 NOTE — Telephone Encounter (Signed)
Spoke with patient's daughter Madlyn Frankel regarding Palliative Care consult. She states they have a hospice evaluation with [facility] today and requested a call back tomorrow to see if patient was admitted to hospice.  ?

## 2022-01-22 ENCOUNTER — Telehealth: Payer: Self-pay

## 2022-01-22 NOTE — Telephone Encounter (Signed)
Spoke with [facility] to verify if patient was admitted to hospice. He was admitted on 3/23. Will cancel palliative referral.  ?

## 2022-02-10 ENCOUNTER — Ambulatory Visit: Payer: Medicare Other | Admitting: Podiatry

## 2022-03-03 ENCOUNTER — Ambulatory Visit: Payer: Medicare Other | Admitting: Podiatry

## 2022-03-06 ENCOUNTER — Emergency Department (HOSPITAL_COMMUNITY): Payer: Medicare Other

## 2022-03-06 ENCOUNTER — Inpatient Hospital Stay (HOSPITAL_COMMUNITY)
Admission: EM | Admit: 2022-03-06 | Discharge: 2022-03-10 | DRG: 291 | Disposition: A | Discharge status: Hospice | Payer: Medicare Other | Attending: Internal Medicine | Admitting: Internal Medicine

## 2022-03-06 ENCOUNTER — Other Ambulatory Visit: Payer: Self-pay

## 2022-03-06 ENCOUNTER — Encounter (HOSPITAL_COMMUNITY): Payer: Self-pay | Admitting: Emergency Medicine

## 2022-03-06 ENCOUNTER — Observation Stay (HOSPITAL_COMMUNITY): Payer: Medicare Other

## 2022-03-06 DIAGNOSIS — R609 Edema, unspecified: Secondary | ICD-10-CM

## 2022-03-06 DIAGNOSIS — I472 Ventricular tachycardia, unspecified: Secondary | ICD-10-CM | POA: Diagnosis not present

## 2022-03-06 DIAGNOSIS — Z79899 Other long term (current) drug therapy: Secondary | ICD-10-CM

## 2022-03-06 DIAGNOSIS — N179 Acute kidney failure, unspecified: Secondary | ICD-10-CM | POA: Diagnosis not present

## 2022-03-06 DIAGNOSIS — D72819 Decreased white blood cell count, unspecified: Secondary | ICD-10-CM | POA: Diagnosis present

## 2022-03-06 DIAGNOSIS — I5043 Acute on chronic combined systolic (congestive) and diastolic (congestive) heart failure: Secondary | ICD-10-CM | POA: Diagnosis present

## 2022-03-06 DIAGNOSIS — E538 Deficiency of other specified B group vitamins: Secondary | ICD-10-CM | POA: Diagnosis present

## 2022-03-06 DIAGNOSIS — G3183 Dementia with Lewy bodies: Secondary | ICD-10-CM | POA: Diagnosis present

## 2022-03-06 DIAGNOSIS — K589 Irritable bowel syndrome without diarrhea: Secondary | ICD-10-CM | POA: Diagnosis present

## 2022-03-06 DIAGNOSIS — H409 Unspecified glaucoma: Secondary | ICD-10-CM | POA: Diagnosis present

## 2022-03-06 DIAGNOSIS — R188 Other ascites: Secondary | ICD-10-CM | POA: Diagnosis present

## 2022-03-06 DIAGNOSIS — I130 Hypertensive heart and chronic kidney disease with heart failure and stage 1 through stage 4 chronic kidney disease, or unspecified chronic kidney disease: Principal | ICD-10-CM | POA: Diagnosis present

## 2022-03-06 DIAGNOSIS — I4821 Permanent atrial fibrillation: Secondary | ICD-10-CM | POA: Diagnosis present

## 2022-03-06 DIAGNOSIS — Z7901 Long term (current) use of anticoagulants: Secondary | ICD-10-CM

## 2022-03-06 DIAGNOSIS — I420 Dilated cardiomyopathy: Secondary | ICD-10-CM | POA: Diagnosis present

## 2022-03-06 DIAGNOSIS — D631 Anemia in chronic kidney disease: Secondary | ICD-10-CM | POA: Diagnosis present

## 2022-03-06 DIAGNOSIS — K219 Gastro-esophageal reflux disease without esophagitis: Secondary | ICD-10-CM | POA: Diagnosis present

## 2022-03-06 DIAGNOSIS — E876 Hypokalemia: Secondary | ICD-10-CM | POA: Diagnosis not present

## 2022-03-06 DIAGNOSIS — I5022 Chronic systolic (congestive) heart failure: Secondary | ICD-10-CM | POA: Diagnosis present

## 2022-03-06 DIAGNOSIS — J9811 Atelectasis: Secondary | ICD-10-CM | POA: Diagnosis present

## 2022-03-06 DIAGNOSIS — N400 Enlarged prostate without lower urinary tract symptoms: Secondary | ICD-10-CM | POA: Diagnosis present

## 2022-03-06 DIAGNOSIS — I5023 Acute on chronic systolic (congestive) heart failure: Secondary | ICD-10-CM | POA: Diagnosis not present

## 2022-03-06 DIAGNOSIS — E669 Obesity, unspecified: Secondary | ICD-10-CM | POA: Diagnosis present

## 2022-03-06 DIAGNOSIS — Z66 Do not resuscitate: Secondary | ICD-10-CM | POA: Diagnosis present

## 2022-03-06 DIAGNOSIS — I5082 Biventricular heart failure: Secondary | ICD-10-CM | POA: Diagnosis present

## 2022-03-06 DIAGNOSIS — Z87891 Personal history of nicotine dependence: Secondary | ICD-10-CM

## 2022-03-06 DIAGNOSIS — M7989 Other specified soft tissue disorders: Secondary | ICD-10-CM

## 2022-03-06 DIAGNOSIS — E7800 Pure hypercholesterolemia, unspecified: Secondary | ICD-10-CM | POA: Diagnosis present

## 2022-03-06 DIAGNOSIS — Z7984 Long term (current) use of oral hypoglycemic drugs: Secondary | ICD-10-CM

## 2022-03-06 DIAGNOSIS — I4891 Unspecified atrial fibrillation: Secondary | ICD-10-CM | POA: Diagnosis not present

## 2022-03-06 DIAGNOSIS — E739 Lactose intolerance, unspecified: Secondary | ICD-10-CM | POA: Diagnosis present

## 2022-03-06 DIAGNOSIS — Z6830 Body mass index (BMI) 30.0-30.9, adult: Secondary | ICD-10-CM

## 2022-03-06 DIAGNOSIS — F02818 Dementia in other diseases classified elsewhere, unspecified severity, with other behavioral disturbance: Secondary | ICD-10-CM | POA: Diagnosis present

## 2022-03-06 DIAGNOSIS — K2100 Gastro-esophageal reflux disease with esophagitis, without bleeding: Secondary | ICD-10-CM | POA: Diagnosis present

## 2022-03-06 DIAGNOSIS — G4733 Obstructive sleep apnea (adult) (pediatric): Secondary | ICD-10-CM | POA: Diagnosis present

## 2022-03-06 DIAGNOSIS — W19XXXA Unspecified fall, initial encounter: Secondary | ICD-10-CM | POA: Diagnosis present

## 2022-03-06 DIAGNOSIS — E440 Moderate protein-calorie malnutrition: Secondary | ICD-10-CM | POA: Insufficient documentation

## 2022-03-06 DIAGNOSIS — S7011XA Contusion of right thigh, initial encounter: Secondary | ICD-10-CM | POA: Diagnosis present

## 2022-03-06 DIAGNOSIS — N1832 Chronic kidney disease, stage 3b: Secondary | ICD-10-CM | POA: Diagnosis present

## 2022-03-06 DIAGNOSIS — R601 Generalized edema: Secondary | ICD-10-CM | POA: Diagnosis not present

## 2022-03-06 DIAGNOSIS — I083 Combined rheumatic disorders of mitral, aortic and tricuspid valves: Secondary | ICD-10-CM | POA: Diagnosis present

## 2022-03-06 DIAGNOSIS — I2510 Atherosclerotic heart disease of native coronary artery without angina pectoris: Secondary | ICD-10-CM | POA: Diagnosis present

## 2022-03-06 DIAGNOSIS — R627 Adult failure to thrive: Secondary | ICD-10-CM | POA: Diagnosis present

## 2022-03-06 DIAGNOSIS — I493 Ventricular premature depolarization: Secondary | ICD-10-CM | POA: Diagnosis not present

## 2022-03-06 DIAGNOSIS — I10 Essential (primary) hypertension: Secondary | ICD-10-CM | POA: Diagnosis present

## 2022-03-06 DIAGNOSIS — Z8249 Family history of ischemic heart disease and other diseases of the circulatory system: Secondary | ICD-10-CM

## 2022-03-06 LAB — URINALYSIS, ROUTINE W REFLEX MICROSCOPIC
Bilirubin Urine: NEGATIVE
Glucose, UA: 50 mg/dL — AB
Hgb urine dipstick: NEGATIVE
Ketones, ur: NEGATIVE mg/dL
Leukocytes,Ua: NEGATIVE
Nitrite: NEGATIVE
Protein, ur: 100 mg/dL — AB
Specific Gravity, Urine: 1.018 (ref 1.005–1.030)
pH: 5 (ref 5.0–8.0)

## 2022-03-06 LAB — COMPREHENSIVE METABOLIC PANEL
ALT: 7 U/L (ref 0–44)
AST: 18 U/L (ref 15–41)
Albumin: 3 g/dL — ABNORMAL LOW (ref 3.5–5.0)
Alkaline Phosphatase: 67 U/L (ref 38–126)
Anion gap: 8 (ref 5–15)
BUN: 23 mg/dL (ref 8–23)
CO2: 27 mmol/L (ref 22–32)
Calcium: 8.8 mg/dL — ABNORMAL LOW (ref 8.9–10.3)
Chloride: 106 mmol/L (ref 98–111)
Creatinine, Ser: 1.75 mg/dL — ABNORMAL HIGH (ref 0.61–1.24)
GFR, Estimated: 38 mL/min — ABNORMAL LOW (ref >60)
Glucose, Bld: 96 mg/dL (ref 70–99)
Potassium: 3.3 mmol/L — ABNORMAL LOW (ref 3.5–5.1)
Sodium: 141 mmol/L (ref 135–145)
Total Bilirubin: 2.3 mg/dL — ABNORMAL HIGH (ref 0.3–1.2)
Total Protein: 5.9 g/dL — ABNORMAL LOW (ref 6.5–8.1)

## 2022-03-06 LAB — CBC WITH DIFFERENTIAL/PLATELET
Abs Immature Granulocytes: 0.01 10*3/uL (ref 0.00–0.07)
Basophils Absolute: 0 10*3/uL (ref 0.0–0.1)
Basophils Relative: 1 %
Eosinophils Absolute: 0 10*3/uL (ref 0.0–0.5)
Eosinophils Relative: 1 %
HCT: 32.3 % — ABNORMAL LOW (ref 39.0–52.0)
Hemoglobin: 9.6 g/dL — ABNORMAL LOW (ref 13.0–17.0)
Immature Granulocytes: 0 %
Lymphocytes Relative: 33 %
Lymphs Abs: 1.1 10*3/uL (ref 0.7–4.0)
MCH: 30.9 pg (ref 26.0–34.0)
MCHC: 29.7 g/dL — ABNORMAL LOW (ref 30.0–36.0)
MCV: 103.9 fL — ABNORMAL HIGH (ref 80.0–100.0)
Monocytes Absolute: 0.4 10*3/uL (ref 0.1–1.0)
Monocytes Relative: 13 %
Neutro Abs: 1.7 10*3/uL (ref 1.7–7.7)
Neutrophils Relative %: 52 %
Platelets: 153 10*3/uL (ref 150–400)
RBC: 3.11 MIL/uL — ABNORMAL LOW (ref 4.22–5.81)
RDW: 14.7 % (ref 11.5–15.5)
WBC: 3.3 10*3/uL — ABNORMAL LOW (ref 4.0–10.5)
nRBC: 0 % (ref 0.0–0.2)

## 2022-03-06 LAB — POC OCCULT BLOOD, ED: Fecal Occult Bld: NEGATIVE

## 2022-03-06 LAB — TROPONIN I (HIGH SENSITIVITY)
Troponin I (High Sensitivity): 17 ng/L (ref <18)
Troponin I (High Sensitivity): 18 ng/L — ABNORMAL HIGH (ref <18)

## 2022-03-06 LAB — LIPASE, BLOOD: Lipase: 24 U/L (ref 11–51)

## 2022-03-06 LAB — BRAIN NATRIURETIC PEPTIDE: B Natriuretic Peptide: 605.5 pg/mL — ABNORMAL HIGH (ref 0.0–100.0)

## 2022-03-06 MED ORDER — METOPROLOL TARTRATE 25 MG PO TABS
25.0000 mg | ORAL_TABLET | Freq: Two times a day (BID) | ORAL | Status: DC
  Administered 2022-03-06 – 2022-03-10 (×8): 25 mg via ORAL
  Filled 2022-03-06 (×8): qty 1

## 2022-03-06 MED ORDER — SODIUM CHLORIDE 0.9% FLUSH
3.0000 mL | Freq: Two times a day (BID) | INTRAVENOUS | Status: DC
  Administered 2022-03-06 – 2022-03-09 (×6): 3 mL via INTRAVENOUS

## 2022-03-06 MED ORDER — DILTIAZEM HCL 60 MG PO TABS
60.0000 mg | ORAL_TABLET | Freq: Three times a day (TID) | ORAL | Status: DC
  Administered 2022-03-06: 60 mg via ORAL
  Filled 2022-03-06 (×3): qty 1

## 2022-03-06 MED ORDER — DILTIAZEM HCL-DEXTROSE 125-5 MG/125ML-% IV SOLN (PREMIX)
5.0000 mg/h | INTRAVENOUS | Status: DC
  Administered 2022-03-06: 5 mg/h via INTRAVENOUS
  Filled 2022-03-06: qty 125

## 2022-03-06 MED ORDER — FUROSEMIDE 10 MG/ML IJ SOLN
20.0000 mg | Freq: Four times a day (QID) | INTRAMUSCULAR | Status: DC
  Administered 2022-03-06 – 2022-03-09 (×10): 20 mg via INTRAVENOUS
  Filled 2022-03-06 (×10): qty 2

## 2022-03-06 MED ORDER — SPIRONOLACTONE 25 MG PO TABS
50.0000 mg | ORAL_TABLET | Freq: Every day | ORAL | Status: DC
  Administered 2022-03-07 – 2022-03-08 (×2): 50 mg via ORAL
  Filled 2022-03-06 (×2): qty 2

## 2022-03-06 MED ORDER — BISACODYL 5 MG PO TBEC: 5.0000 mg | DELAYED_RELEASE_TABLET | Freq: Every day | ORAL | Status: DC | PRN

## 2022-03-06 MED ORDER — IOHEXOL 350 MG/ML SOLN
75.0000 mL | Freq: Once | INTRAVENOUS | Status: AC | PRN
  Administered 2022-03-06: 75 mL via INTRAVENOUS

## 2022-03-06 MED ORDER — ONDANSETRON HCL 4 MG/2ML IJ SOLN: 4.0000 mg | Freq: Four times a day (QID) | INTRAMUSCULAR | Status: DC | PRN

## 2022-03-06 MED ORDER — FUROSEMIDE 10 MG/ML IJ SOLN
60.0000 mg | Freq: Once | INTRAMUSCULAR | Status: DC
Start: 2022-03-06 — End: 2022-03-06

## 2022-03-06 MED ORDER — POLYETHYLENE GLYCOL 3350 17 G PO PACK: 17.0000 g | PACK | Freq: Every day | ORAL | Status: DC | PRN

## 2022-03-06 MED ORDER — TRAMADOL HCL 50 MG PO TABS: 50.0000 mg | ORAL_TABLET | Freq: Three times a day (TID) | ORAL | Status: DC | PRN

## 2022-03-06 MED ORDER — DOCUSATE SODIUM 100 MG PO CAPS
100.0000 mg | ORAL_CAPSULE | Freq: Two times a day (BID) | ORAL | Status: DC
  Administered 2022-03-06 – 2022-03-10 (×8): 100 mg via ORAL
  Filled 2022-03-06 (×8): qty 1

## 2022-03-06 MED ORDER — OXYCODONE HCL 5 MG PO TABS: 5.0000 mg | ORAL_TABLET | ORAL | Status: DC | PRN

## 2022-03-06 MED ORDER — ONDANSETRON HCL 4 MG PO TABS: 4.0000 mg | ORAL_TABLET | Freq: Four times a day (QID) | ORAL | Status: DC | PRN

## 2022-03-06 MED ORDER — DILTIAZEM LOAD VIA INFUSION
10.0000 mg | Freq: Once | INTRAVENOUS | Status: AC
  Administered 2022-03-06: 10 mg via INTRAVENOUS

## 2022-03-06 MED ORDER — QUETIAPINE FUMARATE 25 MG PO TABS
25.0000 mg | ORAL_TABLET | Freq: Every day | ORAL | Status: DC
  Administered 2022-03-06 – 2022-03-09 (×4): 25 mg via ORAL
  Filled 2022-03-06 (×4): qty 1

## 2022-03-06 MED ORDER — POTASSIUM CHLORIDE CRYS ER 20 MEQ PO TBCR
40.0000 meq | EXTENDED_RELEASE_TABLET | Freq: Two times a day (BID) | ORAL | Status: DC
  Administered 2022-03-06 – 2022-03-10 (×7): 40 meq via ORAL
  Filled 2022-03-06 (×8): qty 2

## 2022-03-06 MED ORDER — ACETAMINOPHEN 650 MG RE SUPP: 650.0000 mg | Freq: Four times a day (QID) | RECTAL | Status: DC | PRN

## 2022-03-06 MED ORDER — HYDRALAZINE HCL 20 MG/ML IJ SOLN: 5.0000 mg | INTRAMUSCULAR | Status: DC | PRN

## 2022-03-06 MED ORDER — ACETAMINOPHEN 325 MG PO TABS
650.0000 mg | ORAL_TABLET | Freq: Four times a day (QID) | ORAL | Status: DC | PRN
  Administered 2022-03-09: 650 mg via ORAL
  Filled 2022-03-06: qty 2

## 2022-03-06 MED ORDER — MIRTAZAPINE 7.5 MG PO TABS
7.5000 mg | ORAL_TABLET | Freq: Every day | ORAL | Status: DC
  Administered 2022-03-07 – 2022-03-10 (×4): 7.5 mg via ORAL
  Filled 2022-03-06 (×5): qty 1

## 2022-03-06 NOTE — Progress Notes (Signed)
Paged provider, pt having several episodes of 4 to 5 beat runs of v tach. Pt asymptomatic.  ?

## 2022-03-06 NOTE — Progress Notes (Signed)
VASCULAR LAB ? ? ? ?Left upper extremity venous duplex has been performed. ? ?See CV proc for preliminary results. ? ?Messaged results to Dr. Ronnald Nian via secure chat ? ?Pleasant Bensinger, RVT ?03/06/2022, 1:22 PM ? ?

## 2022-03-06 NOTE — ED Provider Notes (Signed)
?[facility] ?Provider Note ? ? ?CSN: [id number] ?Arrival date & time: 03/06/22  1035 ? ?  ? ?History ? ?Chief Complaint  ?Patient presents with  ? Illness  ? ? ?Ronald [name] is a 88 y.o. male. ? ?Patient here with swelling of his left arm and swelling of his right leg for the last several days.  Finished antibiotic for may be infection on his left foot a week or so ago.  He denies any fevers or chills.  He denies any shortness of breath.  States he has a history of A-fib and is supposed to be on blood thinner but he was off of that blood thinner maybe for few days last week.  No history of clots.  Denies any abdominal pain, chest pain, headaches.  History of high cholesterol, heart failure, IBS, atrial fibrillation ? ? ? ?  ? ?Home Medications ?Prior to Admission medications   ?Medication Sig Start Date End Date Taking? Authorizing Provider  ?acetaminophen (TYLENOL) 500 MG tablet Take 500 mg by mouth as needed.    [provider]  ?Chlorpheniramine Maleate (WAL-FINATE PO) Take by mouth.    [provider]  ?cyanocobalamin 2000 MCG tablet Take 2,000 mcg by mouth daily.    [provider]  ?empagliflozin (JARDIANCE) 10 MG TABS tablet Take 1 tablet (10 mg total) by mouth daily. 05/20/21   Tolia, Sunit, DO  ?furosemide (LASIX) 20 MG tablet Take 20 mg by mouth daily.    [provider]  ?metoprolol (TOPROL XL) 200 MG 24 hr tablet Take 1 tablet (200 mg total) by mouth daily. Hold if systolic blood pressure (top blood pressure number) less than 100 mmHg or heart rate less than 60 bpm (pulse). 07/27/21 10/25/21  Rex Kras, DO  ?Polyethyl Glyc-Propyl Glyc PF (SYSTANE HYDRATION PF) 0.4-0.3 % SOLN Apply to eye.    [provider]  ?Rivaroxaban (XARELTO) 15 MG TABS tablet Take 1 tablet (15 mg total) by mouth daily with supper. 04/29/21 09/11/21  Tolia, Sunit, DO  ?rivastigmine (EXELON) 1.5 MG capsule TAKE 1 CAPSULE (1.5 MG TOTAL) BY MOUTH 2  (TWO) TIMES DAILY. START RIVASTIGMINE (EXELON) 1.5 MG, FIRST TAKE 1 TAB AT NIGHT FOR 10 DAYS, THEN INCREASE TO 1.5 MG 1 TAB AT NIGHT AND THE MORNING FOR1 MONTH, THEN WILL INCREASE TO 3 MG 1 TABLET 2 X A DAY 11/24/21   Shawn Route, Coralee Pesa, PA-C  ?spironolactone (ALDACTONE) 25 MG tablet TAKE 1 TABLET BY MOUTH EVERY DAY IN THE MORNING 05/19/21   Tolia, Sunit, DO  ?traZODone (DESYREL) 50 MG tablet Take 50 mg by mouth at bedtime.    [provider]  ?   ? ?Allergies    ?Lactose intolerance (gi)   ? ?Review of Systems   ?Review of Systems ? ?Physical Exam ?Updated Vital Signs ? ?ED Triage Vitals  ?Enc Vitals Group  ?   BP 03/06/22 1040 (!) 174/81  ?   Pulse Rate 03/06/22 1040 74  ?   Resp 03/06/22 1040 20  ?   Temp 03/06/22 1040 97.7 ?F (36.5 ?C)  ?   Temp Source 03/06/22 1040 Oral  ?   SpO2 03/06/22 1040 100 %  ?   Weight --   ?   Height --   ?   Head Circumference --   ?   Peak Flow --   ?   Pain Score 03/06/22 1041 0  ?   Pain Loc --   ?  Pain Edu? --   ?   Excl. in [place]? --   ? ? ?Physical Exam ?Constitutional:   ?   General: He is not in acute distress. ?   Appearance: He is not ill-appearing.  ?HENT:  ?   Head: Normocephalic and atraumatic.  ?   Nose: Nose normal.  ?   Mouth/Throat:  ?   Mouth: Mucous membranes are moist.  ?Eyes:  ?   Extraocular Movements: Extraocular movements intact.  ?   Pupils: Pupils are equal, round, and reactive to light.  ?Cardiovascular:  ?   Pulses: Normal pulses.  ?   Heart sounds: Normal heart sounds.  ?Abdominal:  ?   General: Abdomen is flat.  ?   Tenderness: There is no abdominal tenderness. There is no guarding.  ?Musculoskeletal:     ?   General: Normal range of motion.  ?   Cervical back: Normal range of motion.  ?   Right lower leg: Edema (3+ pitting edema) present.  ?   Left lower leg: No edema.  ?   Comments: 2-3+ pitting edema in the left upper extremity  ?Skin: ?   Capillary Refill: Capillary refill takes less than 2 seconds.  ?Neurological:  ?   General: No focal deficit  present.  ?   Mental Status: He is alert and oriented to person, place, and time.  ?   Sensory: No sensory deficit.  ?   Motor: No weakness.  ? ? ?ED Results / Procedures / Treatments   ?Labs ?(all labs ordered are listed, but only abnormal results are displayed) ?Labs Reviewed  ?COMPREHENSIVE METABOLIC PANEL - Abnormal; Notable for the following components:  ?    Result Value  ? Potassium 3.3 (*)   ? Creatinine, Ser 1.75 (*)   ? Calcium 8.8 (*)   ? Total Protein 5.9 (*)   ? Albumin 3.0 (*)   ? Total Bilirubin 2.3 (*)   ? GFR, Estimated 38 (*)   ? All other components within normal limits  ?CBC WITH DIFFERENTIAL/PLATELET - Abnormal; Notable for the following components:  ? WBC 3.3 (*)   ? RBC 3.11 (*)   ? Hemoglobin 9.6 (*)   ? HCT 32.3 (*)   ? MCV 103.9 (*)   ? MCHC 29.7 (*)   ? All other components within normal limits  ?BRAIN NATRIURETIC PEPTIDE - Abnormal; Notable for the following components:  ? B Natriuretic Peptide 605.5 (*)   ? All other components within normal limits  ?LIPASE, BLOOD  ?URINALYSIS, ROUTINE W REFLEX MICROSCOPIC  ?POC OCCULT BLOOD, ED  ?TROPONIN I (HIGH SENSITIVITY)  ?TROPONIN I (HIGH SENSITIVITY)  ? ? ?EKG ?EKG Interpretation ? ?Date/Time:  Saturday Mar 06 2022 12:46:18 EDT ?Ventricular Rate:  161 ?PR Interval:    ?QRS Duration: 111 ?QT Interval:  335 ?QTC Calculation: 491 ?R Axis:   73 ?Text Interpretation: Atrial fibrillation with rapid V-rate Ventricular tachycardia, unsustained Consider anterior infarct Nonspecific T abnormalities, lateral leads Confirmed by Ronnald Nian, Dakoda Laventure ([id number]) on 03/06/2022 1:02:57 PM ? ?Radiology ?CT Angio Chest PE W and/or Wo Contrast ? ?Result Date: 03/06/2022 ?CLINICAL DATA:  Shortness of breath. EXAM: CT ANGIOGRAPHY CHEST WITH CONTRAST TECHNIQUE: Multidetector CT imaging of the chest was performed using the standard protocol during bolus administration of intravenous contrast. Multiplanar CT image reconstructions and MIPs were obtained to evaluate the vascular anatomy.  RADIATION DOSE REDUCTION: This exam was performed according to the departmental dose-optimization program which includes automated exposure control, adjustment of the  mA and/or kV according to patient size and/or use of iterative reconstruction technique. CONTRAST:  69m OMNIPAQUE IOHEXOL 350 MG/ML SOLN COMPARISON:  None Available. FINDINGS: Cardiovascular: Moderate cardiomegaly with right atrial enlargement. Mild calcified plaque over the left main and 3 vessel coronary arteries. Thoracic aorta is normal in caliber. No most of the course of the descending thoracic aorta is located in the midline just anterior to the spine. There is mild calcified plaque throughout the thoracic aorta. There is suboptimal opacification of the pulmonary arterial system with additional mild image degradation due to arms along the sides and anterior chest. Although no definitive embolus is visualized. There are areas that are somewhat equivocal. Remaining vascular structures are unremarkable. Mediastinum/Nodes: No hilar or mediastinal adenopathy. Remaining mediastinal structures are unremarkable. Lungs/Pleura: Lungs are adequately inflated and demonstrate moderate size bilateral pleural effusions with associated compressive atelectasis in the lung bases. Airways are normal. Upper Abdomen: Calcified plaque over the abdominal aorta. No acute findings. Musculoskeletal: Moderate degenerative changes of the spine Review of the MIP images confirms the above findings. IMPRESSION: 1. Suboptimal opacification of the pulmonary arterial system with additional mild image degradation due to arms along the sides and anterior chest. Although no definitive emboli are visualized, findings are somewhat equivocal for pulmonary emboli. Consider repeat exam in 24 hours. 2. Moderate size bilateral pleural effusions with associated compressive atelectasis in the lung bases. 3. Moderate cardiomegaly with right atrial enlargement. 4. Aortic atherosclerosis.  Atherosclerotic coronary artery disease. Aortic Atherosclerosis (ICD10-I70.0). Electronically Signed   By: DMarin OlpM.D.   On: 03/06/2022 14:19  ? ?DG Chest Portable 1 View ? ?Result Date: 03/06/2022 ?CL

## 2022-03-06 NOTE — Progress Notes (Addendum)
VASCULAR LAB ? ? ? ?Right lower extremity venous duplex has been performed. ? ?See CV proc for preliminary results. ? ?Messaged results to Dr. Ronnald Nian via secure chat ? ?Cayci Mcnabb, RVT ?03/06/2022, 1:20 PM ? ?

## 2022-03-06 NOTE — H&P (Signed)
?History and Physical  ? ? ?Patient: Ronald [name]:[id number] DOB: 12-21-1933 ?DOA: 03/06/2022 ?DOS: the patient was seen and examined on 03/06/2022 ?PCP: Aletha Halim., PA-C  ?Patient coming from: Home - lives with 24/7 caretaker; Donald Prose: Pandora Leiter and daughter, [phone], [phone] ? ? ?Chief Complaint: Arm/leg edema ? ?HPI: Ronald [name] is a 88 y.o. male with medical history significant of afib on Xarelto; dementia; BPH; HTN; HLD; chronic diastolic CHF; and OSA presenting with arm/leg edema.  It started when someone stung his L foot about 1 1/2 weeks ago.  He had swelling and it has still swollen.  The right leg has developed bruising.  Both legs are swollen.  He fell maybe a month or two ago and L hand was swollen since.  Hospice is caring for him at home.  He is basically swollen everywhere.  +SOB - although he doesn't do much walking.  He has not been coughing. He maneuvers around the house through the day, although he spends a lot of time in his hospital bed.  Yesterday, he toold his daughter that he has been hurting for 3 days and it is dark in his private parts and on his butt.  Not sleeping.  He was given antibiotics last week for his left leg but has finished with those; abx were Keflex and Doxycycline x 7 days. ? ? ? ?ER Course:  Edema RLE, LUE for a few days.  B pleural effusions, anasarca.  Given Lasix and Dilt drip for afib.  ?lymphatics weird and third spacing abnormally.   ? ? ? ? ?Review of Systems: As mentioned in the history of present illness. All other systems reviewed and are negative. ?Past Medical History:  ?Diagnosis Date  ? Allergic rhinitis, cause unspecified   ? Atrial fibrillation ([place])   ? BPH (benign prostatic hypertrophy)   ? CHF (congestive heart failure) ([place])   ? Essential hypertension, benign   ? GERD (gastroesophageal reflux disease)   ? Glaucoma   ? Hepatic cyst   ? Hiatal hernia 2014  ? History of gout   ? Hyperlipidemia   ? Irritable bowel syndrome   ? Lactose  intolerance   ? Personal history of colonic polyps 03/04/1992  ? adenomatous polyp  ? Pneumonia   ? "~ 5 times in my lifetime; last time was 03/2014" (10/14/2017)  ? Reflux esophagitis   ? Schatzki's ring 2014  ? Sleep apnea   ? Vitamin B12 deficiency   ? ?Past Surgical History:  ?Procedure Laterality Date  ? CATARACT EXTRACTION W/ INTRAOCULAR LENS IMPLANT Left   ? HYDROCELE EXCISION Bilateral 10/2016  ? Archie Endo 10/11/2016  ? INTRAVASCULAR PRESSURE WIRE/FFR STUDY N/A 10/17/2017  ? Procedure: INTRAVASCULAR PRESSURE WIRE/FFR STUDY;  Surgeon: Nigel Mormon, MD;  Location: [facility];  Service: Cardiovascular;  Laterality: N/A;  ? NASAL RECONSTRUCTION    ? "tried to fix my nose so I can smell; still can't smell" (10/14/2017)  ? RIGHT/LEFT HEART CATH AND CORONARY ANGIOGRAPHY N/A 10/17/2017  ? Procedure: RIGHT/LEFT HEART CATH AND CORONARY ANGIOGRAPHY;  Surgeon: Nigel Mormon, MD;  Location: [facility];  Service: Cardiovascular;  Laterality: N/A;  ? TRANSURETHRAL RESECTION OF PROSTATE  10/2007  ? Archie Endo 08/22/2007  ? ULTRASOUND GUIDANCE FOR VASCULAR ACCESS  10/17/2017  ? Procedure: Ultrasound Guidance For Vascular Access;  Surgeon: Nigel Mormon, MD;  Location: [facility];  Service: Cardiovascular;;  ? ?Social History:  reports that he quit smoking about 38 years  ago. His smoking use included cigarettes. He has a 30.00 pack-year smoking history. He has never used smokeless tobacco. He reports that he does not drink alcohol and does not use drugs. ? ?No Active Allergies ? ?Family History  ?Problem Relation Age of Onset  ? Heart disease Sister   ? Colon cancer Neg Hx   ? Esophageal cancer Neg Hx   ? Diabetes Neg Hx   ? ? ?Prior to Admission medications   ?Medication Sig Start Date End Date Taking? Authorizing Provider  ?benzonatate (TESSALON) 100 MG capsule Take 100 mg by mouth 3 (three) times daily as needed. 02/15/22  Yes [provider]  ?cyanocobalamin 1000 MCG tablet  Take 1,000 mcg by mouth daily.   Yes [provider]  ?empagliflozin (JARDIANCE) 10 MG TABS tablet Take 1 tablet (10 mg total) by mouth daily. 05/20/21  Yes Tolia, Sunit, DO  ?QUEtiapine (SEROQUEL) 25 MG tablet Take 25 mg by mouth at bedtime. 02/20/22  Yes [provider]  ?REMERON 15 MG tablet Take 7.5 mg by mouth daily. 01/21/22  Yes [provider]  ?Rivaroxaban (XARELTO) 15 MG TABS tablet Take 1 tablet (15 mg total) by mouth daily with supper. 04/29/21 03/06/22 Yes Tolia, Sunit, DO  ?spironolactone (ALDACTONE) 25 MG tablet TAKE 1 TABLET BY MOUTH EVERY DAY IN THE MORNING ?Patient taking differently: Take 50 mg by mouth daily. 05/19/21  Yes Tolia, Sunit, DO  ?traMADol (ULTRAM) 50 MG tablet Take 50 mg by mouth 3 (three) times daily as needed. 02/24/22  Yes [provider]  ?metoprolol (TOPROL XL) 200 MG 24 hr tablet Take 1 tablet (200 mg total) by mouth daily. Hold if systolic blood pressure (top blood pressure number) less than 100 mmHg or heart rate less than 60 bpm (pulse). ?Patient not taking: Reported on 03/06/2022 07/27/21 10/25/21  Rex Kras, DO  ?rivastigmine (EXELON) 1.5 MG capsule TAKE 1 CAPSULE (1.5 MG TOTAL) BY MOUTH 2 (TWO) TIMES DAILY. START RIVASTIGMINE (EXELON) 1.5 MG, FIRST TAKE 1 TAB AT NIGHT FOR 10 DAYS, THEN INCREASE TO 1.5 MG 1 TAB AT NIGHT AND THE MORNING FOR1 MONTH, THEN WILL INCREASE TO 3 MG 1 TABLET 2 X A DAY ?Patient not taking: Reported on 03/06/2022 11/24/21   Rondel Jumbo, PA-C  ? ? ?Physical Exam: ?Vitals:  ? 03/06/22 1700 03/06/22 1715 03/06/22 1737 03/06/22 1740  ?BP:    111/79  ?Pulse: 93 61 (!) 33   ?Resp: 20 (!) 0 (!) 0 13  ?Temp:      ?TempSrc:      ?SpO2: 98% 97% (!) 63%   ? ?General:  Appears calm and comfortable and is in NAD ?Eyes:  PERRL, EOMI, normal lids, iris ?ENT:  grossly normal hearing, lips & tongue, mmm ?Neck:  no LAD, masses or thyromegaly ?Cardiovascular:  Irregularly irregular, no m/r/g. 3+ R > L LE edema.  ?Respiratory:   CTA  bilaterally with no wheezes/rales/rhonchi.  Normal respiratory effort. ?Abdomen:  soft, NT, ND, mild abdominal wall edema ?GU:   mild scrotal edema with ecchymoses noted ?Skin:  no rash or induration seen on limited exam; ecchymosis along RLE; fluctuant area along L lateral LE where reported bug bite occurred ? ? ? ? ? ?Musculoskeletal:  grossly normal tone BUE/BLE, no bony abnormality; L > R forearm edema ?Lower extremity:  R > L LE edema.  Limited foot exam with no ulcerations. ?Psychiatric:  grossly normal mood and affect, speech fluent and appropriate ?Neurologic:  CN 2-12 grossly intact, moves all  extremities in coordinated fashion ? ? ?Radiological Exams on Admission: ?Independently reviewed - see discussion in A/P where applicable ? ?CT ABDOMEN PELVIS WO CONTRAST ? ?Result Date: 03/06/2022 ?CLINICAL DATA:  Groin lymphadenopathy. EXAM: CT ABDOMEN AND PELVIS WITHOUT CONTRAST TECHNIQUE: Multidetector CT imaging of the abdomen and pelvis was performed following the standard protocol without IV contrast. RADIATION DOSE REDUCTION: This exam was performed according to the departmental dose-optimization program which includes automated exposure control, adjustment of the mA and/or kV according to patient size and/or use of iterative reconstruction technique. COMPARISON:  None Available. FINDINGS: Lower chest: Moderate bilateral pleural effusions and bibasilar atelectasis. Moderate cardiomegaly. Hepatobiliary: No mass visualized on this unenhanced exam. Several small fluid attenuation hepatic cysts noted. Mild high attenuation gallbladder sludge versus tiny gallstones. No evidence of acute cholecystitis or biliary ductal dilatation. Pancreas: No mass or inflammatory process visualized on this unenhanced exam. Spleen:  Within normal limits in size. Adrenals/Urinary tract: Small fluid attenuation renal cysts seen bilaterally (no followup imaging is recommended). Contrast within renal collecting systems and bladder from  recent chest CTA. No evidence of hydronephrosis. Stomach/Bowel: No evidence of obstruction, inflammatory process, or abscess. Diffuse mesenteric edema and mild abdominal and pelvic ascites is noted. Vascular/Lymphatic: No

## 2022-03-06 NOTE — ED Triage Notes (Signed)
Pt to triage via [facility] from home.  Reports generalized abd swelling with L arm and R leg swelling x 1 week.  States he was bit on L foot by something in yard 2 weeks ago.  Took antibiotic.  Denies pain.  Denies fever and chills. ?

## 2022-03-07 ENCOUNTER — Inpatient Hospital Stay (HOSPITAL_COMMUNITY): Payer: Medicare Other

## 2022-03-07 DIAGNOSIS — R601 Generalized edema: Secondary | ICD-10-CM

## 2022-03-07 LAB — BASIC METABOLIC PANEL
Anion gap: 9 (ref 5–15)
BUN: 23 mg/dL (ref 8–23)
CO2: 25 mmol/L (ref 22–32)
Calcium: 8.5 mg/dL — ABNORMAL LOW (ref 8.9–10.3)
Chloride: 107 mmol/L (ref 98–111)
Creatinine, Ser: 1.66 mg/dL — ABNORMAL HIGH (ref 0.61–1.24)
GFR, Estimated: 40 mL/min — ABNORMAL LOW (ref >60)
Glucose, Bld: 108 mg/dL — ABNORMAL HIGH (ref 70–99)
Potassium: 3.5 mmol/L (ref 3.5–5.1)
Sodium: 141 mmol/L (ref 135–145)

## 2022-03-07 LAB — ECHOCARDIOGRAM COMPLETE
AR max vel: 2.29 cm2
AV Peak grad: 5 mmHg
Ao pk vel: 1.12 m/s
Area-P 1/2: 5.97 cm2
Calc EF: 33 %
MV M vel: 4.72 m/s
MV Peak grad: 88.9 mmHg
P 1/2 time: 413 msec
S' Lateral: 4.3 cm
Single Plane A2C EF: 33.6 %
Single Plane A4C EF: 31.5 %
Weight: 3506.2 oz

## 2022-03-07 LAB — CBC
HCT: 31.2 % — ABNORMAL LOW (ref 39.0–52.0)
Hemoglobin: 9.3 g/dL — ABNORMAL LOW (ref 13.0–17.0)
MCH: 30.9 pg (ref 26.0–34.0)
MCHC: 29.8 g/dL — ABNORMAL LOW (ref 30.0–36.0)
MCV: 103.7 fL — ABNORMAL HIGH (ref 80.0–100.0)
Platelets: 165 10*3/uL (ref 150–400)
RBC: 3.01 MIL/uL — ABNORMAL LOW (ref 4.22–5.81)
RDW: 14.9 % (ref 11.5–15.5)
WBC: 3.4 10*3/uL — ABNORMAL LOW (ref 4.0–10.5)
nRBC: 0 % (ref 0.0–0.2)

## 2022-03-07 LAB — RETICULOCYTES
Immature Retic Fract: 18 % — ABNORMAL HIGH (ref 2.3–15.9)
RBC.: 3.2 MIL/uL — ABNORMAL LOW (ref 4.22–5.81)
Retic Count, Absolute: 200.3 10*3/uL — ABNORMAL HIGH (ref 19.0–186.0)
Retic Ct Pct: 6.3 % — ABNORMAL HIGH (ref 0.4–3.1)

## 2022-03-07 LAB — FOLATE: Folate: 5.5 ng/mL — ABNORMAL LOW (ref >5.9)

## 2022-03-07 MED ORDER — PERFLUTREN LIPID MICROSPHERE
1.0000 mL | INTRAVENOUS | Status: AC | PRN
  Administered 2022-03-07: 2 mL via INTRAVENOUS
  Filled 2022-03-07: qty 10

## 2022-03-07 NOTE — Evaluation (Signed)
Occupational Therapy Evaluation ?Patient Details ?Name: Ronald [name] ?MRN: [id number] ?DOB: Jan 15, 1934 ?Today's Date: 03/07/2022 ? ? ?History of Present Illness 88 y.o. male presenting 5/6 with arm/leg edema after insect sting 1.5 weeks ago. Found to be in Afib with RVR,  have B pleural effusions and anascara especially in RLE and LUE. PMH: afib on Xarelto; dementia; BPH; HTN; HLD; chronic diastolic CHF; and OSA  ? ?Clinical Impression ?  ?Patient demonstrates decreased independence in ADL transfers and requires min guard using RW, max A for donning socks while seated EOB and mod A for donning hospital gown around back.  Patient also noted to have decreased UB strength and ROM in L hand and wrist 2/2 2+ pitting edema.  Patient would benefit from additional OT intervention to address functional deficits and would also benefit from [facility] to address functional deficits in home environment. ?   ? ?Recommendations for follow up therapy are one component of a multi-disciplinary discharge planning process, led by the attending physician.  Recommendations may be updated based on patient status, additional functional criteria and insurance authorization.  ? ?Follow Up Recommendations ? Home health OT  ?  ?Assistance Recommended at Discharge Frequent or constant Supervision/Assistance  ?Patient can return home with the following A little help with walking and/or transfers;A little help with bathing/dressing/bathroom;Direct supervision/assist for medications management;Direct supervision/assist for financial management;Assist for transportation ? ?  ?Functional Status Assessment ? Patient has had a recent decline in their functional status and demonstrates the ability to make significant improvements in function in a reasonable and predictable amount of time.  ?Equipment Recommendations ? None recommended by OT  ?  ?Recommendations for Other Services   ? ? ?  ?Precautions / Restrictions Precautions ?Precautions:  Fall ?Restrictions ?Weight Bearing Restrictions: No  ? ?  ? ?Mobility Bed Mobility ?Overal bed mobility: Modified Independent ?  ?  ?  ?  ?  ?  ?  ?  ? ?Transfers ?Overall transfer level: Needs assistance ?Equipment used: Rolling walker (2 wheels) ?Transfers: Sit to/from Stand ?Sit to Stand: Min assist ?  ?  ?  ?  ?  ?  ?  ? ?  ?Balance Overall balance assessment: Needs assistance ?Sitting-balance support: Feet supported, No upper extremity supported ?  ?  ?  ?  ?  ?  ?  ?  ?  ?  ?  ?  ?  ?  ?  ?  ?  ?   ? ?ADL either performed or assessed with clinical judgement  ? ?ADL Overall ADL's : Needs assistance/impaired ?Eating/Feeding: Set up ?  ?Grooming: Dance movement psychotherapist;Set up;Sitting ?  ?Upper Body Bathing: Minimal assistance;Sitting ?  ?Lower Body Bathing: Moderate assistance (sitting) ?  ?Upper Body Dressing : Moderate assistance;Sitting ?  ?Lower Body Dressing: Maximal assistance (sitting) ?  ?Toilet Transfer: Minimal assistance;Cueing for safety;BSC/3in1;Rolling walker (2 wheels) ?  ?Toileting- Clothing Manipulation and Hygiene: Moderate assistance ?  ?  ?  ?Functional mobility during ADLs: Minimal assistance;Rolling walker (2 wheels) ?   ? ? ? ?Vision Baseline Vision/History: 3 Glaucoma ?Ability to See in Adequate Light: 0 Adequate ?Patient Visual Report: No change from baseline ?   ?   ?Perception   ?  ?Praxis   ?  ? ?Pertinent Vitals/Pain Pain Assessment ?Pain Assessment: No/denies pain  ? ? ? ?Hand Dominance Right ?  ?Extremity/Trunk Assessment Upper Extremity Assessment ?Upper Extremity Assessment: Overall WFL for tasks assessed (pitting Edema noted in L hand fand forearm) ?  ?Lower Extremity  Assessment ?Lower Extremity Assessment: Defer to PT evaluation ?RLE Deficits / Details: increased pitting edema, limiting ankle and knee full ROM, strength grossly assessed in ambulation 3+/5 ?LLE Deficits / Details: edema however not as severe as with R LE, strength grossly assessed during ambulation 3+/5 ?  ?Cervical /  Trunk Assessment ?Cervical / Trunk Assessment: Kyphotic ?  ?Communication Communication ?Communication: No difficulties ?  ?Cognition Arousal/Alertness: Awake/alert ?Behavior During Therapy: [facility] for tasks assessed/performed ?Overall Cognitive Status: History of cognitive impairments - at baseline ?  ?  ?  ?  ?  ?  ?  ?  ?  ?  ?  ?  ?  ?  ?  ?  ?General Comments: son in room reports pt is at baseline cognition ?  ?  ?General Comments  globalized edema, pitting in RLE and L UE, one bout of HR elevation to 129bpm while sitting at rest EoB, otherwise HR 80s at rest 100s with ambultion, BP 108/78, SpO2 on RA >90%O2 throughout session ? ?  ?Exercises   ?  ?Shoulder Instructions    ? ? ?Home Living Family/patient expects to be discharged to:: Private residence ?Living Arrangements: Other (Comment) (PATIENT HAS 24/7 CAREGIVER) ?Available Help at Discharge: Available 24 hours/day;Personal care attendant ?Type of Home: House ?Home Access: Level entry ?  ?  ?Home Layout: One level ?  ?  ?Bathroom Shower/Tub: Sponge bathes at baseline ?  ?Bathroom Toilet: Standard ?Bathroom Accessibility: Yes ?How Accessible: Other (comment) (ambulates with cane) ?Home Equipment: Conservation officer, nature (2 wheels);Cane - single point ?  ?  ?  ? ?  ?Prior Functioning/Environment Prior Level of Function : Needs assist ?  ?  ?  ?  ?  ?  ?  ?  ?  ? ?  ?  ?OT Problem List: Decreased strength;Decreased safety awareness;Decreased activity tolerance ?  ?   ?OT Treatment/Interventions: Self-care/ADL training;Therapeutic exercise;DME and/or AE instruction;Therapeutic activities;Patient/family education  ?  ?OT Goals(Current goals can be found in the care plan section) Acute Rehab OT Goals ?OT Goal Formulation: With patient ?Time For Goal Achievement: 03/16/22 ?Potential to Achieve Goals: Good  ?OT Frequency: Min 2X/week ?  ? ?Co-evaluation   ?  ?  ?  ?  ? ?  ?AM-PAC OT "6 Clicks" Daily Activity     ?Outcome Measure Help from another person eating meals?: A  Little ?Help from another person taking care of personal grooming?: A Little ?Help from another person toileting, which includes using toliet, bedpan, or urinal?: A Little ?Help from another person bathing (including washing, rinsing, drying)?: A Little ?Help from another person to put on and taking off regular upper body clothing?: A Little ?Help from another person to put on and taking off regular lower body clothing?: A Lot ?6 Click Score: 17 ?  ?End of Session Equipment Utilized During Treatment: Gait belt;Rolling walker (2 wheels) ? ?Activity Tolerance: Patient tolerated treatment well ?Patient left: in chair;with call bell/phone within reach;with chair alarm set;with family/visitor present ? ?OT Visit Diagnosis: Muscle weakness (generalized) (M62.81)  ?              ?Time: 5631-4970 ?OT Time Calculation (min): 34 min ?Charges:  OT General Charges ?$OT Visit: 1 Visit ?OT Evaluation ?$OT Eval Moderate Complexity: 1 Mod ?OT Treatments ?$Self Care/Home Management : 8-22 mins ? ?Mervyn Skeeters, OTR/L ? ?Hadley Pen ?03/07/2022, 12:18 PM ?

## 2022-03-07 NOTE — Progress Notes (Addendum)
?PROGRESS NOTE ?  ?Ronald [name]  KKX:[id number]    DOB: May 21, 1934    DOA: 03/06/2022 ? ?PCP: Aletha Halim., PA-C  ? ?I have briefly reviewed patients previous medical records in [facility]. ? ?Chief Complaint  ?Patient presents with  ? Illness  ? ? ?Brief Narrative:  ?88 year old male, lives alone but has a 24/7 caregiver to assist him, mostly sedentary and lays in bed, does ambulate with the help of a cane, medical history significant for dilated nonischemic cardiomyopathy, chronic systolic CHF, permanent atrial fibrillation on Xarelto, nonobstructive CAD, HTN, former smoker, dementia, BPH, HLD and OSA who presents with all extremity edema, dyspnea.  Admitted for anasarca, extensive area of old appearing bruise over right lower extremity, A-fib.  Unclear if on home hospice. ? ? ?Assessment & Plan:  ?Principal Problem: ?  Anasarca ?Active Problems: ?  Permanent atrial fibrillation ([place]) ?  Chronic systolic heart failure ([place]) ?  Hypertension ?  Pure hypercholesterolemia ?  Lewy body dementia with behavioral disturbance ([place]) ? ? ?Anasarca/bilateral pleural effusions ?All extremities significantly edematous with least 1 being the right upper extremity. ?Suspect multifactorial: Decompensated CHF, hypoalbuminemia,?  Nephrotic range proteinuria ?Check 24-hour urine protein ?Continue currently started IV Lasix 20 mg every 6 hours.  Discussed with RN regarding importance of strict intake output and daily weights. ?Attempt to place bilateral leg TED hoses to mobilize fluid. ?Consider IV albumin. ?Dietitian consultation. ? ?Acute on chronic systolic CHF: ?Prior 2D echo from 2015: LVEF 25-30% with diffuse hypokinesis. ?TTE 5/7: LVEF 20-25%, global hypokinesis with some regional wall abnormalities.  Suggestion of right ventricular pressure and volume overload.  Severe TR.  Tiny, flickering echodensity at tip of noncoronary cusp, possible Lambl's excrescence.  Low index of suspicion for endocarditis but will check  with cardiology. ?Continue IV Lasix 20 mg every 6 hours, metoprolol tartrate 25 Mg twice daily and Aldactone 50 Mg daily ?Jardiance on hold ?Will consult patient's primary cardiologist for assistance. ? ?Permanent A-fib: ?Controlled ventricular rate.  Continue metoprolol 25 Mg twice daily ?Extensive area of old appearing ecchymosis over the almost entire aspect of right lateral aspect into the groin.  Holding Xarelto. ?No hemorrhage seen on CT. ?Continue to hold Xarelto.  Family aware of embolic risk. ?Briefly on Cardizem drip in ED, has been discontinued appropriately so given his systolic CHF. ?CTA chest, unable to comment regarding PE, suggesting repeat study in 24 hours.  Low index of suspicion.  Moreover given CKD, would avoid repeat CTA.  No DVT by venous Doppler on left upper extremity or left lower extremity. ? ?Essential hypertension:  ?Continue low-dose metoprolol. ? ?Hyperlipidemia: ?Does not appear to be taking medications for this. ? ?Stage IIIa chronic kidney disease: ?Creatinine 1.35 in September 2022.  Presented with creatinine of 1.75 which is improved to 1.66. ?Trend BMP closely while on IV Lasix. ?Patient may well have progressed to stage IIIb CKD. ?Not a long-term HD candidate. ? ?Hypokalemia: ?Replace and follow. ? ?Macrocytic anemia: ?Hemoglobin in mid 22 was 16.1. ?Now presents with hemoglobin of 9.6, stable today compared to on admission. ?Unclear how progressive his anemia has been since the last approximately a year. ?Follow CBC in AM. ? ?Leukopenia: ?Appears chronic. ? ?Adult failure to thrive: ?Multifactorial due to very advanced age, multiple severe significant comorbidities, dementia. ?Will need to verify if patient is truly on home hospice.  Patient's son at bedside indicated that he may be on palliative care. ?DNR. ? ?Body mass index is 30.56 kg/m?. ? ? ? ? ?  DVT prophylaxis: Place TED hose Start: 03/06/22 1814 ?SCDs Start: 03/06/22 1653   ?  Code Status: DNR:  ?Family Communication:  Discussed with son at bedside. ?Disposition:  ?Status is: Inpatient ?Remains inpatient appropriate because: IV Lasix ?  ? ? ?Consultants:   ?Cardiology ? ?Procedures:   ? ? ?Antimicrobials:   ? ? ? ?Subjective:  ?Seen this morning.  Son at bedside.  Patient is a poor historian.  Patient feels that he has not made significant improvement.  No dyspnea or chest pain reported. ? ?Objective:  ? ?Vitals:  ? 03/07/22 0025 03/07/22 0356 03/07/22 0900 03/07/22 1044  ?BP: (!) 134/99 106/73 101/76 116/72  ?Pulse: 67 94 (!) 101 91  ?Resp: '16 18  19  '$ ?Temp: 97.6 ?F (36.4 ?C) 97.6 ?F (36.4 ?C)  97.9 ?F (36.6 ?C)  ?TempSrc: Oral Oral  Oral  ?SpO2: 93% 100%  90%  ?Weight:  99.4 kg    ? ? ?General exam: Elderly male, moderately built and overweight sitting up comfortably in reclining chair without distress. ?Respiratory system: Clear to auscultation. Respiratory effort normal. ?Cardiovascular system: S1 & S2 heard, RRR. No JVD, murmurs, rubs, gallops or clicks.  Anasarca+++. ?Gastrointestinal system: Abdomen is nondistended, soft and nontender. No organomegaly or masses felt. Normal bowel sounds heard. ?Central nervous system: Alert and oriented to person and partly to place. No focal neurological deficits. ?Extremities: Symmetric 5 x 5 power.  3+ pitting edema of left upper extremity and bilateral lower extremity extending to groin and abdominal wall.  2+ pitting edema of right upper extremity. ?Skin: Extensive bruising over lateral aspect of right lower extremity extending from mid leg up to the thigh and even to the buttock as per son.  The bruising looks subacute. ?Psychiatry: Judgement and insight appear impaired. Mood & affect appropriate.  ? ? ? ?Data Reviewed:   ?I have personally reviewed following labs and imaging studies ? ? ?CBC: ?Recent Labs  ?Lab 03/06/22 ?1042 03/07/22 ?0113  ?WBC 3.3* 3.4*  ?NEUTROABS 1.7  --   ?HGB 9.6* 9.3*  ?HCT 32.3* 31.2*  ?MCV 103.9* 103.7*  ?PLT 153 165  ? ? ?Basic Metabolic Panel: ?Recent  Labs  ?Lab 03/06/22 ?1042 03/07/22 ?0113  ?NA 141 141  ?K 3.3* 3.5  ?CL 106 107  ?CO2 27 25  ?GLUCOSE 96 108*  ?BUN 23 23  ?CREATININE 1.75* 1.66*  ?CALCIUM 8.8* 8.5*  ? ? ?Liver Function Tests: ?Recent Labs  ?Lab 03/06/22 ?1042  ?AST 18  ?ALT 7  ?ALKPHOS 67  ?BILITOT 2.3*  ?PROT 5.9*  ?ALBUMIN 3.0*  ? ? ?CBG: ?No results for input(s): GLUCAP in the last 168 hours. ? ?Microbiology Studies:  ?No results found for this or any previous visit (from the past 240 hour(s)). ? ?Radiology Studies:  ?CT ABDOMEN PELVIS WO CONTRAST ? ?Result Date: 03/06/2022 ?CLINICAL DATA:  Groin lymphadenopathy. EXAM: CT ABDOMEN AND PELVIS WITHOUT CONTRAST TECHNIQUE: Multidetector CT imaging of the abdomen and pelvis was performed following the standard protocol without IV contrast. RADIATION DOSE REDUCTION: This exam was performed according to the departmental dose-optimization program which includes automated exposure control, adjustment of the mA and/or kV according to patient size and/or use of iterative reconstruction technique. COMPARISON:  None Available. FINDINGS: Lower chest: Moderate bilateral pleural effusions and bibasilar atelectasis. Moderate cardiomegaly. Hepatobiliary: No mass visualized on this unenhanced exam. Several small fluid attenuation hepatic cysts noted. Mild high attenuation gallbladder sludge versus tiny gallstones. No evidence of acute cholecystitis or biliary ductal dilatation. Pancreas: No mass  or inflammatory process visualized on this unenhanced exam. Spleen:  Within normal limits in size. Adrenals/Urinary tract: Small fluid attenuation renal cysts seen bilaterally (no followup imaging is recommended). Contrast within renal collecting systems and bladder from recent chest CTA. No evidence of hydronephrosis. Stomach/Bowel: No evidence of obstruction, inflammatory process, or abscess. Diffuse mesenteric edema and mild abdominal and pelvic ascites is noted. Vascular/Lymphatic: No pathologically enlarged lymph nodes  identified. No evidence of abdominal aortic aneurysm. Aortic atherosclerotic calcification noted. Reproductive:  No mass or other significant abnormality. Other: Severe diffuse body wall edema. A small l

## 2022-03-07 NOTE — Evaluation (Signed)
Physical Therapy Evaluation ?Patient Details ?Name: Ronald [name] ?MRN: [id number] ?DOB: 08-18-1934 ?Today's Date: 03/07/2022 ? ?History of Present Illness ? 88 y.o. male presenting 5/6 with arm/leg edema after insect sting 1.5 weeks ago. Found to be in Afib with RVR,  have B pleural effusions and anascara especially in RLE and LUE. PMH: afib on Xarelto; dementia; BPH; HTN; HLD; chronic diastolic CHF; and OSA  ?Clinical Impression ? PTA pt living in his home with 24 hour/day caregiver. Pt reports that he spends most of the day in his bed. Caregiver provides limited mobilization throughout day. Pt requires assist for ADLs and iADLs, and ambulates in his home with a cane. Pt is currently limited in safe mobility by increased edema limiting ROM, in presence of generalized weakness associated decrease balance and decreased endurance. Pt requires min A for safety with transfers and ambulation in room with RW. Session abbreviated by arrival of MD. PT recommends HHPT at discharge to return to ambulation with cane. PT will continue to follow acutely.    ?   ? ?Recommendations for follow up therapy are one component of a multi-disciplinary discharge planning process, led by the attending physician.  Recommendations may be updated based on patient status, additional functional criteria and insurance authorization. ? ?Follow Up Recommendations Home health PT ? ?  ?Assistance Recommended at Discharge Frequent or constant Supervision/Assistance  ?Patient can return home with the following ? A little help with walking and/or transfers;A lot of help with bathing/dressing/bathroom;Assistance with cooking/housework;Direct supervision/assist for medications management;Direct supervision/assist for financial management;Assist for transportation;Help with stairs or ramp for entrance ? ?  ?Equipment Recommendations None recommended by PT (has necessary equipment)  ?   ?Functional Status Assessment Patient has had a recent decline in their  functional status and demonstrates the ability to make significant improvements in function in a reasonable and predictable amount of time.  ? ?  ?Precautions / Restrictions Precautions ?Precautions: Fall ?Restrictions ?Weight Bearing Restrictions: No  ? ?  ? ?Mobility ? Bed Mobility ?Overal bed mobility: Modified Independent ?  ?  ?  ?  ?  ?  ?General bed mobility comments: sitting EoB with OT on entry ?  ? ?Transfers ?Overall transfer level: Needs assistance ?Equipment used: Rolling walker (2 wheels) ?Transfers: Sit to/from Stand ?Sit to Stand: Min assist ?  ?  ?  ?  ?  ?General transfer comment: good power up, light min A for safety with balance in standing ?  ? ?Ambulation/Gait ?Ambulation/Gait assistance: Min assist ?Gait Distance (Feet): 18 Feet ?Assistive device: Rolling walker (2 wheels) ?Gait Pattern/deviations: Step-through pattern, Trunk flexed, Shuffle ?Gait velocity: slowed ?Gait velocity interpretation: <1.31 ft/sec, indicative of household ambulator ?  ?General Gait Details: light min A for safety with ambulation to door and back, vc for proximity to RW, but given pt flexed forward posture RW pushed forward ? ?  ? ?  ? ?Balance Overall balance assessment: Needs assistance ?Sitting-balance support: Feet supported, No upper extremity supported ?Sitting balance-Leahy Scale: Good ?  ?  ?Standing balance support: No upper extremity supported, During functional activity, Single extremity supported, Bilateral upper extremity supported ?Standing balance-Leahy Scale: Fair ?Standing balance comment: fair static balance, benefits from at least single UE support for dynamic activity ?  ?  ?  ?  ?  ?  ?  ?  ?  ?  ?  ?   ? ? ? ?Pertinent Vitals/Pain Pain Assessment ?Pain Assessment: No/denies pain  ? ? ?Home Living Family/patient expects to be  discharged to:: Private residence ?Living Arrangements: Other (Comment) (PATIENT HAS 24/7 CAREGIVER) ?Available Help at Discharge: Available 24 hours/day;Personal care  attendant ?Type of Home: House ?Home Access: Level entry ?  ?  ?  ?Home Layout: One level ?Home Equipment: Conservation officer, nature (2 wheels);Cane - single point ?   ?  ?Prior Function Prior Level of Function : Needs assist ?  ?  ?  ?  ?  ?  ?  ?  ?  ? ? ?Hand Dominance  ? Dominant Hand: Right ? ?  ?Extremity/Trunk Assessment  ? Upper Extremity Assessment ?Upper Extremity Assessment: Defer to OT evaluation ?  ? ?Lower Extremity Assessment ?Lower Extremity Assessment: RLE deficits/detail;LLE deficits/detail ?RLE Deficits / Details: increased pitting edema, limiting ankle and knee full ROM, strength grossly assessed in ambulation 3+/5 ?LLE Deficits / Details: edema however not as severe as with R LE, strength grossly assessed during ambulation 3+/5 ?  ? ?Cervical / Trunk Assessment ?Cervical / Trunk Assessment: Kyphotic  ?Communication  ? Communication: No difficulties  ?Cognition Arousal/Alertness: Awake/alert ?Behavior During Therapy: [facility] for tasks assessed/performed ?Overall Cognitive Status: History of cognitive impairments - at baseline ?  ?  ?  ?  ?  ?  ?  ?  ?  ?  ?  ?  ?  ?  ?  ?  ?General Comments: son in room reports pt is at baseline cognition ?  ?  ? ?  ?General Comments General comments (skin integrity, edema, etc.): globalized edema, pitting in RLE and L UE, one bout of HR elevation to 129bpm while sitting at rest EoB, otherwise HR 80s at rest 100s with ambultion, BP 108/78, SpO2 on RA >90%O2 throughout session ? ?  ?   ? ?Assessment/Plan  ?  ?PT Assessment Patient needs continued PT services  ?PT Problem List Decreased activity tolerance;Decreased balance;Decreased cognition;Decreased strength;Decreased range of motion;Decreased safety awareness ? ?   ?  ?PT Treatment Interventions DME instruction;Gait training;Functional mobility training;Therapeutic activities;Therapeutic exercise;Balance training;Cognitive remediation;Patient/family education   ? ?PT Goals (Current goals can be found in the Care Plan section)   ?Acute Rehab PT Goals ?Patient Stated Goal: go home ?PT Goal Formulation: With patient/family ?Time For Goal Achievement: 03/21/22 ?Potential to Achieve Goals: Good ? ?  ?Frequency Min 3X/week ?  ? ? ?   ?AM-PAC PT "6 Clicks" Mobility  ?Outcome Measure Help needed turning from your back to your side while in a flat bed without using bedrails?: None ?Help needed moving from lying on your back to sitting on the side of a flat bed without using bedrails?: None ?Help needed moving to and from a bed to a chair (including a wheelchair)?: A Little ?Help needed standing up from a chair using your arms (e.g., wheelchair or bedside chair)?: A Little ?Help needed to walk in hospital room?: A Little ?Help needed climbing 3-5 steps with a railing? : A Lot ?6 Click Score: 19 ? ?  ?End of Session Equipment Utilized During Treatment: Gait belt ?Activity Tolerance: Patient tolerated treatment well ?Patient left: in chair;with call bell/phone within reach;with chair alarm set;with family/visitor present;Other (comment) (MD in room) ?Nurse Communication: Mobility status ?PT Visit Diagnosis: Unsteadiness on feet (R26.81);Muscle weakness (generalized) (M62.81);Difficulty in walking, not elsewhere classified (R26.2);Adult, failure to thrive (R62.7) ?  ? ?Time: 9622-2979 ?PT Time Calculation (min) (ACUTE ONLY): 15 min ? ? ?Charges:   PT Evaluation ?$PT Eval Moderate Complexity: 1 Mod ?  ?  ?   ? ? ?Verlon Carcione B. [employer] PT,  DPT ?Acute Rehabilitation Services ?Please use secure chat or  ?Call Office [phone] ? ? ?[place] ?03/07/2022, 1:34 PM ? ?

## 2022-03-08 DIAGNOSIS — E440 Moderate protein-calorie malnutrition: Secondary | ICD-10-CM | POA: Insufficient documentation

## 2022-03-08 DIAGNOSIS — R601 Generalized edema: Secondary | ICD-10-CM

## 2022-03-08 DIAGNOSIS — I5023 Acute on chronic systolic (congestive) heart failure: Secondary | ICD-10-CM

## 2022-03-08 DIAGNOSIS — I4891 Unspecified atrial fibrillation: Secondary | ICD-10-CM

## 2022-03-08 LAB — COMPREHENSIVE METABOLIC PANEL
ALT: 8 U/L (ref 0–44)
AST: 17 U/L (ref 15–41)
Albumin: 2.7 g/dL — ABNORMAL LOW (ref 3.5–5.0)
Alkaline Phosphatase: 58 U/L (ref 38–126)
Anion gap: 7 (ref 5–15)
BUN: 26 mg/dL — ABNORMAL HIGH (ref 8–23)
CO2: 27 mmol/L (ref 22–32)
Calcium: 8.4 mg/dL — ABNORMAL LOW (ref 8.9–10.3)
Chloride: 106 mmol/L (ref 98–111)
Creatinine, Ser: 1.7 mg/dL — ABNORMAL HIGH (ref 0.61–1.24)
GFR, Estimated: 39 mL/min — ABNORMAL LOW (ref >60)
Glucose, Bld: 93 mg/dL (ref 70–99)
Potassium: 4.2 mmol/L (ref 3.5–5.1)
Sodium: 140 mmol/L (ref 135–145)
Total Bilirubin: 2.4 mg/dL — ABNORMAL HIGH (ref 0.3–1.2)
Total Protein: 5.2 g/dL — ABNORMAL LOW (ref 6.5–8.1)

## 2022-03-08 LAB — CBC
HCT: 31.6 % — ABNORMAL LOW (ref 39.0–52.0)
Hemoglobin: 9.8 g/dL — ABNORMAL LOW (ref 13.0–17.0)
MCH: 31.5 pg (ref 26.0–34.0)
MCHC: 31 g/dL (ref 30.0–36.0)
MCV: 101.6 fL — ABNORMAL HIGH (ref 80.0–100.0)
Platelets: 159 10*3/uL (ref 150–400)
RBC: 3.11 MIL/uL — ABNORMAL LOW (ref 4.22–5.81)
RDW: 15 % (ref 11.5–15.5)
WBC: 4 10*3/uL (ref 4.0–10.5)
nRBC: 0 % (ref 0.0–0.2)

## 2022-03-08 LAB — IRON AND TIBC
Iron: 57 ug/dL (ref 45–182)
Saturation Ratios: 21 % (ref 17.9–39.5)
TIBC: 277 ug/dL (ref 250–450)
UIBC: 220 ug/dL

## 2022-03-08 LAB — FERRITIN: Ferritin: 106 ng/mL (ref 24–336)

## 2022-03-08 LAB — VITAMIN B12: Vitamin B-12: 1972 pg/mL — ABNORMAL HIGH (ref 180–914)

## 2022-03-08 MED ORDER — ADULT MULTIVITAMIN W/MINERALS CH
1.0000 | ORAL_TABLET | Freq: Every day | ORAL | Status: DC
  Administered 2022-03-08 – 2022-03-10 (×3): 1 via ORAL
  Filled 2022-03-08 (×3): qty 1

## 2022-03-08 MED ORDER — RIVAROXABAN 15 MG PO TABS
15.0000 mg | ORAL_TABLET | Freq: Every day | ORAL | Status: DC
Start: 2022-03-08 — End: 2022-03-09
  Administered 2022-03-08: 15 mg via ORAL
  Filled 2022-03-08: qty 1

## 2022-03-08 MED ORDER — ISOSORB DINITRATE-HYDRALAZINE 20-37.5 MG PO TABS
1.0000 | ORAL_TABLET | Freq: Three times a day (TID) | ORAL | Status: DC
  Administered 2022-03-08 – 2022-03-10 (×7): 1 via ORAL
  Filled 2022-03-08 (×7): qty 1

## 2022-03-08 MED ORDER — FOLIC ACID 1 MG PO TABS
1.0000 mg | ORAL_TABLET | Freq: Every day | ORAL | Status: DC
  Administered 2022-03-08 – 2022-03-10 (×3): 1 mg via ORAL
  Filled 2022-03-08 (×3): qty 1

## 2022-03-08 MED ORDER — ALBUMIN HUMAN 25 % IV SOLN
12.5000 g | Freq: Once | INTRAVENOUS | Status: AC
  Administered 2022-03-08: 12.5 g via INTRAVENOUS
  Filled 2022-03-08: qty 50

## 2022-03-08 NOTE — Progress Notes (Signed)
Initial Nutrition Assessment ? ?DOCUMENTATION CODES:  ?Non-severe (moderate) malnutrition in context of chronic illness ? ?INTERVENTION:  ?Liberalize diet to regular with no added salt for advanced age and malnutrition ?Magic cup TID with meals, each supplement provides 290 kcal and 9 grams of protein ?Folic acid '1mg'$  daily x 30 days for deficiency ?MVI with minerals daily ? ?NUTRITION DIAGNOSIS:  ?Moderate Malnutrition related to chronic illness (CHF) as evidenced by mild fat depletion, mild muscle depletion. ? ?GOAL:  ?Patient will meet greater than or equal to 90% of their needs ? ?MONITOR:  ?PO intake, I & O's, Supplement acceptance, Weight trends ? ?REASON FOR ASSESSMENT:  ? ?Consult ?Assessment of nutrition requirement/status ? ?ASSESSMENT:  ?Pt with hx of atrial fibrillation, dementia, HTN, HLD, CHF, GERD, IBS, and vitamin B12 deficiency presented to ED with edema to the extremities and SOB. ? ?Pt resting in bed at the time of assessment napping. Slightly disoriented upon waking, asking if it was day or night. ? ?Pt somewhat of a poor historian. Reports he had eggs and toast for breakfast this AM and ate well. Reports eating 2 meals a day at baseline, but unable to determine what types of foods were commonly eaten. Pt said "I take it one day at a time" as a response to several questions. Denies trouble chewing or swallowing.  ? ?Reviewed chart and noted that folate levels low when assessed yesterday. Discussed with MD, will add replacement.  ? ?Pt remains with significant edema to his arms and legs on exam. Pt is unsure what his normal weight is. Mild muscle and fat depletions noted to face and upper body region. Suspect malnutrition may be severe, but unable to clearly dx due to unclear nutrition hx and edema.   ? ?Nutritionally Relevant Medications: ?Scheduled Meds: ? docusate sodium  100 mg Oral BID  ? furosemide  20 mg Intravenous Q6H  ? mirtazapine  7.5 mg Oral Daily  ? potassium chloride  40 mEq Oral BID   ? spironolactone  50 mg Oral Daily  ? ?PRN Meds: bisacodyl, ondansetron, polyethylene glycol ? ?Labs Reviewed: ?BUN 26, creatinine 1.7 ? ?Vitamin Labs ?Folate 5.5 (low) ?Vitamin B12 1,972 (high) ? ?NUTRITION - FOCUSED PHYSICAL EXAM: ?Flowsheet Row Most Recent Value  ?Orbital Region Mild depletion  ?Upper Arm Region No depletion  ?Thoracic and Lumbar Region No depletion  ?Buccal Region Mild depletion  ?Temple Region Mild depletion  ?Clavicle Bone Region Mild depletion  ?Clavicle and Acromion Bone Region Mild depletion  ?Scapular Bone Region No depletion  ?Dorsal Hand No depletion  ?Patellar Region No depletion  ?Anterior Thigh Region No depletion  ?Posterior Calf Region No depletion  ?Edema (RD Assessment) Severe  ?Hair Reviewed  ?Eyes Reviewed  ?Mouth Reviewed  ?Skin Reviewed  ?Nails Reviewed  ? ?Diet Order:   ?Diet Order   ? ?       ?  Diet Heart Room service appropriate? Yes; Fluid consistency: Thin; Fluid restriction: 1500 mL Fluid  Diet effective now       ?  ? ?  ?  ? ?  ? ? ?EDUCATION NEEDS:  ? ?Not appropriate for education at this time ? ?Skin:  Skin Assessment: Reviewed RN Assessment ? ?Last BM:  unsure ? ?Height:  ?Ht Readings from Last 1 Encounters:  ?03/08/22 '6\' 3"'$  (1.905 m)  ? ? ?Weight:  ?Wt Readings from Last 1 Encounters:  ?03/08/22 95.8 kg  ? ? ?Ideal Body Weight:  89.1 kg ? ?BMI:  Body mass index is  26.4 kg/m?. ? ?Estimated Nutritional Needs:  ?Kcal:  2000-2200 kcal/d ?Protein:  100-120 g/d ?Fluid:  2-2.2L/d ? ? ?Ranell Patrick, RD, LDN ?Clinical Dietitian ?RD pager # available in [place]  ?After hours/weekend pager # available in [place] ?

## 2022-03-08 NOTE — Consult Note (Signed)
CARDIOLOGY CONSULT NOTE  ?Patient ID: ?Ronald [name] ?MRN: [id number] ?DOB/AGE: 03-11-1934 88 y.o. ? ?Admit date: 03/06/2022 ?Referring Physician  Modena Jansky, MD ?Primary Physician:  Aletha Halim., PA-C ?Reason for Consultation:  anasarca ? ?Patient ID: Ronald [name], male    DOB: 1934-09-14, 88 y.o.   MRN: [id number] ? ?Chief Complaint  ?Patient presents with  ? Illness  ? ?HPI:   ? ?Ronald [name]  is a 88 y.o. AA male with history of dilated nonischemic cardiomyopathy, heart failure with reduced EF, permanent atrial fibrillation, nonobstructive CAD, hypertension, former smoker, hyperlipidemia, OSA. Patient was last seen in our office 04/2021 and advised to follow-up in 6 months, unfortunately he was lost to follow-up after that visit. ? ?Patient now presents to [facility] with complaints of worsening both upper and lower extremity edema over the last 2 to 3 weeks.  At last office visit patient weighed 169 pounds, upon presentation to the hospital patient weighed 219 pounds.  Evaluation upon presentation revealed bilateral pleural effusions, anasarca, hypokalemia, creatinine elevated above baseline (1.35) to 1.75.  Repeat echocardiogram essentially unchanged compared to previous.  Patient was started on IV Lasix as well as metoprolol and spironolactone.  Upon presentation patient was initially in A-fib with RVR, rate control improved with addition of metoprolol ? ?Notably at baseline patient has dementia and has 24/7 care assistance when at home although he lives independently.  History is obtained from patient as well as record review.  ? ?At time of exam this morning patient is sitting up in chair resting comfortably.  He reports minimal improvement of swelling since admission.  Denies orthopnea, PND, chest pain.  Does have shortness of breath over the last few weeks. ? ?Past Medical History:  ?Diagnosis Date  ? Allergic rhinitis, cause unspecified   ? Atrial fibrillation ([place])   ? BPH (benign  prostatic hypertrophy)   ? CHF (congestive heart failure) ([place])   ? Essential hypertension, benign   ? GERD (gastroesophageal reflux disease)   ? Glaucoma   ? Hepatic cyst   ? Hiatal hernia 2014  ? History of gout   ? Hyperlipidemia   ? Irritable bowel syndrome   ? Lactose intolerance   ? Personal history of colonic polyps 03/04/1992  ? adenomatous polyp  ? Pneumonia   ? "~ 5 times in my lifetime; last time was 03/2014" (10/14/2017)  ? Reflux esophagitis   ? Schatzki's ring 2014  ? Sleep apnea   ? Vitamin B12 deficiency   ? ?Past Surgical History:  ?Procedure Laterality Date  ? CATARACT EXTRACTION W/ INTRAOCULAR LENS IMPLANT Left   ? HYDROCELE EXCISION Bilateral 10/2016  ? Archie Endo 10/11/2016  ? INTRAVASCULAR PRESSURE WIRE/FFR STUDY N/A 10/17/2017  ? Procedure: INTRAVASCULAR PRESSURE WIRE/FFR STUDY;  Surgeon: Nigel Mormon, MD;  Location: [place] [facility];  Service: Cardiovascular;  Laterality: N/A;  ? NASAL RECONSTRUCTION    ? "tried to fix my nose so I can smell; still can't smell" (10/14/2017)  ? RIGHT/LEFT HEART CATH AND CORONARY ANGIOGRAPHY N/A 10/17/2017  ? Procedure: RIGHT/LEFT HEART CATH AND CORONARY ANGIOGRAPHY;  Surgeon: Nigel Mormon, MD;  Location: [place] [facility];  Service: Cardiovascular;  Laterality: N/A;  ? TRANSURETHRAL RESECTION OF PROSTATE  10/2007  ? Archie Endo 08/22/2007  ? ULTRASOUND GUIDANCE FOR VASCULAR ACCESS  10/17/2017  ? Procedure: Ultrasound Guidance For Vascular Access;  Surgeon: Nigel Mormon, MD;  Location: [place] [facility];  Service: Cardiovascular;;  ? ?Family History  ?Problem  Relation Age of Onset  ? Heart disease Sister   ? Colon cancer Neg Hx   ? Esophageal cancer Neg Hx   ? Diabetes Neg Hx   ? ?Social History  ? ?Tobacco Use  ? Smoking status: Former  ?  Packs/day: 1.00  ?  Years: 30.00  ?  Pack years: 30.00  ?  Types: Cigarettes  ?  Quit date: 11/02/1983  ?  Years since quitting: 38.3  ? Smokeless tobacco: Never  ?Substance Use Topics  ? Alcohol use: No  ?   ?Marital Sttus: Married  ?ROS  ?Review of Systems  ?Constitutional: Positive for weight gain. Negative for malaise/fatigue.  ?Cardiovascular:  Positive for leg swelling. Negative for chest pain, claudication, near-syncope, orthopnea, palpitations, paroxysmal nocturnal dyspnea and syncope.  ?Respiratory:  Positive for shortness of breath.   ?Objective  ? ? ?  03/08/2022  ?  6:38 AM 03/08/2022  ?  6:34 AM 03/07/2022  ?  9:34 PM  ?Vitals with BMI  ?Height  '6\' 3"'$    ?Weight  211 lbs 3 oz   ?BMI  26.4   ?Systolic 161  096  ?Diastolic 80  90  ?Pulse   110  ?  ?Blood pressure 134/80, pulse (!) 110, temperature (!) 97.5 ?F (36.4 ?C), temperature source Oral, resp. rate 20, height '6\' 3"'$  (1.905 m), weight 95.8 kg, SpO2 99 %.  ?  ?Physical Exam ?Vitals reviewed.  ?Constitutional:   ?   Appearance: He is obese.  ?Neck:  ?   Vascular: JVD present.  ?Cardiovascular:  ?   Rate and Rhythm: Normal rate. Rhythm irregular.  ?   Heart sounds: S1 normal and S2 normal. Murmur heard.  ?Holosystolic murmur is present at the apex.  ?  No gallop.  ?   Comments: Peripheral pulses difficult to feel due to edema. ?Pulmonary:  ?   Effort: Pulmonary effort is normal. No respiratory distress.  ?   Breath sounds: Rales (bilateral bases) present. No wheezing or rhonchi.  ?Musculoskeletal:  ?   Right lower leg: Edema (3+ to thigh) present.  ?   Left lower leg: Edema (3+ to thigh) present.  ?   Comments: Left arm edema 3+ to the upper arm  ? ?Laboratory examination:  ? ?Recent Labs  ?  03/06/22 ?1042 03/07/22 ?0113 03/08/22 ?0144  ?NA 141 141 140  ?K 3.3* 3.5 4.2  ?CL 106 107 106  ?CO2 '27 25 27  '$ ?GLUCOSE 96 108* 93  ?BUN 23 23 26*  ?CREATININE 1.75* 1.66* 1.70*  ?CALCIUM 8.8* 8.5* 8.4*  ?GFRNONAA 38* 40* 39*  ? ?estimated creatinine clearance is 38 mL/min (A) (by C-G formula based on SCr of 1.7 mg/dL (H)).  ? ?  Latest Ref Rng & Units 03/08/2022  ?  1:44 AM 03/07/2022  ?  1:13 AM 03/06/2022  ? 10:42 AM  ?CMP  ?Glucose 70 - 99 mg/dL 93   108   96    ?BUN 8 - 23  mg/dL '26   23   23    '$ ?Creatinine 0.61 - 1.24 mg/dL 1.70   1.66   1.75    ?Sodium 135 - 145 mmol/L 140   141   141    ?Potassium 3.5 - 5.1 mmol/L 4.2   3.5   3.3    ?Chloride 98 - 111 mmol/L 106   107   106    ?CO2 22 - 32 mmol/L '27   25   27    '$ ?Calcium 8.9 -  10.3 mg/dL 8.4   8.5   8.8    ?Total Protein 6.5 - 8.1 g/dL 5.2    5.9    ?Total Bilirubin 0.3 - 1.2 mg/dL 2.4    2.3    ?Alkaline Phos 38 - 126 U/L 58    67    ?AST 15 - 41 U/L 17    18    ?ALT 0 - 44 U/L 8    7    ? ? ?  Latest Ref Rng & Units 03/08/2022  ?  1:44 AM 03/07/2022  ?  1:13 AM 03/06/2022  ? 10:42 AM  ?CBC  ?WBC 4.0 - 10.5 K/uL 4.0   3.4   3.3    ?Hemoglobin 13.0 - 17.0 g/dL 9.8   9.3   9.6    ?Hematocrit 39.0 - 52.0 % 31.6   31.2   32.3    ?Platelets 150 - 400 K/uL 159   165   153    ? ?Lipid Panel ?No results for input(s): CHOL, TRIG, LDLCALC, VLDL, HDL, CHOLHDL, LDLDIRECT in the last 8760 hours.  ?HEMOGLOBIN A1C ?Lab Results  ?Component Value Date  ? HGBA1C 5.8 (H) 03/26/2014  ? MPG 120 (H) 03/26/2014  ? ?TSH ?No results for input(s): TSH in the last 8760 hours. ?BNP (last 3 results) ?Recent Labs  ?  03/06/22 ?1154  ?BNP 605.5*  ? ?Results for orders placed or performed during the hospital encounter of 03/06/22 (from the past 48 hour(s))  ?Comprehensive metabolic panel     Status: Abnormal  ? Collection Time: 03/06/22 10:42 AM  ?Result Value Ref Range  ? Sodium 141 135 - 145 mmol/L  ? Potassium 3.3 (L) 3.5 - 5.1 mmol/L  ? Chloride 106 98 - 111 mmol/L  ? CO2 27 22 - 32 mmol/L  ? Glucose, Bld 96 70 - 99 mg/dL  ?  Comment: Glucose reference range applies only to samples taken after fasting for at least 8 hours.  ? BUN 23 8 - 23 mg/dL  ? Creatinine, Ser 1.75 (H) 0.61 - 1.24 mg/dL  ? Calcium 8.8 (L) 8.9 - 10.3 mg/dL  ? Total Protein 5.9 (L) 6.5 - 8.1 g/dL  ? Albumin 3.0 (L) 3.5 - 5.0 g/dL  ? AST 18 15 - 41 U/L  ? ALT 7 0 - 44 U/L  ? Alkaline Phosphatase 67 38 - 126 U/L  ? Total Bilirubin 2.3 (H) 0.3 - 1.2 mg/dL  ? GFR, Estimated 38 (L) >60 mL/min  ?   Comment: (NOTE) ?Calculated using the CKD-EPI Creatinine Equation (2021) ?  ? Anion gap 8 5 - 15  ?  Comment: Performed at [facility], [place] [address], [place], [place] [address]  ?CBC with Differential     S

## 2022-03-08 NOTE — Progress Notes (Signed)
Occupational Therapy Treatment ?Patient Details ?Name: Ronald [name] ?MRN: [id number] ?DOB: 08-Jan-1934 ?Today's Date: 03/08/2022 ? ? ?History of present illness 88 y.o. male presenting 5/6 with arm/leg edema after insect sting 1.5 weeks ago. Found to be in Afib with RVR,  have B pleural effusions and anascara especially in RLE and LUE. PMH: afib on Xarelto; dementia; BPH; HTN; HLD; chronic diastolic CHF; and OSA ?  ?OT comments ? Patient able to get to EOB without assistance and was max assist to donn compression stockings and socks. Patient ambulated to sink and required seated rest break. Patient stood at sink to perform grooming tasks and stated he was too fatigued to do standing and perform tasks seated. Patient transferred to recliner and was setup for lunch with assistance to open containers. Acute OT to continue to follow.   ? ?Recommendations for follow up therapy are one component of a multi-disciplinary discharge planning process, led by the attending physician.  Recommendations may be updated based on patient status, additional functional criteria and insurance authorization. ?   ?Follow Up Recommendations ? Home health OT  ?  ?Assistance Recommended at Discharge Frequent or constant Supervision/Assistance  ?Patient can return home with the following ? A little help with walking and/or transfers;A little help with bathing/dressing/bathroom;Direct supervision/assist for medications management;Direct supervision/assist for financial management;Assist for transportation ?  ?Equipment Recommendations ? None recommended by OT  ?  ?Recommendations for Other Services   ? ?  ?Precautions / Restrictions Precautions ?Precautions: Fall ?Restrictions ?Weight Bearing Restrictions: No  ? ? ?  ? ?Mobility Bed Mobility ?Overal bed mobility: Modified Independent ?  ?  ?  ?  ?  ?  ?General bed mobility comments: able to get to EOB without assistance ?  ? ?Transfers ?Overall transfer level: Needs assistance ?Equipment used:  Rolling walker (2 wheels) ?Transfers: Sit to/from Stand ?Sit to Stand: Min assist ?  ?  ?  ?  ?  ?General transfer comment: min assist to power up to stand from elevated surfaces ?  ?  ?Balance Overall balance assessment: Needs assistance ?Sitting-balance support: Feet supported, No upper extremity supported ?Sitting balance-Leahy Scale: Good ?  ?  ?Standing balance support: No upper extremity supported, During functional activity, Single extremity supported, Bilateral upper extremity supported ?Standing balance-Leahy Scale: Fair ?Standing balance comment: attempted to stand at sink for grooming but demonstated limited standing tolerance ?  ?  ?  ?  ?  ?  ?  ?  ?  ?  ?  ?   ? ?ADL either performed or assessed with clinical judgement  ? ?ADL Overall ADL's : Needs assistance/impaired ?Eating/Feeding: Set up ?Eating/Feeding Details (indicate cue type and reason): assistance to open containers ?Grooming: Wash/dry hands;Wash/dry face;Oral care;Set up;Sitting ?Grooming Details (indicate cue type and reason): attempted to perform while standing with patient stating he was unable to and asked to sit ?  ?  ?  ?  ?  ?  ?Lower Body Dressing: Maximal assistance ?Lower Body Dressing Details (indicate cue type and reason): to donn socks ?  ?  ?  ?  ?  ?  ?  ?General ADL Comments: attempted to perform grooming tasks standing with patient demonstrating poor standing tolerance ?  ? ?Extremity/Trunk Assessment   ?  ?  ?  ?  ?  ? ?Vision   ?  ?  ?Perception   ?  ?Praxis   ?  ? ?Cognition Arousal/Alertness: Awake/alert ?Behavior During Therapy: [facility] for tasks assessed/performed ?Overall Cognitive Status:  History of cognitive impairments - at baseline ?  ?  ?  ?  ?  ?  ?  ?  ?  ?  ?  ?  ?  ?  ?  ?  ?  ?  ?  ?   ?Exercises   ? ?  ?Shoulder Instructions   ? ? ?  ?General Comments    ? ? ?Pertinent Vitals/ Pain       Pain Assessment ?Pain Assessment: No/denies pain ? ?Home Living   ?  ?  ?  ?  ?  ?  ?  ?  ?  ?  ?  ?  ?  ?  ?  ?  ?  ?  ? ?   ?Prior Functioning/Environment    ?  ?  ?  ?   ? ?Frequency ? Min 2X/week  ? ? ? ? ?  ?Progress Toward Goals ? ?OT Goals(current goals can now be found in the care plan section) ? Progress towards OT goals: Progressing toward goals ? ?Acute Rehab OT Goals ?OT Goal Formulation: With patient ?Time For Goal Achievement: 03/16/22 ?Potential to Achieve Goals: Good ?ADL Goals ?Pt Will Perform Upper Body Bathing: with supervision ?Pt Will Perform Lower Body Bathing: with mod assist;with adaptive equipment ?Pt Will Perform Upper Body Dressing: with supervision;with set-up ?Pt Will Perform Lower Body Dressing: with mod assist;with adaptive equipment ?Pt Will Transfer to Toilet: with modified independence ?Pt Will Perform Toileting - Clothing Manipulation and hygiene: with modified independence  ?Plan Discharge plan remains appropriate   ? ?Co-evaluation ? ? ?   ?  ?  ?  ?  ? ?  ?AM-PAC OT "6 Clicks" Daily Activity     ?Outcome Measure ? ? Help from another person eating meals?: A Little ?Help from another person taking care of personal grooming?: A Little ?Help from another person toileting, which includes using toliet, bedpan, or urinal?: A Little ?Help from another person bathing (including washing, rinsing, drying)?: A Little ?Help from another person to put on and taking off regular upper body clothing?: A Little ?Help from another person to put on and taking off regular lower body clothing?: A Lot ?6 Click Score: 17 ? ?  ?End of Session Equipment Utilized During Treatment: Gait belt;Rolling walker (2 wheels) ? ?OT Visit Diagnosis: Muscle weakness (generalized) (M62.81) ?  ?Activity Tolerance Patient tolerated treatment well ?  ?Patient Left in chair;with call bell/phone within reach;with chair alarm set ?  ?Nurse Communication Mobility status ?  ? ?   ? ?Time: 1223-1300 ?OT Time Calculation (min): 37 min ? ?Charges: OT General Charges ?$OT Visit: 1 Visit ?OT Treatments ?$Self Care/Home Management : 23-37 mins ? ?Lodema Hong, OTA ?Acute Rehabilitation Services  ?Pager [phone] ?Office [phone] ? ? ?[place] ?03/08/2022, 2:00 PM ?

## 2022-03-08 NOTE — Progress Notes (Signed)
ANTICOAGULATION CONSULT NOTE - Initial Consult ? ?Pharmacy Consult for xarelto ?Indication: atrial fibrillation ? ?No Active Allergies ? ?Patient Measurements: ?Height: '6\' 3"'$  (190.5 cm) ?Weight: 95.8 kg (211 lb 3.2 oz) ?IBW/kg (Calculated) : 84.5 ? ? ?Vital Signs: ?Temp: 97.5 ?F (36.4 ?C) (05/08 1829) ?Temp Source: Oral (05/08 9371) ?BP: 115/87 (05/08 1032) ? ?Labs: ?Recent Labs  ?  03/06/22 ?1042 03/06/22 ?1154 03/06/22 ?1435 03/07/22 ?0113 03/08/22 ?0144  ?HGB 9.6*  --   --  9.3* 9.8*  ?HCT 32.3*  --   --  31.2* 31.6*  ?PLT 153  --   --  165 159  ?CREATININE 1.75*  --   --  1.66* 1.70*  ?TROPONINIHS  --  17 18*  --   --   ? ? ?Estimated Creatinine Clearance: 38 mL/min (A) (by C-G formula based on SCr of 1.7 mg/dL (H)). ? ? ?Medical History: ?Past Medical History:  ?Diagnosis Date  ? Allergic rhinitis, cause unspecified   ? Atrial fibrillation ([place])   ? BPH (benign prostatic hypertrophy)   ? CHF (congestive heart failure) ([place])   ? Essential hypertension, benign   ? GERD (gastroesophageal reflux disease)   ? Glaucoma   ? Hepatic cyst   ? Hiatal hernia 2014  ? History of gout   ? Hyperlipidemia   ? Irritable bowel syndrome   ? Lactose intolerance   ? Personal history of colonic polyps 03/04/1992  ? adenomatous polyp  ? Pneumonia   ? "~ 5 times in my lifetime; last time was 03/2014" (10/14/2017)  ? Reflux esophagitis   ? Schatzki's ring 2014  ? Sleep apnea   ? Vitamin B12 deficiency   ? ? ?Medications:  ?Facility-Administered Medications Prior to Admission  ?Medication Dose Route Frequency Provider Last Rate Last Admin  ? cyanocobalamin ((VITAMIN B-12)) injection 1,000 mcg  1,000 mcg Intramuscular Q30 days Sable Feil, MD   1,000 mcg at 12/04/12 6967  ? ?Medications Prior to Admission  ?Medication Sig Dispense Refill Last Dose  ? benzonatate (TESSALON) 100 MG capsule Take 100 mg by mouth 3 (three) times daily as needed.   03/05/2022  ? cyanocobalamin 1000 MCG tablet Take 1,000 mcg by mouth daily.   03/05/2022  ?  empagliflozin (JARDIANCE) 10 MG TABS tablet Take 1 tablet (10 mg total) by mouth daily. 30 tablet 0 03/05/2022  ? QUEtiapine (SEROQUEL) 25 MG tablet Take 25 mg by mouth at bedtime.   03/05/2022  ? REMERON 15 MG tablet Take 7.5 mg by mouth daily.   03/05/2022  ? Rivaroxaban (XARELTO) 15 MG TABS tablet Take 1 tablet (15 mg total) by mouth daily with supper. 90 tablet 0 03/05/2022 at 1000  ? spironolactone (ALDACTONE) 25 MG tablet TAKE 1 TABLET BY MOUTH EVERY DAY IN THE MORNING (Patient taking differently: Take 50 mg by mouth daily.) 90 tablet 0 03/05/2022  ? traMADol (ULTRAM) 50 MG tablet Take 50 mg by mouth 3 (three) times daily as needed.   03/05/2022  ? metoprolol (TOPROL XL) 200 MG 24 hr tablet Take 1 tablet (200 mg total) by mouth daily. Hold if systolic blood pressure (top blood pressure number) less than 100 mmHg or heart rate less than 60 bpm (pulse). (Patient not taking: Reported on 03/06/2022) 90 tablet 3 Not Taking  ? ? ?Assessment: ?88 yo male with afib on Xarelto PTA. Anticoagulation has been on hold for extensive brusing. Plans are to resume today (home dose '15mg'$  po daily) ?-hg= 9.8 ?-SCr= 1.8, CrCl ~ 43 (using TBW) ? ?  Goal of Therapy:  ?Monitor platelets by anticoagulation protocol: Yes ?  ?Plan:  ?-Restart xarelto '15mg'$  po with dinner ?-Will follow patient progress ? ?Hildred Laser, PharmD ?Clinical Pharmacist ?**Pharmacist phone directory can now be found on [email or web address] ([email or web address]).  Listed under [place]. ? ? ? ?

## 2022-03-08 NOTE — Progress Notes (Signed)
PROGRESS NOTE   Ronald [name]  UXL:[id number]    DOB: Mar 07, 1934    DOA: 03/06/2022  PCP: Richmond Campbell., PA-C   I have briefly reviewed patients previous medical records in [facility].  Chief Complaint  Patient presents with   Illness    Brief Narrative:  88 year old male, lives alone but has a 24/7 caregiver to assist him, mostly sedentary and lays in bed, does ambulate with the help of a cane, medical history significant for dilated nonischemic cardiomyopathy, chronic systolic CHF, permanent atrial fibrillation on Xarelto, nonobstructive CAD, HTN, former smoker, dementia, BPH, HLD and OSA who presents with all extremity edema, dyspnea.  Admitted for anasarca, extensive area of old appearing bruise over right lower extremity, A-fib.  Unclear if on home hospice.  Cardiology consulted.  Diuresing with IV Lasix.   Assessment & Plan:  Principal Problem:   Anasarca Active Problems:   Permanent atrial fibrillation (HCC)   Acute on chronic HFrEF (heart failure with reduced ejection fraction) (HCC)   Hypertension   Pure hypercholesterolemia   Lewy body dementia with behavioral disturbance (HCC)   Atrial fibrillation with RVR (HCC)   Malnutrition of moderate degree   Anasarca/bilateral pleural effusions Suspect multifactorial: Decompensated CHF, hypoalbuminemia,?  Nephrotic range proteinuria Check urine protein creatinine ratio. Continue currently started IV Lasix 20 mg every 6 hours.  Strict intake output and daily weights. -1.8 L thus far but not sure if this is accurate.  Weight documented as down from 219 yesterday to 211 today, again not sure if this is accurate either. Attempt to place bilateral leg TED hoses to mobilize fluid. Will give a dose of IV albumin. Dietitian consulted.  Acute on chronic systolic CHF: Prior 2D echo from 2015: LVEF 25-30% with diffuse hypokinesis. TTE 5/7: LVEF 20-25%, global hypokinesis with some regional wall abnormalities.  Suggestion of  right ventricular pressure and volume overload.  Severe TR.  Tiny, flickering echodensity at tip of noncoronary cusp, possible Lambl's excrescence.  Low index of suspicion for endocarditis but will check with cardiology. Continue IV Lasix 20 mg every 6 hours, metoprolol tartrate 25 Mg twice daily.  Jardiance on hold due to AKI. Cardiology consultation appreciated.  Aldactone discontinued.  BiDil initiated.  Permanent A-fib: Controlled ventricular rate.  Continue metoprolol 25 Mg twice daily Extensive area of old appearing ecchymosis over the almost entire aspect of right lateral aspect into the groin.  Holding Xarelto. No hemorrhage seen on CT. Briefly on Cardizem drip in ED, has been discontinued appropriately so given his systolic CHF. CTA chest, unable to comment regarding PE, suggesting repeat study in 24 hours.  Low index of suspicion.  Moreover given CKD, would avoid repeat CTA.  No DVT by venous Doppler on left upper extremity or left lower extremity. Xarelto had been held since admission.  Hemoglobin has remained stable.  No hemorrhage seen on CT.  As per cardiology, resumed Xarelto.  Essential hypertension:  Continue low-dose metoprolol.  Controlled.  Hyperlipidemia: Does not appear to be taking medications for this.  AKI complicating stage IIIa chronic kidney disease: Creatinine 1.35 in September 2022.  Presented with creatinine of 1.75 which is improved to 1.66 >1.7. Trend BMP closely while on IV Lasix. Patient may well have progressed to stage IIIb CKD. Not a long-term HD candidate.  Hypokalemia: Replaced.  Macrocytic anemia/folate deficiency: Hemoglobin in mid 2022 was 16.1. Now presents with hemoglobin of 9.6, stable in the 9 g range since admission. Unclear how progressive his anemia has been since  the last approximately a year. Anemia panel reviewed: Ferritin 106, folate 5.5, B12: 1972.  Initiated folate supplements.  Leukopenia: WBC normal today.  Adult failure  to thrive: Multifactorial due to very advanced age, multiple severe significant comorbidities, dementia. Will need to verify if patient is truly on home hospice.  Patient's son at bedside indicated that he may be on palliative care. DNR.  Body mass index is 26.4 kg/m.     DVT prophylaxis: Place TED hose Start: 03/06/22 1814 SCDs Start: 03/06/22 1653     Code Status: DNR:  Family Communication: None at bedside. Disposition:  Status is: Inpatient Remains inpatient appropriate because: IV Lasix     Consultants:   Cardiology  Procedures:     Antimicrobials:      Subjective:  Patient alone in the room.  Lying comfortably in bed.  Denies complaints.  He feels that his extremity swelling is improving.  No dyspnea or chest pain reported.  Objective:   Vitals:   03/07/22 2134 03/08/22 0634 03/08/22 0638 03/08/22 1032  BP: 120/90  134/80 115/87  Pulse: (!) 110     Resp:   20 19  Temp:   (!) 97.5 F (36.4 C)   TempSrc:   Oral   SpO2:   99%   Weight:  95.8 kg    Height:  6\' 3"  (1.905 m)      General exam: Elderly male, moderately built and overweight sitting up comfortably in bed without distress. Respiratory system: Clear to auscultation. Respiratory effort normal. Cardiovascular system: S1 & S2 heard, RRR. No JVD, murmurs, rubs, gallops or clicks.  Anasarca+++.  May be a little improvement in his leg edema but otherwise does not appear to be much different than yesterday. Gastrointestinal system: Abdomen is nondistended, soft and nontender. No organomegaly or masses felt. Normal bowel sounds heard. Central nervous system: Alert and oriented to person and partly to place. No focal neurological deficits. Extremities: Symmetric 5 x 5 power.  3+ pitting edema of left upper extremity and bilateral lower extremity extending to groin and abdominal wall.  2+ pitting edema of right upper extremity. Skin: Extensive bruising over lateral aspect of right lower extremity extending  from mid leg up to the thigh and even to the buttock as per son.  The bruising looks subacute. Psychiatry: Judgement and insight appear impaired. Mood & affect appropriate.     Data Reviewed:   I have personally reviewed following labs and imaging studies   CBC: Recent Labs  Lab 03/06/22 1042 03/07/22 0113 03/08/22 0144  WBC 3.3* 3.4* 4.0  NEUTROABS 1.7  --   --   HGB 9.6* 9.3* 9.8*  HCT 32.3* 31.2* 31.6*  MCV 103.9* 103.7* 101.6*  PLT 153 165 159    Basic Metabolic Panel: Recent Labs  Lab 03/06/22 1042 03/07/22 0113 03/08/22 0144  NA 141 141 140  K 3.3* 3.5 4.2  CL 106 107 106  CO2 27 25 27   GLUCOSE 96 108* 93  BUN 23 23 26*  CREATININE 1.75* 1.66* 1.70*  CALCIUM 8.8* 8.5* 8.4*    Liver Function Tests: Recent Labs  Lab 03/06/22 1042 03/08/22 0144  AST 18 17  ALT 7 8  ALKPHOS 67 58  BILITOT 2.3* 2.4*  PROT 5.9* 5.2*  ALBUMIN 3.0* 2.7*    CBG: No results for input(s): GLUCAP in the last 168 hours.  Microbiology Studies:  No results found for this or any previous visit (from the past 240 hour(s)).  Radiology Studies:  CT  ABDOMEN PELVIS WO CONTRAST  Result Date: 03/06/2022 CLINICAL DATA:  Groin lymphadenopathy. EXAM: CT ABDOMEN AND PELVIS WITHOUT CONTRAST TECHNIQUE: Multidetector CT imaging of the abdomen and pelvis was performed following the standard protocol without IV contrast. RADIATION DOSE REDUCTION: This exam was performed according to the departmental dose-optimization program which includes automated exposure control, adjustment of the mA and/or kV according to patient size and/or use of iterative reconstruction technique. COMPARISON:  None Available. FINDINGS: Lower chest: Moderate bilateral pleural effusions and bibasilar atelectasis. Moderate cardiomegaly. Hepatobiliary: No mass visualized on this unenhanced exam. Several small fluid attenuation hepatic cysts noted. Mild high attenuation gallbladder sludge versus tiny gallstones. No evidence of  acute cholecystitis or biliary ductal dilatation. Pancreas: No mass or inflammatory process visualized on this unenhanced exam. Spleen:  Within normal limits in size. Adrenals/Urinary tract: Small fluid attenuation renal cysts seen bilaterally (no followup imaging is recommended). Contrast within renal collecting systems and bladder from recent chest CTA. No evidence of hydronephrosis. Stomach/Bowel: No evidence of obstruction, inflammatory process, or abscess. Diffuse mesenteric edema and mild abdominal and pelvic ascites is noted. Vascular/Lymphatic: No pathologically enlarged lymph nodes identified. No evidence of abdominal aortic aneurysm. Aortic atherosclerotic calcification noted. Reproductive:  No mass or other significant abnormality. Other: Severe diffuse body wall edema. A small left inguinal hernia is seen which contains only fluid. No herniated bowel loops. No evidence of inguinal lymphadenopathy or soft tissue mass. Musculoskeletal:  No suspicious bone lesions identified. IMPRESSION: No evidence of inguinal lymphadenopathy or soft tissue mass. Small left inguinal hernia which contains small amount of ascites. Severe anasarca. Gallbladder sludge versus tiny gallstones. No radiographic evidence of acute cholecystitis or biliary ductal dilatation. Moderate cardiomegaly. Electronically Signed   By: Danae Orleans M.D.   On: 03/06/2022 17:31   ECHOCARDIOGRAM COMPLETE  Result Date: 03/07/2022    ECHOCARDIOGRAM REPORT   Patient Name:   MARQUEIS SHILLINGBURG Date of Exam: 03/07/2022 Medical Rec #:  [id number]       Height:       71.0 in Accession #:    [id number]      Weight:       219.1 lb Date of Birth:  02/07/1934       BSA:          2.192 m Patient Age:    87 years        BP:           106/73 mmHg Patient Gender: M               HR:           86 bpm. Exam Location:  Inpatient Procedure: 2D Echo, Cardiac Doppler, Color Doppler and Intracardiac            Opacification Agent Indications:    Anasarca  History:         Patient has no prior history of Echocardiogram examinations.                 CHF, Arrythmias:Atrial Fibrillation; Risk Factors:Hypertension.  Sonographer:    Cleatis Polka Referring Phys: [id number] D Georges Victorio IMPRESSIONS  1. Left ventricular ejection fraction, by estimation, is 20 to 25%. The left ventricle has severely decreased function. The left ventricle demonstrates global hypokinesis with some regional variation. Left ventricular diastolic parameters are indeterminate. The interventricular septum is flattened in systole and diastole, consistent with right ventricular pressure and volume overload.  2. Right ventricular systolic function is moderately reduced. The right ventricular size is moderately  enlarged. There is moderately elevated pulmonary artery systolic pressure. The estimated right ventricular systolic pressure is 54.2 mmHg.  3. Left atrial size was moderately dilated.  4. Right atrial size was severely dilated.  5. There is a trivial pericardial effusion posterior to the left ventricle.  6. The mitral valve is degenerative. Mild to moderate mitral valve regurgitation.  7. The tricuspid valve is abnormal ,incomplete coaptation with annular dilatation. Tricuspid valve regurgitation is severe.  8. Tiny, flickering echodensity at tip of noncoronary cusp. Possible Lambl's excrescence (normal variant), although if there is any suspicion for endocarditis would at least consider blood cultures. The aortic valve is tricuspid. Aortic valve regurgitation is mild. Aortic regurgitation PHT measures 413 msec.  9. The inferior vena cava is dilated in size with <50% respiratory variability, suggesting right atrial pressure of 15 mmHg. Comparison(s): Prior images unable to be directly viewed, comparison made by report only. LVEF reportedly 25-30% in 2015. FINDINGS  Left Ventricle: Left ventricular ejection fraction, by estimation, is 20 to 25%. The left ventricle has severely decreased function. The left ventricle  demonstrates global hypokinesis. The left ventricular internal cavity size was normal in size. There is borderline left ventricular hypertrophy. The interventricular septum is flattened in systole and diastole, consistent with right ventricular pressure and volume overload. Left ventricular diastolic parameters are indeterminate. Right Ventricle: The right ventricular size is moderately enlarged. No increase in right ventricular wall thickness. Right ventricular systolic function is moderately reduced. There is moderately elevated pulmonary artery systolic pressure. The tricuspid  regurgitant velocity is 3.13 m/s, and with an assumed right atrial pressure of 15 mmHg, the estimated right ventricular systolic pressure is 54.2 mmHg. Left Atrium: Left atrial size was moderately dilated. Right Atrium: Right atrial size was severely dilated. Pericardium: Trivial pericardial effusion is present. The pericardial effusion is posterior to the left ventricle. Mitral Valve: The mitral valve is degenerative in appearance. There is mild thickening of the mitral valve leaflet(s). Mild to moderate mitral valve regurgitation, with posteriorly-directed jet. Tricuspid Valve: The tricuspid valve is abnormal. Tricuspid valve regurgitation is severe. Aortic Valve: Tiny, flickering echodensity at tip of noncoronary cusp. Possible Lambl's excrescence (normal variant), although if there is any suspicion for endocarditis would at least consider blood cultures. The aortic valve is tricuspid. Aortic valve regurgitation is mild. Aortic regurgitation PHT measures 413 msec. Aortic valve peak gradient measures 5.0 mmHg. Pulmonic Valve: The pulmonic valve was grossly normal. Pulmonic valve regurgitation is trivial. Aorta: The aortic root is normal in size and structure. Venous: The inferior vena cava is dilated in size with less than 50% respiratory variability, suggesting right atrial pressure of 15 mmHg. IAS/Shunts: No atrial level shunt detected  by color flow Doppler.  LEFT VENTRICLE PLAX 2D LVIDd:         5.50 cm      Diastology LVIDs:         4.30 cm      LV e' medial:    8.73 cm/s LV PW:         1.20 cm      LV E/e' medial:  8.8 LV IVS:        0.90 cm      LV e' lateral:   15.90 cm/s LVOT diam:     2.00 cm      LV E/e' lateral: 4.8 LV SV:         41 LV SV Index:   19 LVOT Area:     3.14 cm  LV Volumes (  MOD) LV vol d, MOD A2C: 223.0 ml LV vol d, MOD A4C: 184.0 ml LV vol s, MOD A2C: 148.0 ml LV vol s, MOD A4C: 126.0 ml LV SV MOD A2C:     75.0 ml LV SV MOD A4C:     184.0 ml LV SV MOD BP:      67.4 ml RIGHT VENTRICLE            IVC RV Basal diam:  4.90 cm    IVC diam: 4.00 cm RV Mid diam:    4.90 cm RV S prime:     7.83 cm/s TAPSE (M-mode): 1.5 cm LEFT ATRIUM           Index        RIGHT ATRIUM           Index LA diam:      4.80 cm 2.19 cm/m   RA Area:     41.80 cm LA Vol (A2C): 92.3 ml 42.11 ml/m  RA Volume:   187.00 ml 85.31 ml/m LA Vol (A4C): 94.1 ml 42.93 ml/m  AORTIC VALVE AV Area (Vmax): 2.29 cm AV Vmax:        112.00 cm/s AV Peak Grad:   5.0 mmHg LVOT Vmax:      81.80 cm/s LVOT Vmean:     55.900 cm/s LVOT VTI:       0.131 m AI PHT:         413 msec  AORTA Ao Root diam: 3.60 cm Ao Asc diam:  3.90 cm MITRAL VALVE               TRICUSPID VALVE MV Area (PHT): 5.97 cm    TR Peak grad:   39.2 mmHg MV Decel Time: 127 msec    TR Vmax:        313.00 cm/s MR Peak grad: 88.9 mmHg MR Mean grad: 59.0 mmHg    SHUNTS MR Vmax:      471.50 cm/s  Systemic VTI:  0.13 m MR Vmean:     368.0 cm/s   Systemic Diam: 2.00 cm MV E velocity: 76.80 cm/s MV A velocity: 30.90 cm/s MV E/A ratio:  2.49 Nona Dell MD Electronically signed by Nona Dell MD Signature Date/Time: 03/07/2022/11:01:36 AM    Final     Scheduled Meds:    docusate sodium  100 mg Oral BID   folic acid  1 mg Oral Daily   furosemide  20 mg Intravenous Q6H   isosorbide-hydrALAZINE  1 tablet Oral TID   metoprolol tartrate  25 mg Oral BID   mirtazapine  7.5 mg Oral Daily   multivitamin with  minerals  1 tablet Oral Daily   potassium chloride  40 mEq Oral BID   QUEtiapine  25 mg Oral QHS   sodium chloride flush  3 mL Intravenous Q12H    Continuous Infusions:     LOS: 2 days     Marcellus Scott, MD,  FACP, [facility], [facility], [facility] (Care Management Physician Certified) [facility] [place]  To contact the attending provider between 7A-7P or the covering provider during after hours 7P-7A, please log into the web site [email or web address] and access using universal [place] password for that web site. If you do not have the password, please call the hospital operator.  03/08/2022, 2:39 PM

## 2022-03-09 LAB — CBC
HCT: 33.9 % — ABNORMAL LOW (ref 39.0–52.0)
Hemoglobin: 10.2 g/dL — ABNORMAL LOW (ref 13.0–17.0)
MCH: 30.9 pg (ref 26.0–34.0)
MCHC: 30.1 g/dL (ref 30.0–36.0)
MCV: 102.7 fL — ABNORMAL HIGH (ref 80.0–100.0)
Platelets: 142 10*3/uL — ABNORMAL LOW (ref 150–400)
RBC: 3.3 MIL/uL — ABNORMAL LOW (ref 4.22–5.81)
RDW: 15.2 % (ref 11.5–15.5)
WBC: 4.8 10*3/uL (ref 4.0–10.5)
nRBC: 0 % (ref 0.0–0.2)

## 2022-03-09 LAB — COMPREHENSIVE METABOLIC PANEL
ALT: 9 U/L (ref 0–44)
AST: 23 U/L (ref 15–41)
Albumin: 2.8 g/dL — ABNORMAL LOW (ref 3.5–5.0)
Alkaline Phosphatase: 57 U/L (ref 38–126)
Anion gap: 7 (ref 5–15)
BUN: 26 mg/dL — ABNORMAL HIGH (ref 8–23)
CO2: 29 mmol/L (ref 22–32)
Calcium: 8.5 mg/dL — ABNORMAL LOW (ref 8.9–10.3)
Chloride: 104 mmol/L (ref 98–111)
Creatinine, Ser: 1.76 mg/dL — ABNORMAL HIGH (ref 0.61–1.24)
GFR, Estimated: 37 mL/min — ABNORMAL LOW (ref >60)
Glucose, Bld: 91 mg/dL (ref 70–99)
Potassium: 4.7 mmol/L (ref 3.5–5.1)
Sodium: 140 mmol/L (ref 135–145)
Total Bilirubin: 2 mg/dL — ABNORMAL HIGH (ref 0.3–1.2)
Total Protein: 5.5 g/dL — ABNORMAL LOW (ref 6.5–8.1)

## 2022-03-09 MED ORDER — TORSEMIDE 20 MG PO TABS
20.0000 mg | ORAL_TABLET | Freq: Two times a day (BID) | ORAL | 0 refills | Status: AC
Start: 2022-03-09 — End: ?

## 2022-03-09 MED ORDER — METOPROLOL TARTRATE 25 MG PO TABS: 25.0000 mg | ORAL_TABLET | Freq: Two times a day (BID) | ORAL | 0 refills | Status: AC

## 2022-03-09 MED ORDER — ISOSORB DINITRATE-HYDRALAZINE 20-37.5 MG PO TABS: 1.0000 | ORAL_TABLET | Freq: Three times a day (TID) | ORAL | 0 refills | Status: AC

## 2022-03-09 MED ORDER — TORSEMIDE 20 MG PO TABS
20.0000 mg | ORAL_TABLET | Freq: Two times a day (BID) | ORAL | Status: DC
  Administered 2022-03-09 – 2022-03-10 (×3): 20 mg via ORAL
  Filled 2022-03-09 (×3): qty 1

## 2022-03-09 MED ORDER — POTASSIUM CHLORIDE CRYS ER 20 MEQ PO TBCR: 20.0000 meq | EXTENDED_RELEASE_TABLET | Freq: Every day | ORAL | 0 refills | Status: AC

## 2022-03-09 NOTE — Discharge Summary (Signed)
Physician Discharge Summary  ?KHALEE MAZO POE:[id number] DOB: [birth year] ? ?PCP: Heywood Bene, PA-C ? ?Admitted from: Home ?Discharged to: Home with hospice ? ?Admit date: 03/06/2022 ?Discharge date: 03/09/2022 ? ?Recommendations for Outpatient Follow-up:  ? ? Follow-up Information   ? ? Heywood Bene, PA-C. Schedule an appointment as soon as possible for a visit in 1 week(s).   ?Specialty: Physician Assistant ?Why: Consider repeating labs (CBC & BMP), if patient/family wish to pursue aggressive care. ?Contact information: ?[address] ?[place] [address] ?[phone] ? ? ?  ?  ? ? Tolia, Sunit, DO. Schedule an appointment as soon as possible for a visit.   ?Specialties: Cardiology, Vascular Surgery ?Why: As needed ?Contact information: ?[address] ?[place] [address] ?[phone] ? ? ?  ?  ? ?  ?  ? ?  ? ? ? ?Home Health: None ?  ? ?Equipment/Devices: Per home hospice. ?  ? ?Discharge Condition: Guarded and overall poor prognosis. ?  Code Status: DNR ?Diet recommendation:  ?Discharge Diet Orders (From admission, onward)  ? ?  Start     Ordered  ? 03/09/22 0000  Diet - low sodium heart healthy       ? 03/09/22 1334  ? ?  ?  ? ?  ?  ? ?Discharge Diagnoses:  ?Principal Problem: ?  Anasarca ?Active Problems: ?  Permanent atrial fibrillation ([place]) ?  Acute on chronic HFrEF (heart failure with reduced ejection fraction) ([place]) ?  Hypertension ?  Pure hypercholesterolemia ?  Lewy body dementia with behavioral disturbance ([place]) ?  Atrial fibrillation with RVR ([place]) ?  Malnutrition of moderate degree ? ? ?Brief Summary: ?88 year old male, lives alone but has a 24/7 caregiver to assist him, mostly sedentary and lays in bed, does ambulate with the help of a cane, medical history significant for dilated nonischemic cardiomyopathy, chronic systolic CHF, permanent atrial fibrillation on Xarelto, nonobstructive CAD, HTN, former smoker, dementia, BPH, HLD and OSA who presents with all  extremity edema, dyspnea.  Admitted for anasarca, extensive area of old appearing bruise over right lower extremity, A-fib.  Unclear if on home hospice.  Cardiology consulted.  Diuresed with IV Lasix.  After discussing extensively with cardiology, his prognosis was determined to be extremely guarded and hospice was felt to be most appropriate for the situation.  It is not clear if patient was on hospice PTA.  I discussed in detail with patient's healthcare power of attorney daughter and Daughter, updated patient's care and options of continued aggressive care with likely ongoing pattern of brief, partial recovery and then getting worse again versus comfort oriented care and home hospice.  Advised them that he would not be appropriate for residential hospice in the current state.  After careful consideration, they opted to go home with hospice and if and when patient declines then would consider residential hospice at that point.  They also were agreeable to stopping Xarelto due to risks of bleeding outweigh the benefits of stroke prevention.  They however wish to continue the other medications.  Consulted TOC for home hospice. ?  ?  ?Assessment & Plan:  ?  ?Anasarca/bilateral pleural effusions ?Suspect multifactorial: Decompensated CHF, hypoalbuminemia,?  Nephrotic range proteinuria ?Treated with IV Lasix 20 mg every 6 hours.  Strict intake output and daily weights. ?-3.9 L thus far.  Weight recording is inaccurate. ?Has bilateral lower extremity TED hoses. ?S/p dose of IV albumin yesterday. ?Dietitian consulted. ?Cardiology has switch diuretics to oral Demadex,  continue same at discharge. ?Since patient comfortable at rest, not dyspneic or hypoxic, deferred thoracentesis because this will likely be temporary relief and fluid will reaccumulate due to CHF. ?  ?Acute on chronic systolic CHF: ?Prior 2D echo from 2015: LVEF 25-30% with diffuse hypokinesis. ?TTE 5/7: LVEF 20-25%, global hypokinesis with some regional  wall abnormalities.  Suggestion of right ventricular pressure and volume overload.  Severe TR.  Tiny, flickering echodensity at tip of noncoronary cusp, possible Lambl's excrescence.  Low index of suspicion for endocarditis but will check with cardiology. ?Treated with IV Lasix 20 mg every 6 hours, metoprolol tartrate 25 Mg twice daily.  ?Jardiance on hold due to AKI. ?Cardiology consultation appreciated.  Aldactone discontinued.  BiDil initiated. ?As per cardiology input, transitioned to torsemide, expect renal function to deteriorate as he has RV systolic dysfunction as well as severe TR related to RV dilatation.  They recommend continuing BiDil, Demadex, metoprolol tartrate and recommend adding low-dose potassium supplements. ?  ?Permanent A-fib: ?Controlled ventricular rate.  Continue metoprolol 25 Mg twice daily ?Extensive area of old appearing ecchymosis over the almost entire aspect of right lateral aspect into the groin.  Holding Xarelto. ?No hemorrhage seen on CT. ?Briefly on Cardizem drip in ED, has been discontinued appropriately so given his systolic CHF. ?CTA chest, unable to comment regarding PE, suggesting repeat study in 24 hours.  Low index of suspicion.  Moreover given CKD, would avoid repeat CTA.  No DVT by venous Doppler on left upper extremity or left lower extremity. ?Xarelto had been held since admission.  Hemoglobin has remained stable.  No hemorrhage seen on CT.  As per cardiology, resumed Xarelto. ?However after extensive discussion again today, final decision made to discontinue Xarelto due to risks of bleeding outweighing benefits of stroke prevention. ?  ?Essential hypertension:  ?Continue low-dose metoprolol.  Controlled. ? ?Hyperlipidemia: ?Does not appear to be taking medications for this. ? ?AKI complicating stage IIIa chronic kidney disease: ?Creatinine 1.35 in September 2022.  Presented with creatinine of 1.75 which is improved to 1.66 >1.7 >1.76. ?Patient may well have progressed  to stage IIIb CKD. ?Not a long-term HD candidate. ?  ?Hypokalemia: ?Replaced. ?  ?Macrocytic anemia/folate deficiency: ?Hemoglobin in mid 2022 was 16.1. ?Now presents with hemoglobin of 9.6, stable in the 9 g range since admission. ?Unclear how progressive his anemia has been since the last approximately a year. ?Anemia panel reviewed: Ferritin 106, folate 5.5, B12: 1972.   ?  ?Leukopenia: ?WBC normal. ? ?Hyperbilirubinemia ?May be related to bleeding of right lower extremity with associated hemolysis.  No GI symptoms. ?  ?Adult failure to thrive: ?Multifactorial due to very advanced age, multiple severe significant comorbidities, dementia. ?Patient is DNR.  As discussed above, plan is to discharge home with home hospice. ?  ?Body mass index is 26.4 kg/m?. ?  ?  ?Consultants:   ?Cardiology ?  ?Procedures:   ?  ? ?Discharge Instructions ? ?Discharge Instructions   ? ? (HEART FAILURE PATIENTS) Call MD:  Anytime you have any of the following symptoms: 1) 3 pound weight gain in 24 hours or 5 pounds in 1 week 2) shortness of breath, with or without a dry hacking cough 3) swelling in the hands, feet or stomach 4) if you have to sleep on extra pillows at night in order to breathe.   Complete by: As directed ?  ? Call MD for:  difficulty breathing, headache or visual disturbances   Complete by: As directed ?  ? Call  MD for:  extreme fatigue   Complete by: As directed ?  ? Call MD for:  persistant dizziness or light-headedness   Complete by: As directed ?  ? Call MD for:  persistant nausea and vomiting   Complete by: As directed ?  ? Call MD for:  severe uncontrolled pain   Complete by: As directed ?  ? Call MD for:  temperature >100.4   Complete by: As directed ?  ? Diet - low sodium heart healthy   Complete by: As directed ?  ? Increase activity slowly   Complete by: As directed ?  ? ?  ? ?  ?Medication List  ?  ? ?STOP taking these medications   ? ?empagliflozin 10 MG Tabs tablet ?Commonly known as: Jardiance ?   ?metoprolol 200 MG 24 hr tablet ?Commonly known as: Toprol XL ?  ?Rivaroxaban 15 MG Tabs tablet ?Commonly known as: XARELTO ?  ?spironolactone 25 MG tablet ?Commonly known as: ALDACTONE ?  ? ?  ? ?TAKE these medi

## 2022-03-09 NOTE — Progress Notes (Signed)
Physical Therapy Treatment ?Patient Details ?Name: Ronald [name] ?MRN: [id number] ?DOB: 12/25/1933 ?Today's Date: 03/09/2022 ? ? ?History of Present Illness 88 y.o. male adm 5/6 with arm/leg edema after insect sting 1.5 weeks ago. Pt with Afib with RVR, bil pleural effusions and anascara. PMH: afib on Xarelto; dementia; BPH; HTN; HLD; chronic diastolic CHF; and OSA ? ?  ?PT Comments  ? ? Pt pleasant and able to progress to limited gait in hallway this session with education for HEP and progression. PT with tachycardia end of session HR 128 with bil LE HEP and deferred additional exercises. Will continue to follow.  ? ?HR 86-112 with gait ?   ?Recommendations for follow up therapy are one component of a multi-disciplinary discharge planning process, led by the attending physician.  Recommendations may be updated based on patient status, additional functional criteria and insurance authorization. ? ?Follow Up Recommendations ? Home health PT ?  ?  ?Assistance Recommended at Discharge Frequent or constant Supervision/Assistance  ?Patient can return home with the following A little help with walking and/or transfers;A lot of help with bathing/dressing/bathroom;Assistance with cooking/housework;Direct supervision/assist for medications management;Direct supervision/assist for financial management;Assist for transportation;Help with stairs or ramp for entrance ?  ?Equipment Recommendations ? None recommended by PT  ?  ?Recommendations for Other Services   ? ? ?  ?Precautions / Restrictions Precautions ?Precautions: Fall  ?  ? ?Mobility ? Bed Mobility ?Overal bed mobility: Modified Independent ?  ?  ?  ?  ?  ?  ?General bed mobility comments: increased time, HOB 30 degrees ?  ? ?Transfers ?Overall transfer level: Needs assistance ?  ?Transfers: Sit to/from Stand ?Sit to Stand: Min guard ?  ?  ?  ?  ?  ?General transfer comment: cues for hand placement. Pt performed with reliance on bil UE from bed and recliner. Pt performed  5 sit to stands with bil UE use in 72 sec ?  ? ?Ambulation/Gait ?Ambulation/Gait assistance: Min assist ?Gait Distance (Feet): 80 Feet ?Assistive device: Rolling walker (2 wheels) ?Gait Pattern/deviations: Step-through pattern, Trunk flexed, Decreased stride length ?  ?Gait velocity interpretation: <1.8 ft/sec, indicate of risk for recurrent falls ?  ?General Gait Details: cues for posture, proximity to RW, direction and safety ? ? ?Stairs ?  ?  ?  ?  ?  ? ? ?Wheelchair Mobility ?  ? ?Modified Rankin (Stroke Patients Only) ?  ? ? ?  ?Balance Overall balance assessment: Needs assistance ?Sitting-balance support: Feet supported, No upper extremity supported ?Sitting balance-Leahy Scale: Good ?  ?  ?Standing balance support: Bilateral upper extremity supported, Reliant on assistive device for balance ?Standing balance-Leahy Scale: Poor ?Standing balance comment: bil UE on rW throughout standing ?  ?  ?  ?  ?  ?  ?  ?  ?  ?  ?  ?  ? ?  ?Cognition Arousal/Alertness: Awake/alert ?Behavior During Therapy: [facility] for tasks assessed/performed ?Overall Cognitive Status: History of cognitive impairments - at baseline ?  ?  ?  ?  ?  ?  ?  ?  ?  ?  ?  ?  ?  ?  ?  ?  ?General Comments: pt with decreased orientation and STM, pleasant and following 2 step commands ?  ?  ? ?  ?Exercises General Exercises - Lower Extremity ?Long Arc Quad: AROM, Both, Seated, 15 reps ? ?  ?General Comments   ?  ?  ? ?Pertinent Vitals/Pain Pain Assessment ?Pain Assessment: No/denies pain  ? ? ?  Home Living   ?  ?  ?  ?  ?  ?  ?  ?  ?  ?   ?  ?Prior Function    ?  ?  ?   ? ?PT Goals (current goals can now be found in the care plan section) Progress towards PT goals: Progressing toward goals ? ?  ?Frequency ? ? ? Min 3X/week ? ? ? ?  ?PT Plan Current plan remains appropriate  ? ? ?Co-evaluation   ?  ?  ?  ?  ? ?  ?AM-PAC PT "6 Clicks" Mobility   ?Outcome Measure ? Help needed turning from your back to your side while in a flat bed without using bedrails?:  None ?Help needed moving from lying on your back to sitting on the side of a flat bed without using bedrails?: None ?Help needed moving to and from a bed to a chair (including a wheelchair)?: A Little ?Help needed standing up from a chair using your arms (e.g., wheelchair or bedside chair)?: A Little ?Help needed to walk in hospital room?: A Little ?Help needed climbing 3-5 steps with a railing? : A Lot ?6 Click Score: 19 ? ?  ?End of Session Equipment Utilized During Treatment: Gait belt ?Activity Tolerance: Patient tolerated treatment well ?Patient left: in chair;with call bell/phone within reach;with chair alarm set;with nursing/sitter in room ?Nurse Communication: Mobility status ?PT Visit Diagnosis: Muscle weakness (generalized) (M62.81);Difficulty in walking, not elsewhere classified (R26.2);Other abnormalities of gait and mobility (R26.89) ?  ? ? ?Time: [phone] ?PT Time Calculation (min) (ACUTE ONLY): 23 min ? ?Charges:  $Gait Training: 8-22 mins ?$Therapeutic Exercise: 8-22 mins          ?          ? ?Ronon Ferger P, PT ?Acute Rehabilitation Services ?Pager: [phone] ?Office: [phone] ? ? ? ?Pilar Westergaard B Janaa Acero ?03/09/2022, 9:40 AM ? ?

## 2022-03-09 NOTE — Progress Notes (Addendum)
[employer]ufacturing [employer] [facility]) Hospital Liaison Note ? ?Referral received for patient/family interest in home with hospice. [place] liaison spoke with patient's daughter Ronald [name] to confirm interest. Interest confirmed. Chart under review by [facility] physician.  ? ?Hospice eligibility confirmed.  ? ?Plan is to discharge home tomorrow via private vehicle once DME has been delivered.  ? ?DME in the home: hospital bed, over the bed table, oxygen, BSC ? ?DME needs: hospital bed, over the bed table, oxygen, and BSC ? ?Please send comfort medications/prescriptions home with patient.  ? ?Please call with any questions or concerns. Thank you ? ?Ronald Nova, LCSW ?[facility] Liaison ?[phone] ?

## 2022-03-09 NOTE — Progress Notes (Signed)
Subjective:  ?No specific complaints.  Breathing is not labored per patient.  Denies chest pain.  Still has leg edema. ? ?Intake/Output from previous day: ? ?I/O last 3 completed shifts: ?In: 1598.8 [P.O.:1558; I.V.:6; IV Piggyback:34.8] ?Out: 4900 [Urine:4900] ?No intake/output data recorded. ?Net IO Since Admission: -3,702.16 mL [03/09/22 0744]  ?Blood pressure 103/67, pulse 85, temperature 98.2 ?F (36.8 ?C), temperature source Oral, resp. rate 19, height 6' 3" (1.905 m), weight 98.4 kg, SpO2 96 %. ?Physical Exam ?Constitutional:   ?   General: He is not in acute distress. ?   Appearance: He is obese.  ?Neck:  ?   Vascular: JVD present.  ?Cardiovascular:  ?   Rate and Rhythm: Normal rate and regular rhythm.  ?   Pulses: Intact distal pulses.     ?     Dorsalis pedis pulses are 0 on the right side and 0 on the left side.  ?     Posterior tibial pulses are 0 on the right side and 0 on the left side.  ?   Heart sounds: Heart sounds are distant. Murmur heard.  ?Holosystolic murmur is present at the lower right sternal border.  ?  No gallop.  ?Pulmonary:  ?   Effort: Pulmonary effort is normal.  ?   Breath sounds: Normal breath sounds.  ?Abdominal:  ?   General: Bowel sounds are normal.  ?   Palpations: Abdomen is soft.  ?Musculoskeletal:     ?   General: Swelling (Left arm swelling and edema 2+ pitting) present.  ?   Right lower leg: Edema (3 place pitting entire right lower extremity edema, nontender.  Extensive ecchymosis involving the right leg and right thigh) present.  ?   Left lower leg: Edema (2+ entire left leg edema pitting.  No tenderness.) present.  ?Neurological:  ?   General: No focal deficit present.  ? ? ?Lab Results: ?BMP ?BNP (last 3 results) ?Recent Labs  ?  03/06/22 ?1154  ?BNP 605.5*  ? ? ?ProBNP (last 3 results) ?Recent Labs  ?  05/12/21 ?0855 06/03/21 ?1453 07/20/21 ?1036  ?PROBNP 2,423* 2,855* 1,291*  ? ? ?  Latest Ref Rng & Units 03/09/2022  ?  2:30 AM 03/08/2022  ?  1:44 AM 03/07/2022  ?  1:13 AM   ?BMP  ?Glucose 70 - 99 mg/dL 91   93   108    ?BUN 8 - 23 mg/dL _0 ?Creatinine 0.61 - 1.24 mg/dL 1.76   1.70   1.66    ?Sodium 135 - 145 mmol/L 140   140   141    ?Potassium 3.5 - 5.1 mmol/L 4.7   4.2   3.5    ?Chloride 98 - 111 mmol/L 104   106   107    ?CO2 22 - 32 mmol/L _1 ?Calcium 8.9 - 10.3 mg/dL 8.5   8.4   8.5    ? ? ?  Latest Ref Rng & Units 03/09/2022  ?  2:30 AM 03/08/2022  ?  1:44 AM 03/06/2022  ? 10:42 AM  ?Hepatic Function  ?Total Protein 6.5 - 8.1 g/dL 5.5   5.2   5.9    ?Albumin 3.5 - 5.0 g/dL 2.8   2.7   3.0    ?AST 15 - 41 U/L _2 ?ALT 0 - 44 U/L 9  8   7    ?Alk Phosphatase 38 - 126 U/L 57   58   67    ?Total Bilirubin 0.3 - 1.2 mg/dL 2.0   2.4   2.3    ? ? ?  Latest Ref Rng & Units 03/09/2022  ?  2:30 AM 03/08/2022  ?  1:44 AM 03/07/2022  ?  1:13 AM  ?CBC  ?WBC 4.0 - 10.5 K/uL 4.8   4.0   3.4    ?Hemoglobin 13.0 - 17.0 g/dL 10.2   9.8   9.3    ?Hematocrit 39.0 - 52.0 % 33.9   31.6   31.2    ?Platelets 150 - 400 K/uL 142   159   165    ? ?Lipid Panel  ?   ?Component Value Date/Time  ? CHOL 124 06/16/2020 0943  ? TRIG 68 06/16/2020 0943  ? HDL 51 06/16/2020 0943  ? CHOLHDL 2.5 03/25/2014 0235  ? VLDL 12 03/25/2014 0235  ? Study [place] 59 06/16/2020 0943  ? ?Cardiac Panel (last 3 results) ?No results for input(s): CKTOTAL, CKMB, TROPONINI, RELINDX in the last 72 hours. ? ?HEMOGLOBIN A1C ?Lab Results  ?Component Value Date  ? HGBA1C 5.8 (H) 03/26/2014  ? MPG 120 (H) 03/26/2014  ? ?TSH ?No results for input(s): TSH in the last 8760 hours. ? ?Radiology:  ?  ?CT ABDOMEN PELVIS WO CONTRAST 03/06/2022 ?No evidence of inguinal lymphadenopathy or soft tissue mass. Small left inguinal hernia which contains small amount of ascites. Severe anasarca. Gallbladder sludge versus tiny gallstones. No radiographic evidence of acute cholecystitis or biliary ductal dilatation. Moderate cardiomegaly ?  ?CT Angio Chest PE W and/or Wo Contrast 03/06/2022 ? 1. Suboptimal opacification of the pulmonary  arterial system with additional mild image degradation due to arms along the sides and anterior chest. Although no definitive emboli are visualized, findings are somewhat equivocal for pulmonary emboli. Consider repeat exam in 24 hours.  ?2. Moderate size bilateral pleural effusions with associated compressive atelectasis in the lung bases.  ?3. Moderate cardiomegaly with right atrial enlargement.  ?4. Aortic atherosclerosis. Atherosclerotic coronary artery disease. Aortic Atherosclerosis  ?  ?DG Chest Portable 1 View  03/06/2022 ? Increased left lower lobe consolidation. Increased atelectasis or infiltrate in right lung base. Stable cardiac enlargement.  ?  ?VAS [place] LOWER EXTREMITY VENOUS (DVT) (7a-7p) 03/07/2022 ?RIGHT: - There is no evidence of deep vein thrombosis in the lower extremity.  - No cystic structure found in the popliteal fossa. pulsatile waveforms and significant interstitial fluid noted   ?LEFT: - No evidence of common femoral vein obstruction. Pulsatile waveforms and significant interstitial fluid noted.  ?  ?UE VENOUS DUPLEX (7am - 7pm)  03/07/2022 ?Right: No evidence of thrombosis in the subclavian.   ?Left: No evidence of deep vein thrombosis in the upper extremity. No evidence of superficial vein thrombosis in the upper extremity. Significant interstitial fluid noted throughout the left upper extremity.   ? ? ?Cardiac Studies:  ? ?EKG: normal EKG, normal sinus rhythm, unchanged from previous tracings. ? ?Echocardiogram 03/07/2022:  ?1. Left ventricular ejection fraction, by estimation, is 20 to 25%. The left ventricle has severely decreased function. The left ventricle demonstrates global hypokinesis with some regional variation. Left ventricular diastolic parameters are  ?indeterminate. The interventricular septum is flattened in systole and diastole, consistent with right ventricular pressure and volume overload. ? 2. Right ventricular systolic function is moderately reduced. The right ventricular  size is moderately enlarged. There is moderately elevated pulmonary artery  systolic pressure. The estimated right ventricular systolic pressure is 84.6 mmHg. ? 3. Left atrial size was moderately dilated. ? 4. Right atrial size was severely dilated. ? 5. There is a trivial pericardial effusion posterior to the left ventricle. ? 6. The mitral valve is degenerative. Mild to moderate mitral valve regurgitation. ? 7. The tricuspid valve is abnormal ,incomplete coaptation with annular dilatation. Tricuspid valve regurgitation is severe. ? 8. Tiny, flickering echodensity at tip of noncoronary cusp. Possible Lambl's excrescence (normal variant), although if there is any suspicion for endocarditis would at least consider blood cultures. The aortic valve is tricuspid. Aortic valve  ?regurgitation is mild. Aortic regurgitation PHT measures 413 msec. ? 9. The inferior vena cava is dilated in size with <50% respiratory variability, suggesting right atrial pressure of 15 mmHg. ? ?Comparison(s): Prior images unable to be directly viewed, comparison made by report only. LVEF reportedly 25-30% in 2015. ? ? ?EKG: 03/08/2022: Atrial fibrillation with PVCs and rapid ventricular response at a rate of 161 bpm. ? ?Telemetry 03/09/2022: Atrial fibrillation with controlled ventricular response.  3 beat runs of VT, occasional PVCs. ? ?Scheduled Meds: ? docusate sodium  100 mg Oral BID  ? folic acid  1 mg Oral Daily  ? furosemide  20 mg Intravenous Q6H  ? isosorbide-hydrALAZINE  1 tablet Oral TID  ? metoprolol tartrate  25 mg Oral BID  ? mirtazapine  7.5 mg Oral Daily  ? multivitamin with minerals  1 tablet Oral Daily  ? potassium chloride  40 mEq Oral BID  ? QUEtiapine  25 mg Oral QHS  ? rivaroxaban  15 mg Oral Q supper  ? sodium chloride flush  3 mL Intravenous Q12H  ? ?Continuous Infusions: ?PRN Meds:.acetaminophen **OR** acetaminophen, bisacodyl, hydrALAZINE, ondansetron **OR** ondansetron (ZOFRAN) IV, polyethylene glycol,  traMADol ? ?Assessment  ? ?Ronald [name]  is a 88 y.o. AA male with history of dilated nonischemic cardiomyopathy, heart failure with reduced EF, permanent atrial fibrillation, nonobstructive CAD, hypertension, former smo

## 2022-03-09 NOTE — TOC Initial Note (Addendum)
Transition of Care (TOC) - Initial/Assessment Note  ? ? ?Patient Details  ?Name: Ronald [name] ?MRN: [id number] ?Date of Birth: 1934/06/05 ? ?Transition of Care (TOC) CM/SW Contact:    ?Graves-Bigelow, Ocie Cornfield, RN ?Phone Number: ?03/09/2022, 2:50 PM ? ?Clinical Narrative:  Case Manager received a secure chat message from the provider stating to arrange home hospice. Case Manager did call the daughter to discuss home hospice. Pt was currently active with [facility] prior to hospitalization. Since admission [facility] had to revoke services since they do not have a contract with [facility]. [facility] is agreeable to accept the patient back and DME is in the home. Case Manager spoke with the daughter and she states she was unhappy with the RN services and wants [facility] to call her. Medicare.gov list provided to the patients daughter. She is asking for an aide 3 x week via [facility]. If the patient/family goes with [facility] DME will need to be removed and [facility] will need to deliver HB, WC, Overbed table, oxygen, and BSC to the home (then d/c will be tomorrow). Per daughter, patient can transport via car. Awaiting on daughter to see what agency she wants to use. [facility] is calling the daughter now to discuss hospice services.       ? ?1620 03-09-22 [facility] reached out to the family regarding Hospice Services. They can accept the patient and the daughter is agreeable to services. Case Manager called [facility] and cancelled services- DME to be removed from the home. [facility] to provide DME above to the home by tomorrow. Patients roommate to pick the patient up from the hospital on Wednesday for transport home via private vehicle. DNR to be signed and on the shadow chart. No further needs identified at this time.   ? ? ?Expected Discharge Plan: [place].  ?  ?Patient Goals and CMS Choice ?Patient states their goals for this  hospitalization and ongoing recovery are:: family wants patient to return home with Hospice Services. ?CMS Medicare.gov Compare Post Acute Care list provided to:: Patient Represenative (must comment) (daughter Jeanett Schlein) ?Choice offered to / list presented to : Adult Children ? ?Expected Discharge Plan and Services ?Expected Discharge Plan: [place] ?In-house Referral: NA ?  ?Post Acute Care Choice: Hospice ?Living arrangements for the past 2 months: [place] ?Expected Discharge Date: 03/09/22               ?DME Arranged: Hospital bed, Lightweight manual wheelchair with seat cushion, Bedside commode, Oxygen, Overbed table ?DME Agency: [facility] [place] ([place] if the daughter chooses to go with them.) ?Date DME Agency Contacted: 03/09/22 ?Time DME Agency Contacted: 8416 ?Representative spoke with at DME Agency: Anderson Malta ?HH Arranged: RN ?[place] Agency: [facility] and [facility]) ?Date HH Agency Contacted: 03/09/22 ?Time [place]: 6063 ?Representative spoke with at [place]: Anderson Malta ? ?Prior Living Arrangements/Services ?Living arrangements for the past 2 months: [place] ?Lives with:: Adult Children ?Patient language and need for interpreter reviewed:: Yes ?Do you feel safe going back to the place where you live?: Yes      ?Need for Family Participation in Patient Care: Yes (Comment) ?Care giver support system in place?: Yes (comment) ?Current home services: DME (Patient has [place] with [facility]) ?Criminal Activity/Legal Involvement Pertinent to Current Situation/Hospitalization: No - Comment as  needed ? ?Permission Sought/Granted ?Permission sought to share information with : Family Supports, Customer service manager, Case Manager ?Permission granted to share information with : Yes, Verbal Permission Granted ?   ? Permission granted to share info w AGENCY:  [facility]. ?   ?   ? ?Emotional Assessment ?Appearance:: Appears stated age ?Attitude/Demeanor/Rapport: Engaged ?  ?Orientation: : Oriented to Situation, Oriented to  Time, Oriented to Self ?Alcohol / Substance Use: Not Applicable ?Psych Involvement: No (comment) ? ?Admission diagnosis:  Edema [R60.9] ?Anasarca [R60.1] ?Atrial fibrillation with RVR ([place]) [I48.91] ?Patient Active Problem List  ? Diagnosis Date Noted  ? Malnutrition of moderate degree 03/08/2022  ? Atrial fibrillation with RVR ([place])   ? Anasarca 03/06/2022  ? Pain due to onychomycosis of toenails of both feet 11/11/2021  ? Lewy body dementia with behavioral disturbance ([place]) 10/02/2021  ? Dementia with behavioral disturbance ([place]) 09/02/2021  ? NPH (normal pressure hydrocephalus) ([place]) 09/02/2021  ? Neutropenia ([place]) 09/24/2019  ? Thrombocytopenia ([place]) 09/03/2019  ? Nonischemic cardiomyopathy ([place]) 06/08/2019  ? Nonobstructive atherosclerosis of coronary artery 06/08/2019  ? Right upper quadrant abdominal pain 02/09/2017  ? Other hydrocele 09/03/2016  ? Itching with irritation 03/08/2016  ? Hypertension 12/04/2015  ? Idiopathic chronic gout of right ankle without tophus 12/04/2015  ? Pure hypercholesterolemia 12/04/2015  ? Venous stasis 12/04/2015  ? Acute on chronic HFrEF (heart failure with reduced ejection fraction) ([place]) 03/26/2014  ? Community acquired pneumonia 03/24/2014  ? Acute diastolic CHF (congestive heart failure) ([place]) 03/24/2014  ? Permanent atrial fibrillation ([place]) 03/24/2014  ? Bloating 01/16/2014  ? Weight gain 01/16/2014  ? Nocturia 09/12/2013  ? Benign localized hyperplasia of prostate without urinary obstruction and other lower urinary tract symptoms (LUTS) 07/12/2012  ? IBS (irritable bowel syndrome) 01/27/2012  ? GERD (gastroesophageal reflux disease) 01/27/2012  ? B12 deficiency 01/27/2012  ? VITAMIN B12 DEFICIENCY 11/23/2010  ? COLONIC POLYPS, HX OF 11/20/2010  ? HYPERLIPIDEMIA 11/19/2010   ? Hypertensive heart disease with heart failure ([place]) 11/19/2010  ? ALLERGIC RHINITIS 11/19/2010  ? REFLUX ESOPHAGITIS 11/19/2010  ? IRRITABLE BOWEL SYNDROME 11/19/2010  ? ?PCP:  Heywood Bene, PA-C ?Pharmacy:   ?[place], [place] [address] ?[address] ?[place] [address] ?Phone: [phone] Fax: [phone] ? ?[facility] [place], [address] [place] ?[address] ?[place] [address] ?Phone: [phone] Fax: [phone] ? ?[id number] [facility] [address] [address]?Phone: [phone]: [phone]? ?[phone] ?[place]?Phone: [phone]: [phone]? ? ?Readmission Risk Interventions ?   ? View : No data to display.  ?  ?  ?  ? ? ? ?

## 2022-03-10 DIAGNOSIS — R601 Generalized edema: Secondary | ICD-10-CM

## 2022-03-10 NOTE — TOC Progression Note (Signed)
Transition of Care (TOC) - Progression Note  ? ? ?Patient Details  ?Name: Ronald [name] ?MRN: [id number] ?Date of Birth: 02/29/1934 ? ?Transition of Care (TOC) CM/SW Contact  ?Carles Collet, RN ?Phone Number: ?03/10/2022, 9:57 AM ? ?Clinical Narrative:    ?Spoke w patient's daughter to discuss DC plan for today. She states that the patient has a caregiver Jeneen Rinks at the home who will help coordinate the delivery of the equipment. She provided Jeneen Rinks' number and granted permission to speak with him. Madlyn Frankel confirmed that either or Jeneen Rinks will be available to pick up the patient at DC and that she has taken off work today. She would like to continue to be updated on DME and time of DC.  ? ?Spoke w Jeneen Rinks and he was unaware that equipment was going to be delivered to the house today. He states that the patient has hospital bed and oxygen at home currently. ?He agreed to stay home today awaiting further communication on plan for DME. ? ?Spoke w [facility] liaison Sheela Stack who will assist exchange of DME at the house today. Danise Mina was provided with Jeneen Rinks' phone number.  ? ? ? ?Caregiver Carmelina Noun [phone] ?Antony Contras (Daughter)  ?[phone]  ? ? ? ?Expected Discharge Plan: [place] ?  ? ?Expected Discharge Plan and Services ?Expected Discharge Plan: [place] ?In-house Referral: NA ?  ?Post Acute Care Choice: Hospice ?Living arrangements for the past 2 months: [place] ?Expected Discharge Date: 03/10/22               ?DME Arranged: Hospital bed, Lightweight manual wheelchair with seat cushion, Bedside commode, Oxygen, Overbed table ?DME Agency: [facility] [place] ([place] if the daughter chooses to go with them.) ?Date DME Agency Contacted: 03/09/22 ?Time DME Agency Contacted: 4403 ?Representative spoke with at DME Agency: Anderson Malta ?HH Arranged: RN ?[place] Agency: Hospice and [facility]) ?Date HH Agency  Contacted: 03/09/22 ?Time [place]: 4742 ?Representative spoke with at [place]: Anderson Malta ? ? ?Social Determinants of Health (SDOH) Interventions ?  ? ?Readmission Risk Interventions ?   ? View : No data to display.  ?  ?  ?  ? ? ?

## 2022-03-10 NOTE — Progress Notes (Signed)
Occupational Therapy Treatment ?Patient Details ?Name: Ronald [name] ?MRN: [id number] ?DOB: 1934/10/07 ?Today's Date: 03/10/2022 ? ? ?History of present illness 88 y.o. male adm 5/6 with arm/leg edema after insect sting 1.5 weeks ago. Pt with Afib with RVR, bil pleural effusions and anascara. PMH: afib on Xarelto; dementia; BPH; HTN; HLD; chronic diastolic CHF; and OSA ?  ?OT comments ? Patient received in supine and agreeable to OT session. Patient was supervision to get to EOB and performed grooming tasks with setup and supervision. Patient performed transfers to/from recliner to/from EOB to simulate toilet transfers with min guard assist. Patient performed static standing from EOB to increase standing tolerance with self care with patient tolerating 1-2 minutes. Patient returned to supine. Recommendations continue for HHOT and 24 hour supervision.   ? ?Recommendations for follow up therapy are one component of a multi-disciplinary discharge planning process, led by the attending physician.  Recommendations may be updated based on patient status, additional functional criteria and insurance authorization. ?   ?Follow Up Recommendations ? Home health OT  ?  ?Assistance Recommended at Discharge Frequent or constant Supervision/Assistance  ?Patient can return home with the following ? A little help with walking and/or transfers;A little help with bathing/dressing/bathroom;Direct supervision/assist for medications management;Direct supervision/assist for financial management;Assist for transportation ?  ?Equipment Recommendations ? None recommended by OT  ?  ?Recommendations for Other Services   ? ?  ?Precautions / Restrictions Precautions ?Precautions: Fall ?Restrictions ?Weight Bearing Restrictions: No  ? ? ?  ? ?Mobility Bed Mobility ?Overal bed mobility: Needs Assistance ?Bed Mobility: Sit to Supine, Supine to Sit ?  ?  ?Supine to sit: Supervision ?Sit to supine: Supervision ?  ?General bed mobility comments:  patient was able to perform bed mobility with supervision ?  ? ?Transfers ?Overall transfer level: Needs assistance ?Equipment used: Rolling walker (2 wheels) ?Transfers: Sit to/from Stand, Bed to chair/wheelchair/BSC ?Sit to Stand: Min guard ?  ?  ?Step pivot transfers: Min guard ?  ?  ?General transfer comment: performed transfer training from EOB to recliner to simulate toilet transfers with cues for hand placement and safety ?  ?  ?Balance Overall balance assessment: Needs assistance ?Sitting-balance support: Feet supported, No upper extremity supported ?Sitting balance-Leahy Scale: Fair ?Sitting balance - Comments: able to sit on EOB and perform grooming tasks ?  ?Standing balance support: Bilateral upper extremity supported, Reliant on assistive device for balance ?Standing balance-Leahy Scale: Poor ?Standing balance comment: performed static standing to increase standing for self care with patient tolerating 1-2 minutes of standing ?  ?  ?  ?  ?  ?  ?  ?  ?  ?  ?  ?   ? ?ADL either performed or assessed with clinical judgement  ? ?ADL Overall ADL's : Needs assistance/impaired ?  ?  ?Grooming: Wash/dry hands;Wash/dry face;Oral care;Set up;Sitting ?Grooming Details (indicate cue type and reason): seated on EOB ?  ?  ?  ?  ?  ?  ?Lower Body Dressing: Maximal assistance ?Lower Body Dressing Details (indicate cue type and reason): to donn compression stockings and socks ?Toilet Transfer: Min guard;Rolling walker (2 wheels) ?Toilet Transfer Details (indicate cue type and reason): simulated to recliner ?  ?  ?  ?  ?  ?General ADL Comments: increased mobility and transfers with limited standing tolerance ?  ? ?Extremity/Trunk Assessment   ?  ?  ?  ?  ?  ? ?Vision   ?  ?  ?Perception   ?  ?  Praxis   ?  ? ?Cognition Arousal/Alertness: Awake/alert ?Behavior During Therapy: [facility] for tasks assessed/performed ?Overall Cognitive Status: History of cognitive impairments - at baseline ?  ?  ?  ?  ?  ?  ?  ?  ?  ?  ?  ?  ?  ?  ?   ?  ?General Comments: was aware of being in [facility] hospital, not oriented to time ?  ?  ?   ?Exercises   ? ?  ?Shoulder Instructions   ? ? ?  ?General Comments    ? ? ?Pertinent Vitals/ Pain       Pain Assessment ?Pain Assessment: No/denies pain ? ?Home Living   ?  ?  ?  ?  ?  ?  ?  ?  ?  ?  ?  ?  ?  ?  ?  ?  ?  ?  ? ?  ?Prior Functioning/Environment    ?  ?  ?  ?   ? ?Frequency ? Min 2X/week  ? ? ? ? ?  ?Progress Toward Goals ? ?OT Goals(current goals can now be found in the care plan section) ? Progress towards OT goals: Progressing toward goals ? ?Acute Rehab OT Goals ?OT Goal Formulation: With patient ?Time For Goal Achievement: 03/16/22 ?Potential to Achieve Goals: Good ?ADL Goals ?Pt Will Perform Upper Body Bathing: with supervision ?Pt Will Perform Lower Body Bathing: with mod assist;with adaptive equipment ?Pt Will Perform Upper Body Dressing: with supervision;with set-up ?Pt Will Perform Lower Body Dressing: with mod assist;with adaptive equipment ?Pt Will Transfer to Toilet: with modified independence ?Pt Will Perform Toileting - Clothing Manipulation and hygiene: with modified independence  ?Plan Discharge plan remains appropriate   ? ?Co-evaluation ? ? ?   ?  ?  ?  ?  ? ?  ?AM-PAC OT "6 Clicks" Daily Activity     ?Outcome Measure ? ? Help from another person eating meals?: A Little ?Help from another person taking care of personal grooming?: A Little ?Help from another person toileting, which includes using toliet, bedpan, or urinal?: A Little ?Help from another person bathing (including washing, rinsing, drying)?: A Little ?Help from another person to put on and taking off regular upper body clothing?: A Little ?Help from another person to put on and taking off regular lower body clothing?: A Lot ?6 Click Score: 17 ? ?  ?End of Session Equipment Utilized During Treatment: Gait belt;Rolling walker (2 wheels) ? ?OT Visit Diagnosis: Muscle weakness (generalized) (M62.81) ?  ?Activity Tolerance Patient  tolerated treatment well ?  ?Patient Left in bed;with call bell/phone within reach;with bed alarm set ?  ?Nurse Communication Mobility status ?  ? ?   ? ?Time: 0867-6195 ?OT Time Calculation (min): 32 min ? ?Charges: OT General Charges ?$OT Visit: 1 Visit ?OT Treatments ?$Self Care/Home Management : 8-22 mins ?$Therapeutic Activity: 8-22 mins ? ?Lodema Hong, OTA ?Acute Rehabilitation Services  ?Pager [phone] ?Office [phone] ? ? ?[place] ?03/10/2022, 10:37 AM ?

## 2022-03-10 NOTE — Progress Notes (Signed)
Physical Therapy Treatment ?Patient Details ?Name: Ronald [name] ?MRN: [id number] ?DOB: 1933-12-27 ?Today's Date: 03/10/2022 ? ? ?History of Present Illness 88 y.o. male adm 5/6 with arm/leg edema after insect sting 1.5 weeks ago. Pt with Afib with RVR, bil pleural effusions and anascara. PMH: afib on Xarelto; dementia; BPH; HTN; HLD; chronic diastolic CHF; and OSA ? ?  ?PT Comments  ? ? Pt pleasant with lack of recall for time in chair or statements within conversation. Pt with HR 95 at rest up to max 142 with exercise and 135 with gait. Pt educated for safety, transfers, and gait without significant carryover. 24hr supervision remains appropriate.  ?   ?Recommendations for follow up therapy are one component of a multi-disciplinary discharge planning process, led by the attending physician.  Recommendations may be updated based on patient status, additional functional criteria and insurance authorization. ? ?Follow Up Recommendations ? Home health PT ?  ?  ?Assistance Recommended at Discharge Frequent or constant Supervision/Assistance  ?Patient can return home with the following A little help with walking and/or transfers;A lot of help with bathing/dressing/bathroom;Assistance with cooking/housework;Direct supervision/assist for medications management;Direct supervision/assist for financial management;Assist for transportation;Help with stairs or ramp for entrance ?  ?Equipment Recommendations ? None recommended by PT  ?  ?Recommendations for Other Services   ? ? ?  ?Precautions / Restrictions Precautions ?Precautions: Fall  ?  ? ?Mobility ? Bed Mobility ?Overal bed mobility: Needs Assistance ?Bed Mobility: Sit to Supine ?  ?  ?  ?Sit to supine: Min assist ?  ?General bed mobility comments: pt in chair on arrival, min assist to lift legs to return to supine ?  ? ?Transfers ?Overall transfer level: Needs assistance ?  ?Transfers: Sit to/from Stand ?Sit to Stand: Min guard ?  ?  ?  ?  ?  ?General transfer comment:  cues for hand placement and foot position to rise from recliner. ?  ? ?Ambulation/Gait ?Ambulation/Gait assistance: Min assist ?Gait Distance (Feet): 80 Feet ?Assistive device: Rolling walker (2 wheels) ?Gait Pattern/deviations: Step-through pattern, Trunk flexed, Decreased stride length ?  ?Gait velocity interpretation: <1.8 ft/sec, indicate of risk for recurrent falls ?  ?General Gait Details: cues for posture, proximity to RW, direction and safety, pt able to self-regulate distance ? ? ?Stairs ?  ?  ?  ?  ?  ? ? ?Wheelchair Mobility ?  ? ?Modified Rankin (Stroke Patients Only) ?  ? ? ?  ?Balance Overall balance assessment: Needs assistance ?Sitting-balance support: Feet supported, No upper extremity supported ?Sitting balance-Leahy Scale: Fair ?Sitting balance - Comments: pt able to static sit without assist, LOB with LE HEP ?  ?Standing balance support: Bilateral upper extremity supported, Reliant on assistive device for balance ?Standing balance-Leahy Scale: Poor ?Standing balance comment: bil UE on rW throughout standing ?  ?  ?  ?  ?  ?  ?  ?  ?  ?  ?  ?  ? ?  ?Cognition Arousal/Alertness: Awake/alert ?Behavior During Therapy: [facility] for tasks assessed/performed ?Overall Cognitive Status: History of cognitive impairments - at baseline ?  ?  ?  ?  ?  ?  ?  ?  ?  ?  ?  ?  ?  ?  ?  ?  ?General Comments: pt with decreased orientation and STM, pleasant and following 2 step commands ?  ?  ? ?  ?Exercises General Exercises - Lower Extremity ?Long Arc Quad: AROM, Both, Seated, 10 reps ?Hip Flexion/Marching: AROM, Both, Seated,  10 reps ? ?  ?General Comments   ?  ?  ? ?Pertinent Vitals/Pain Pain Assessment ?Pain Assessment: No/denies pain  ? ? ?Home Living   ?  ?  ?  ?  ?  ?  ?  ?  ?  ?   ?  ?Prior Function    ?  ?  ?   ? ?PT Goals (current goals can now be found in the care plan section) Progress towards PT goals: Progressing toward goals ? ?  ?Frequency ? ? ? Min 3X/week ? ? ? ?  ?PT Plan Current plan remains  appropriate  ? ? ?Co-evaluation   ?  ?  ?  ?  ? ?  ?AM-PAC PT "6 Clicks" Mobility   ?Outcome Measure ? Help needed turning from your back to your side while in a flat bed without using bedrails?: A Little ?Help needed moving from lying on your back to sitting on the side of a flat bed without using bedrails?: A Little ?Help needed moving to and from a bed to a chair (including a wheelchair)?: A Little ?Help needed standing up from a chair using your arms (e.g., wheelchair or bedside chair)?: A Little ?Help needed to walk in hospital room?: A Little ?Help needed climbing 3-5 steps with a railing? : A Lot ?6 Click Score: 17 ? ?  ?End of Session Equipment Utilized During Treatment: Gait belt ?Activity Tolerance: Patient tolerated treatment well ?Patient left: in bed;with call bell/phone within reach;with bed alarm set ?Nurse Communication: Mobility status ?PT Visit Diagnosis: Muscle weakness (generalized) (M62.81);Difficulty in walking, not elsewhere classified (R26.2);Other abnormalities of gait and mobility (R26.89) ?  ? ? ?Time: 7616-0737 ?PT Time Calculation (min) (ACUTE ONLY): 19 min ? ?Charges:  $Gait Training: 8-22 mins          ?          ? ?Ronald [name] P, PT ?Acute Rehabilitation Services ?Pager: [phone] ?Office: [phone] ? ? ? ?Ronald [name] ?03/10/2022, 9:06 AM ? ?

## 2022-03-10 NOTE — Care Management Important Message (Signed)
Important Message ? ?Patient Details  ?Name: Ronald [name] ?MRN: [id number] ?Date of Birth: [birth year] ? ? ?Medicare Important Message Given:  Yes ? ? ? ? ?Shelda Altes ?03/10/2022, 8:25 AM ?

## 2022-03-10 NOTE — Progress Notes (Addendum)
Heart Failure Navigator Progress Note ? ?Assessed for Heart & Vascular TOC clinic readiness.  ?Patient does not meet criteria due to patient is going home hospice. .  ? ? ? ?Earnestine Leys, BSN, RN ?Heart Failure Nurse Navigator ?Secure Chat Only   ?

## 2022-03-10 NOTE — TOC Transition Note (Signed)
Transition of Care (TOC) - CM/SW Discharge Note ? ? ?Patient Details  ?Name: Ronald [name] ?MRN: [id number] ?Date of Birth: 1934/06/29 ? ?Transition of Care (TOC) CM/SW Contact:  ?Graves-Bigelow, Ocie Cornfield, RN ?Phone Number: ?03/10/2022, 2:36 PM ? ? ?Clinical Narrative:  [place] [facility] is agreeable to leave the DME in the home until 03-11-22. [facility] and [facility] will have to correlate times to meet to transfer DME in the home and out of the home. Case Manager did call Satira Anis for [facility] and she is working with [facility] [place] to secure a time for delivery and pickup. Patient will have DME in the home. Patient will be able to transport via private vehicle today. No further needs from Case Manager at this time.  ? ? ? ?Final next level of care: [place] ?  ? ? ?Patient Goals and CMS Choice ?Patient states their goals for this hospitalization and ongoing recovery are:: family wants patient to return home with Hospice Services. ?CMS Medicare.gov Compare Post Acute Care list provided to:: Patient Represenative (must comment) (daughter Jeanett Schlein) ?Choice offered to / list presented to : Adult Children ? ?Discharge Plan and Services ?In-house Referral: NA ?  ?Post Acute Care Choice: Hospice          ?DME Arranged: Hospital bed, Lightweight manual wheelchair with seat cushion, Bedside commode, Oxygen, Overbed table ?DME Agency: Hospice and [place] ([place] if the daughter chooses to go with them.) ?Date DME Agency Contacted: 03/09/22 ?Time DME Agency Contacted: 8016 ?Representative spoke with at DME Agency: Anderson Malta ?HH Arranged: RN ?[place] Agency: Hospice and [facility]) ?Date HH Agency Contacted: 03/09/22 ?Time [place]: 5537 ?Representative spoke with at [place]: Anderson Malta ? ?Readmission Risk Interventions ?   ? View : No data to display.  ?  ?  ?  ? ? ? ? ? ?

## 2022-04-05 ENCOUNTER — Encounter: Payer: Self-pay | Admitting: Internal Medicine

## 2024-02-25 IMAGING — CT CT ABD-PELV W/O CM
2 of 4 series · 16 of 46 positions shown, 18 images · non-contrast
Comparison: None Available.

CLINICAL DATA: Groin lymphadenopathy.



[Series 3: abd/ pelvis 5.0 i30f 2 · axial · 0.87mm/px · z∈[+1020,+1460]mm · 13 of 98 slices shown, 15 images]
[im 5/98  soft-tissue]
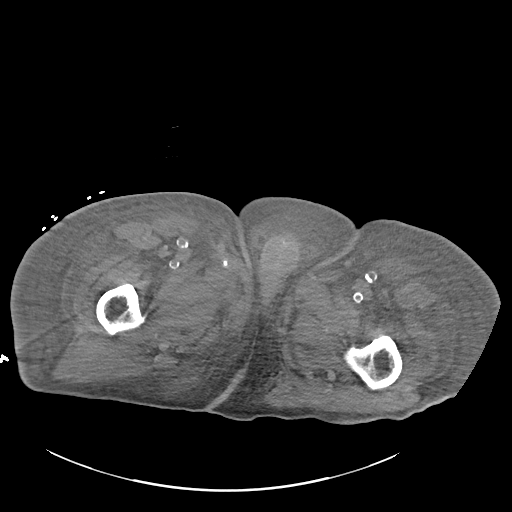
[im 5/98  bone]
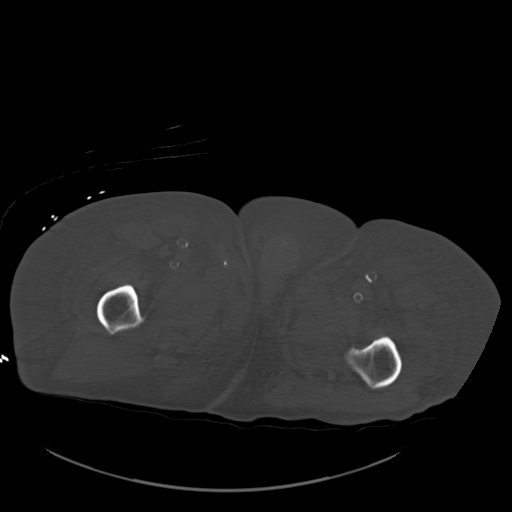
[im 13/98  soft-tissue]
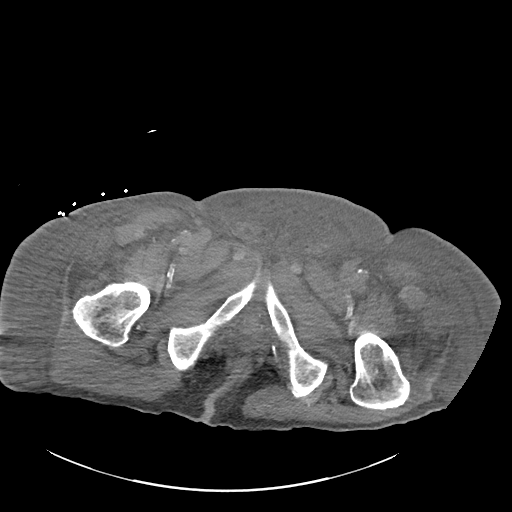
[im 21/98  soft-tissue]
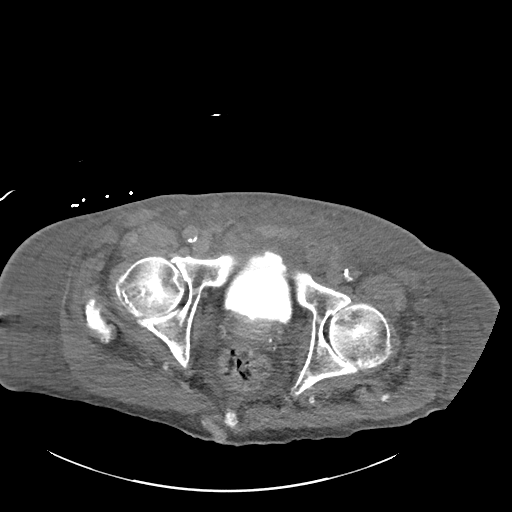
[im 29/98  soft-tissue]
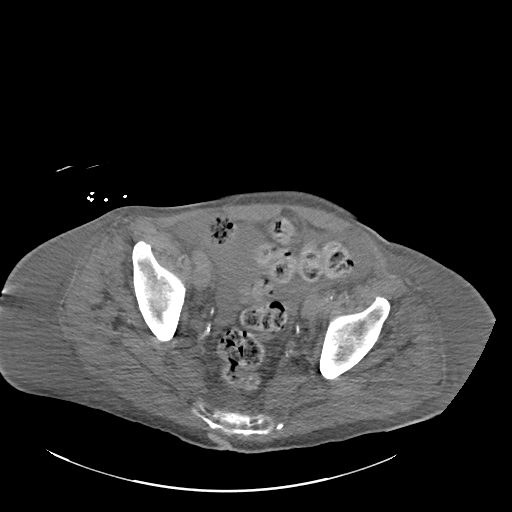
[im 33/98  soft-tissue]
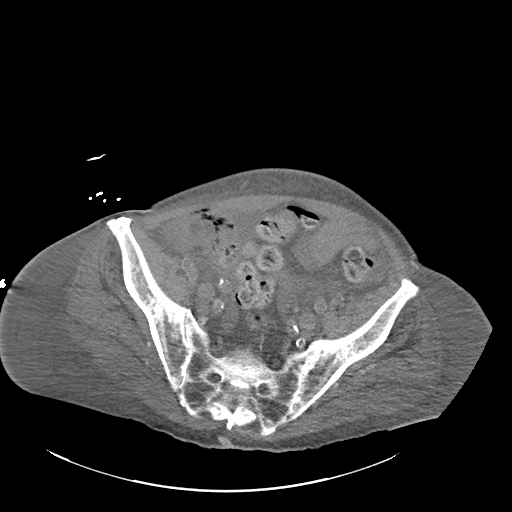
[im 41/98  soft-tissue]
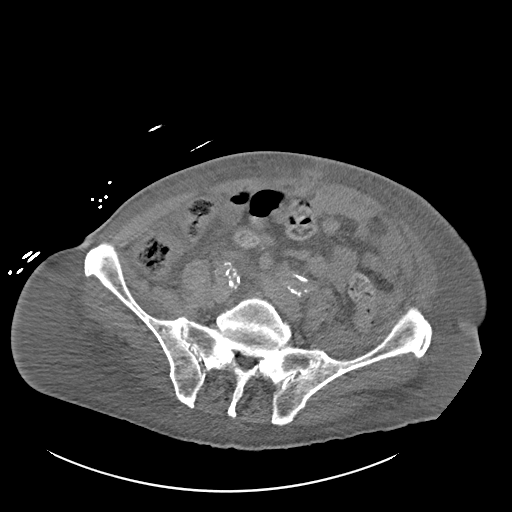
[im 49/98  soft-tissue]
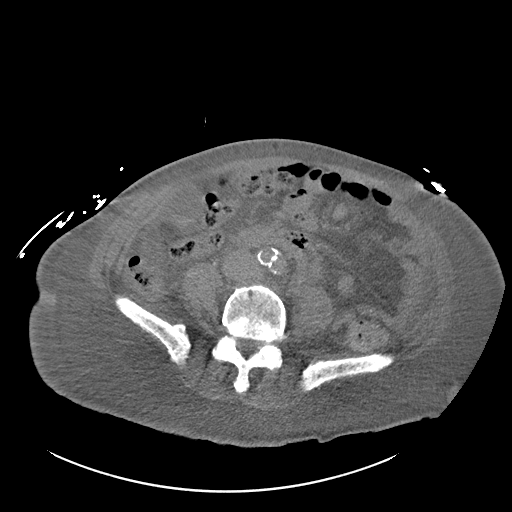
[im 57/98  soft-tissue]
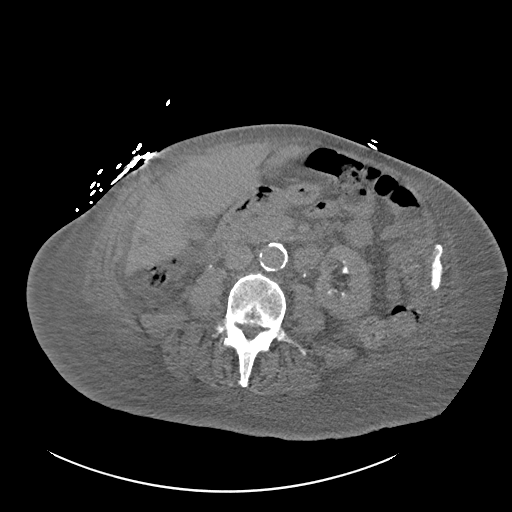
[im 65/98  soft-tissue]
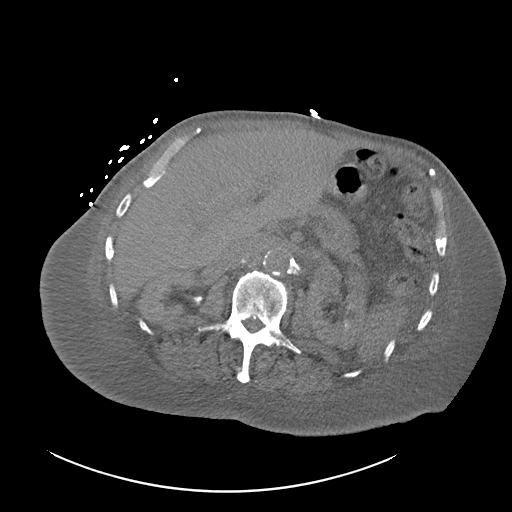
[im 65/98  bone]
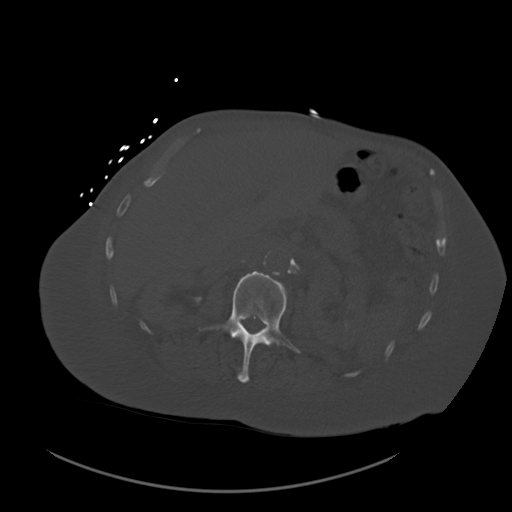
[im 69/98  soft-tissue]
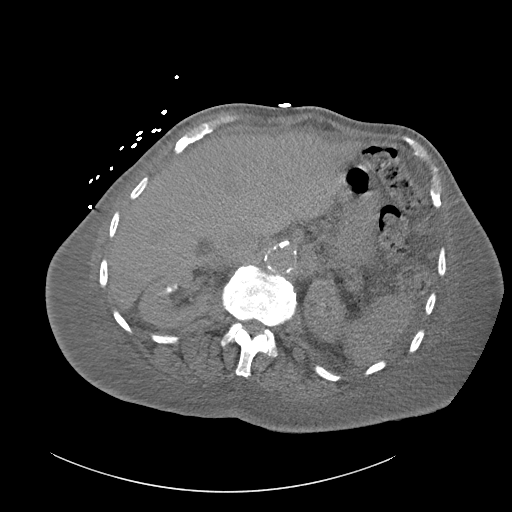
[im 77/98  soft-tissue]
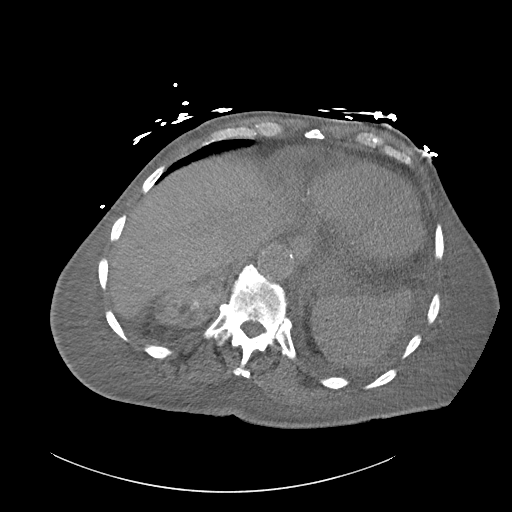
[im 85/98  soft-tissue]
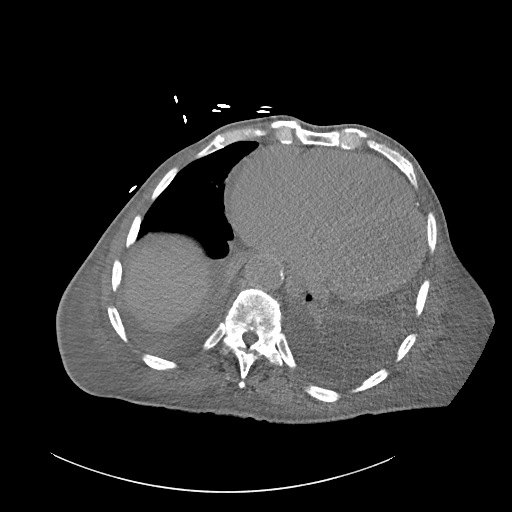
[im 93/98  soft-tissue]
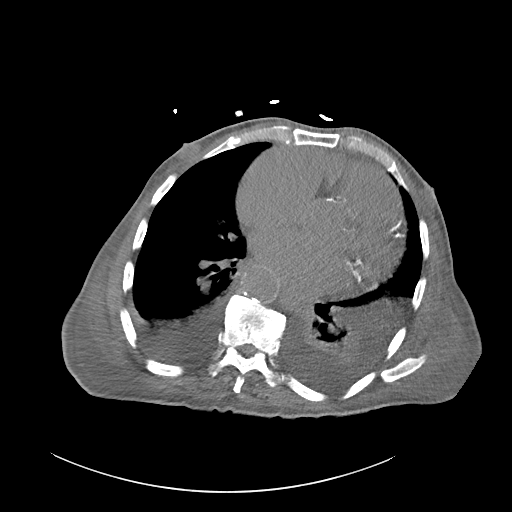

[Series 6: cor st · coronal · 0.79mm/px · 3 of 103 slices shown]
[im 35/103  soft-tissue]
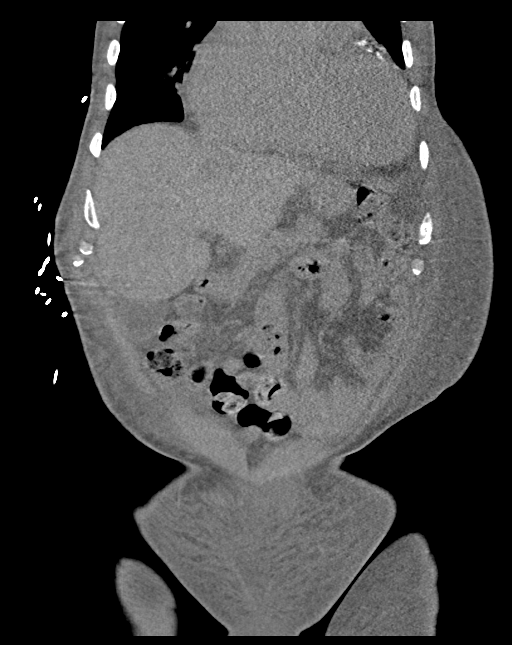
[im 46/103  soft-tissue]
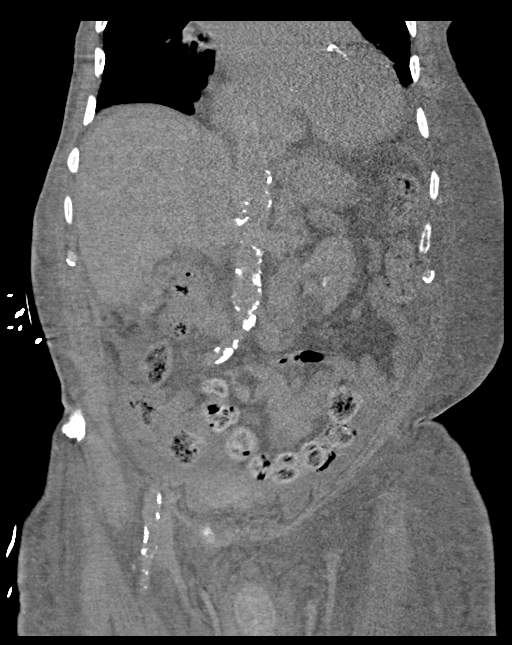
[im 57/103  soft-tissue]
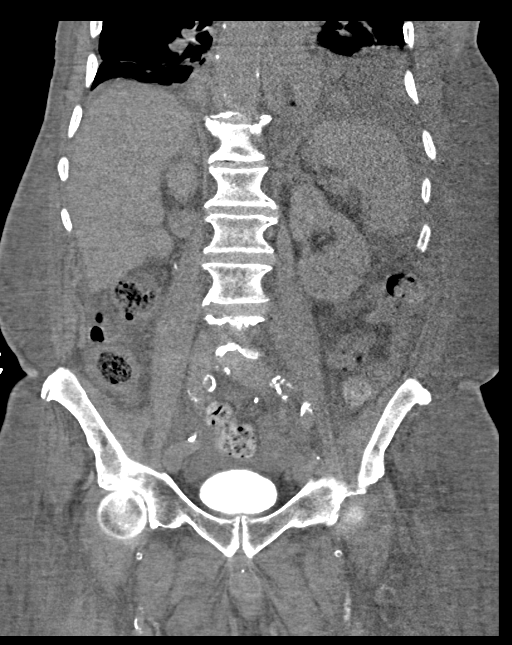

[16 of 46 positions shown; findings below may reference images not displayed]

FINDINGS: Lower chest: Moderate bilateral pleural effusions and bibasilar
atelectasis. Moderate cardiomegaly.

Hepatobiliary: No mass visualized on this unenhanced exam. Several
small fluid attenuation hepatic cysts noted. Mild high attenuation
gallbladder sludge versus tiny gallstones. No evidence of acute
cholecystitis or biliary ductal dilatation.

Pancreas: No mass or inflammatory process visualized on this
unenhanced exam.

Spleen:  Within normal limits in size.

Adrenals/Urinary tract: Small fluid attenuation renal cysts seen
bilaterally (no followup imaging is recommended). Contrast within
renal collecting systems and bladder from recent chest CTA. No
evidence of hydronephrosis.

Stomach/Bowel: No evidence of obstruction, inflammatory process, or
abscess. Diffuse mesenteric edema and mild abdominal and pelvic
ascites is noted.

Vascular/Lymphatic: No pathologically enlarged lymph nodes
identified. No evidence of abdominal aortic aneurysm. Aortic
atherosclerotic calcification noted.

Reproductive:  No mass or other significant abnormality.

Other: Severe diffuse body wall edema. A small left inguinal hernia
is seen which contains only fluid. No herniated bowel loops. No
evidence of inguinal lymphadenopathy or soft tissue mass.

Musculoskeletal:  No suspicious bone lesions identified.
IMPRESSION: No evidence of inguinal lymphadenopathy or soft tissue mass. Small
left inguinal hernia which contains small amount of ascites.

Severe anasarca.

Gallbladder sludge versus tiny gallstones. No radiographic evidence
of acute cholecystitis or biliary ductal dilatation.

Moderate cardiomegaly.

## 2024-02-25 IMAGING — CT CT ANGIO CHEST
2 of 6 series · 18 of 36 positions shown · IV contrast (agent unspecified)
Comparison: None Available.

CLINICAL DATA: Shortness of breath.

EXAM:
CT ANGIOGRAPHY CHEST WITH CONTRAST
TECHNIQUE: Multidetector CT imaging of the chest was performed using the
standard protocol during bolus administration of intravenous
contrast. Multiplanar CT image reconstructions and MIPs were
obtained to evaluate the vascular anatomy.

[Series 7: pe thins · axial · 0.82mm/px · z∈[+1468,+1740]mm · 17 of 432 slices shown]
[im 22/432  lung]
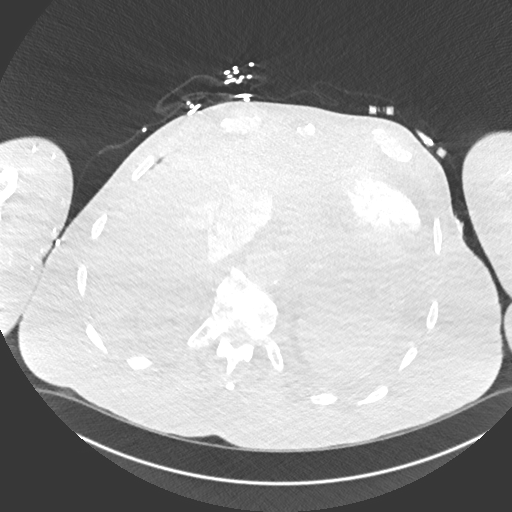
[im 44/432  mediastinal]
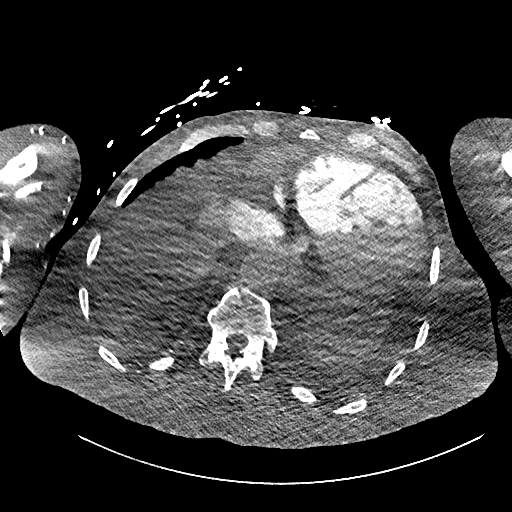
[im 65/432  lung]
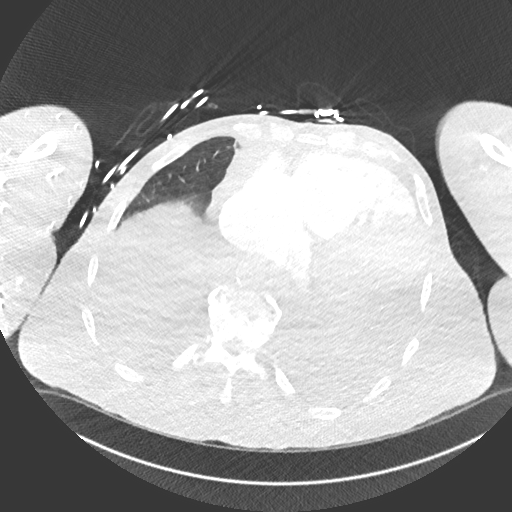
[im 87/432  mediastinal]
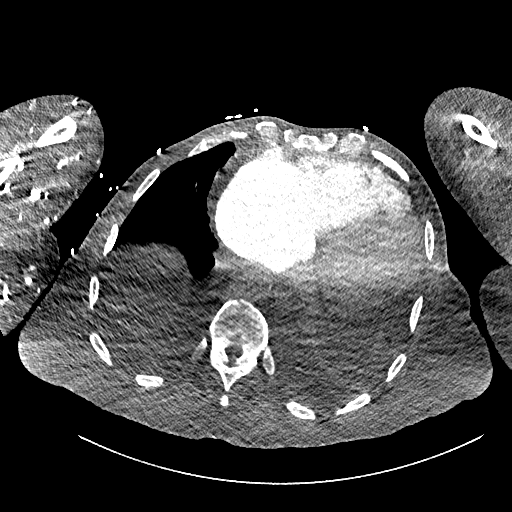
[im 130/432  lung]
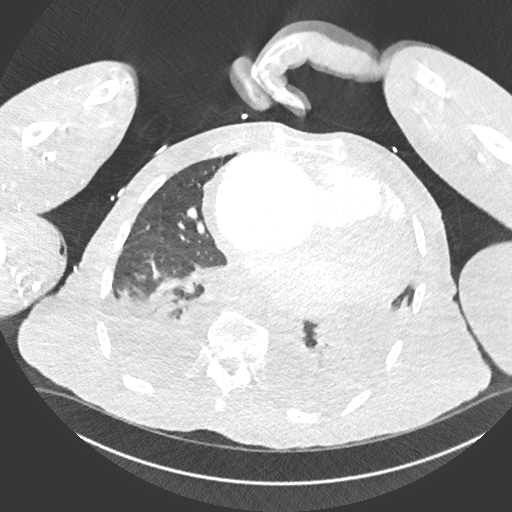
[im 151/432  mediastinal]
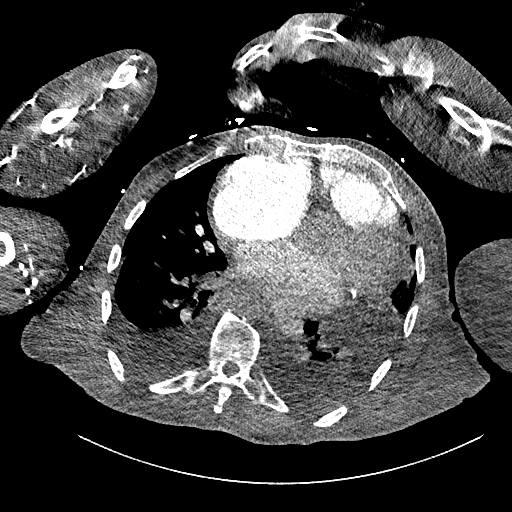
[im 173/432  lung]
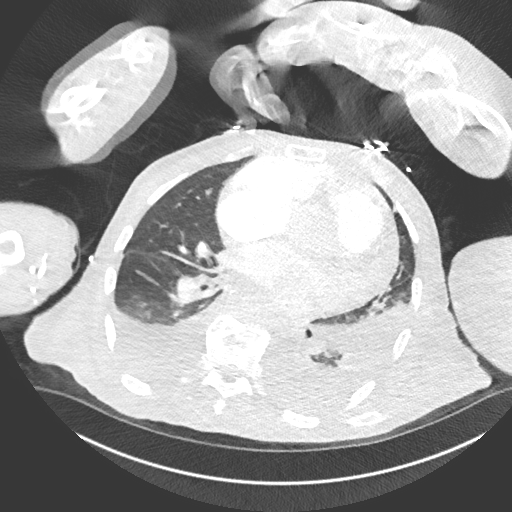
[im 194/432  mediastinal]
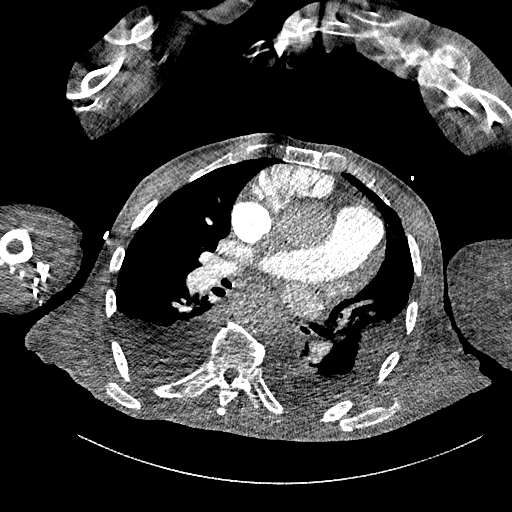
[im 216/432  lung]
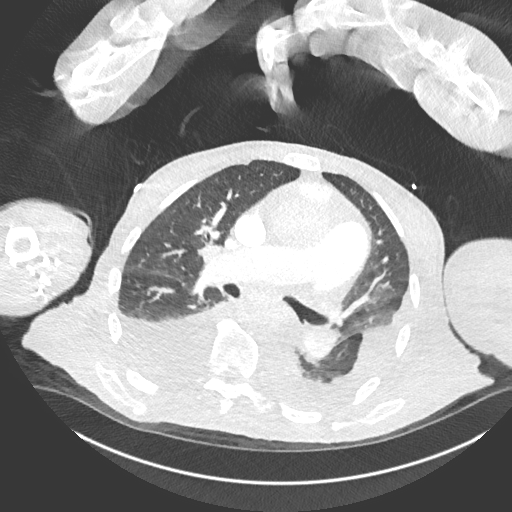
[im 238/432  mediastinal]
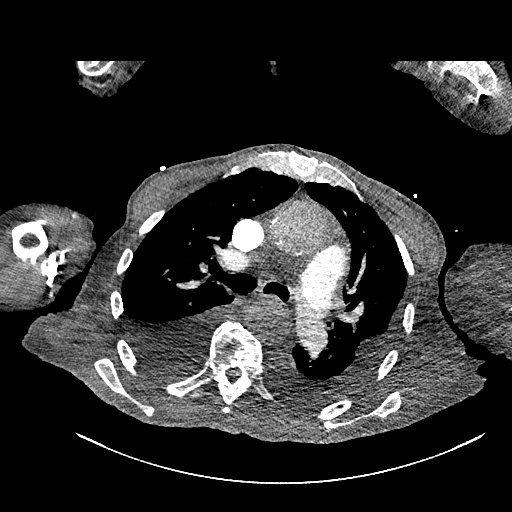
[im 259/432  lung]
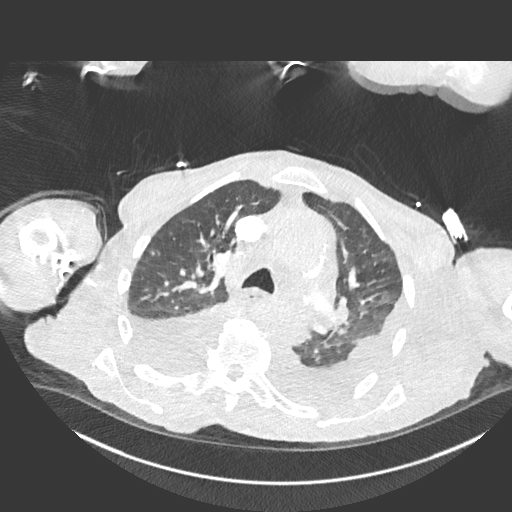
[im 281/432  mediastinal]
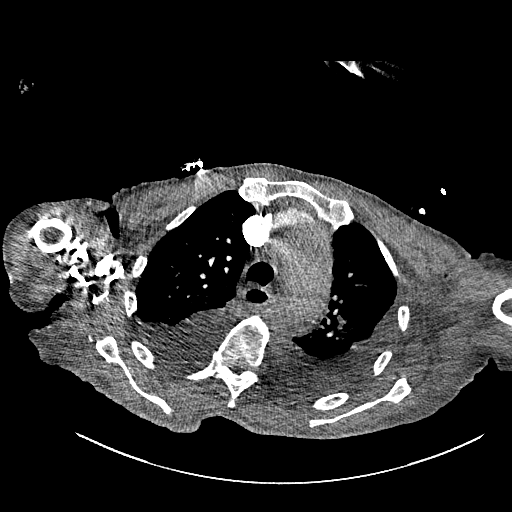
[im 302/432  lung]
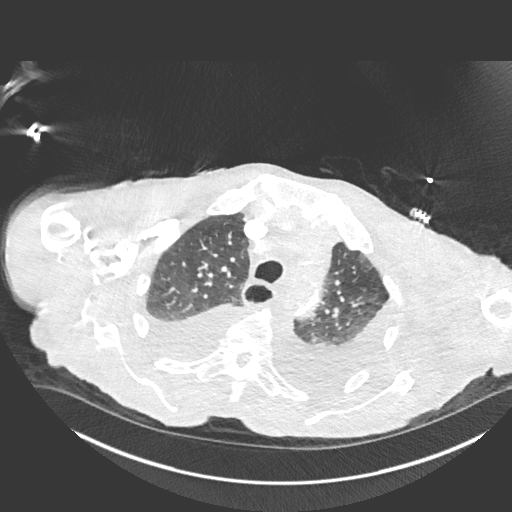
[im 345/432  mediastinal]
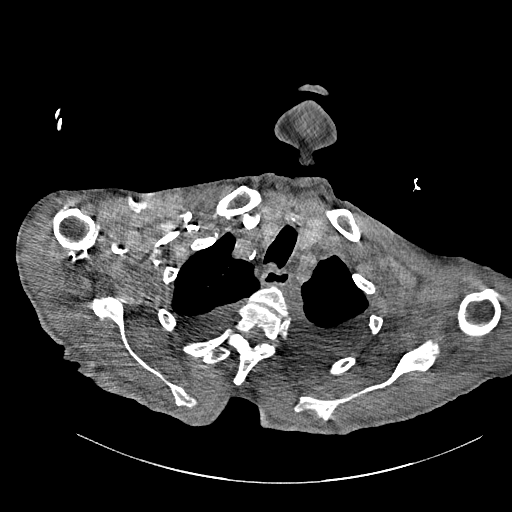
[im 367/432  lung]
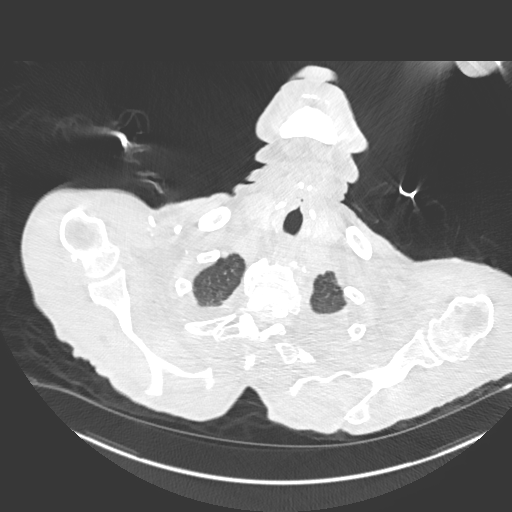
[im 388/432  mediastinal]
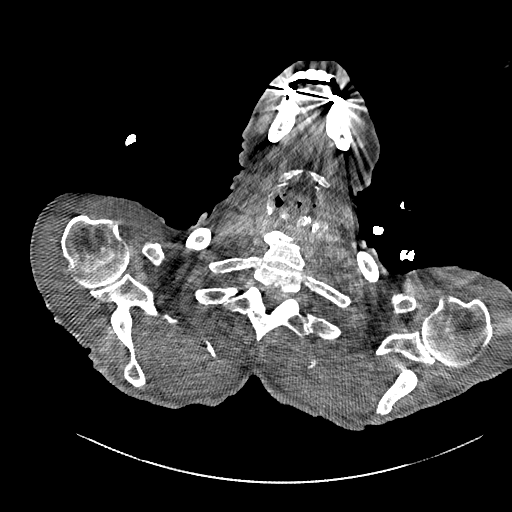
[im 410/432  lung]
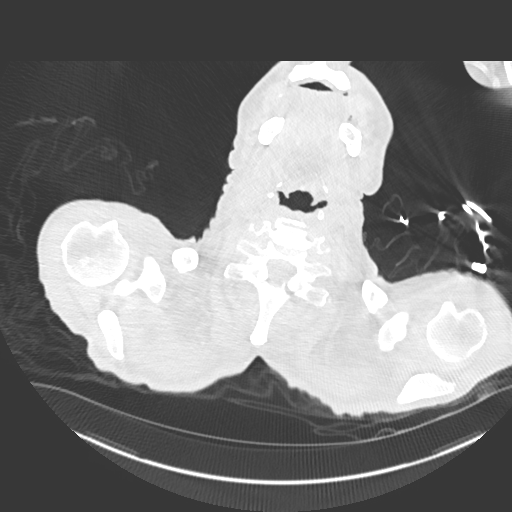

[Series 8: pe 2mm cor · coronal · 0.59mm/px · 1 of 158 slices shown]
[im 79/158  mediastinal]
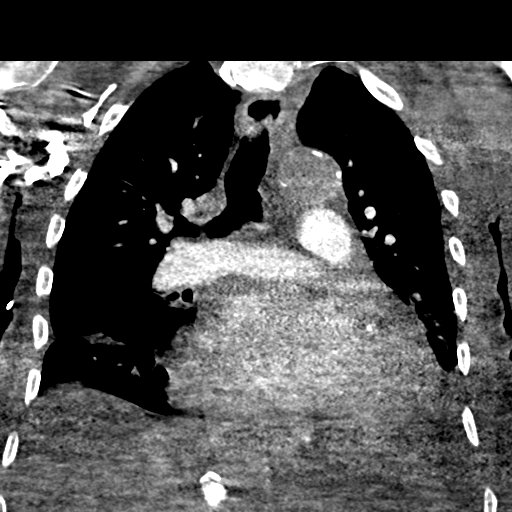

[18 of 36 positions shown; findings below may reference images not displayed]

RADIATION DOSE REDUCTION: This exam was performed according to the
departmental dose-optimization program which includes automated
exposure control, adjustment of the mA and/or kV according to
patient size and/or use of iterative reconstruction technique.

CONTRAST:  75mL OMNIPAQUE IOHEXOL 350 MG/ML SOLN
FINDINGS: Cardiovascular: Moderate cardiomegaly with right atrial enlargement.
Mild calcified plaque over the left main and 3 vessel coronary
arteries. Thoracic aorta is normal in caliber. No most of the course
of the descending thoracic aorta is located in the midline just
anterior to the spine. There is mild calcified plaque throughout the
thoracic aorta. There is suboptimal opacification of the pulmonary
arterial system with additional mild image degradation due to arms
along the sides and anterior chest. Although no definitive embolus
is visualized. There are areas that are somewhat equivocal.
Remaining vascular structures are unremarkable.

Mediastinum/Nodes: No hilar or mediastinal adenopathy. Remaining
mediastinal structures are unremarkable.

Lungs/Pleura: Lungs are adequately inflated and demonstrate moderate
size bilateral pleural effusions with associated compressive
atelectasis in the lung bases. Airways are normal.

Upper Abdomen: Calcified plaque over the abdominal aorta. No acute
findings.

Musculoskeletal: Moderate degenerative changes of the spine

Review of the MIP images confirms the above findings.
IMPRESSION: 1. Suboptimal opacification of the pulmonary arterial system with
additional mild image degradation due to arms along the sides and
anterior chest. Although no definitive emboli are visualized,
findings are somewhat equivocal for pulmonary emboli. Consider
repeat exam in 24 hours.
2. Moderate size bilateral pleural effusions with associated
compressive atelectasis in the lung bases.
3. Moderate cardiomegaly with right atrial enlargement.
4. Aortic atherosclerosis. Atherosclerotic coronary artery disease.

Aortic Atherosclerosis ([id number]).
